# Patient Record
Sex: Male | Born: 1945 | Race: Black or African American | Hispanic: No | Marital: Single | Smoking: Current every day smoker
Health system: Southern US, Community
[De-identification: ages and names within clinical notes are randomized; demographics above are authoritative.]

## PROBLEM LIST (undated history)

## (undated) DIAGNOSIS — I4901 Ventricular fibrillation: Secondary | ICD-10-CM

## (undated) DIAGNOSIS — F99 Mental disorder, not otherwise specified: Secondary | ICD-10-CM

## (undated) DIAGNOSIS — I5022 Chronic systolic (congestive) heart failure: Secondary | ICD-10-CM

## (undated) DIAGNOSIS — Z5900 Homelessness unspecified: Secondary | ICD-10-CM

## (undated) DIAGNOSIS — I10 Essential (primary) hypertension: Secondary | ICD-10-CM

## (undated) DIAGNOSIS — E785 Hyperlipidemia, unspecified: Secondary | ICD-10-CM

## (undated) DIAGNOSIS — I4891 Unspecified atrial fibrillation: Secondary | ICD-10-CM

## (undated) DIAGNOSIS — I2510 Atherosclerotic heart disease of native coronary artery without angina pectoris: Secondary | ICD-10-CM

## (undated) DIAGNOSIS — F1910 Other psychoactive substance abuse, uncomplicated: Secondary | ICD-10-CM

## (undated) DIAGNOSIS — I255 Ischemic cardiomyopathy: Secondary | ICD-10-CM

## (undated) HISTORY — DX: Hyperlipidemia, unspecified: E78.5

## (undated) HISTORY — DX: Atherosclerotic heart disease of native coronary artery without angina pectoris: I25.10

## (undated) HISTORY — DX: Homelessness unspecified: Z59.00

## (undated) HISTORY — DX: Chronic systolic (congestive) heart failure: I50.22

## (undated) HISTORY — DX: Essential (primary) hypertension: I10

## (undated) HISTORY — DX: Ventricular fibrillation: I49.01

## (undated) HISTORY — DX: Unspecified atrial fibrillation: I48.91

## (undated) HISTORY — DX: Mental disorder, not otherwise specified: F99

## (undated) HISTORY — DX: Homelessness: Z59.0

---

## 1999-12-20 ENCOUNTER — Emergency Department (HOSPITAL_COMMUNITY): Admission: EM | Admit: 1999-12-20 | Discharge: 1999-12-20 | Payer: Self-pay | Admitting: Emergency Medicine

## 2005-07-30 ENCOUNTER — Emergency Department (HOSPITAL_COMMUNITY): Admission: EM | Admit: 2005-07-30 | Discharge: 2005-07-30 | Payer: Self-pay | Admitting: Emergency Medicine

## 2005-09-07 ENCOUNTER — Emergency Department (HOSPITAL_COMMUNITY): Admission: EM | Admit: 2005-09-07 | Discharge: 2005-09-07 | Payer: Self-pay | Admitting: Emergency Medicine

## 2005-09-16 ENCOUNTER — Emergency Department (HOSPITAL_COMMUNITY): Admission: EM | Admit: 2005-09-16 | Discharge: 2005-09-16 | Payer: Self-pay | Admitting: Emergency Medicine

## 2005-10-22 ENCOUNTER — Encounter: Admission: RE | Admit: 2005-10-22 | Discharge: 2006-01-20 | Payer: Self-pay | Admitting: Family Medicine

## 2007-06-08 ENCOUNTER — Emergency Department (HOSPITAL_COMMUNITY): Admission: EM | Admit: 2007-06-08 | Discharge: 2007-06-08 | Payer: Self-pay | Admitting: Emergency Medicine

## 2009-07-20 ENCOUNTER — Emergency Department (HOSPITAL_COMMUNITY): Admission: EM | Admit: 2009-07-20 | Discharge: 2009-07-20 | Payer: Self-pay | Admitting: Emergency Medicine

## 2011-10-09 LAB — URINALYSIS, ROUTINE W REFLEX MICROSCOPIC
Bilirubin Urine: NEGATIVE
Glucose, UA: NEGATIVE
Hgb urine dipstick: NEGATIVE
Ketones, ur: NEGATIVE
Nitrite: NEGATIVE
Protein, ur: NEGATIVE
Specific Gravity, Urine: 1.008
pH: 6

## 2013-03-23 ENCOUNTER — Emergency Department (HOSPITAL_COMMUNITY)
Admission: EM | Admit: 2013-03-23 | Discharge: 2013-03-23 | Discharge status: Against Medical Advice | Payer: Medicare Other | Attending: Emergency Medicine | Admitting: Emergency Medicine

## 2013-03-23 ENCOUNTER — Encounter (HOSPITAL_COMMUNITY): Payer: Self-pay | Admitting: Emergency Medicine

## 2013-03-23 DIAGNOSIS — M25579 Pain in unspecified ankle and joints of unspecified foot: Secondary | ICD-10-CM | POA: Insufficient documentation

## 2013-03-23 DIAGNOSIS — L97509 Non-pressure chronic ulcer of other part of unspecified foot with unspecified severity: Secondary | ICD-10-CM | POA: Insufficient documentation

## 2013-03-23 DIAGNOSIS — IMO0002 Reserved for concepts with insufficient information to code with codable children: Secondary | ICD-10-CM | POA: Insufficient documentation

## 2013-03-23 NOTE — ED Notes (Signed)
Pt left without being seen.

## 2013-03-23 NOTE — ED Notes (Signed)
Attempted to get vitals and pt stated "Ill leave man Ill leave."

## 2013-03-23 NOTE — ED Notes (Signed)
Pt refuses to give history.  Pt is restless, agitated. C/o pain in bilateral lower extremities. Ulcer noted on right great toe.

## 2013-03-23 NOTE — ED Notes (Signed)
Pt was picked up by EMS on side of road. C/o foot pain.

## 2014-04-11 ENCOUNTER — Ambulatory Visit: Payer: Self-pay | Admitting: Podiatry

## 2014-10-27 ENCOUNTER — Other Ambulatory Visit: Payer: Self-pay

## 2014-10-27 ENCOUNTER — Encounter (HOSPITAL_COMMUNITY): Payer: Self-pay

## 2014-10-27 ENCOUNTER — Emergency Department (HOSPITAL_COMMUNITY): Payer: Medicare Other

## 2014-10-27 ENCOUNTER — Inpatient Hospital Stay (HOSPITAL_COMMUNITY): Payer: Medicare Other

## 2014-10-27 ENCOUNTER — Inpatient Hospital Stay (HOSPITAL_COMMUNITY)
Admission: EM | Admit: 2014-10-27 | Discharge: 2014-11-05 | DRG: 250 | Disposition: A | Discharge status: Skilled Nursing Facility | Payer: Medicare Other | Attending: Internal Medicine | Admitting: Internal Medicine

## 2014-10-27 ENCOUNTER — Other Ambulatory Visit (HOSPITAL_COMMUNITY): Payer: Self-pay

## 2014-10-27 ENCOUNTER — Encounter (HOSPITAL_COMMUNITY)
Admission: EM | Disposition: A | Discharge status: Skilled Nursing Facility | Payer: Medicare Other | Source: Home / Self Care | Attending: Cardiology

## 2014-10-27 DIAGNOSIS — Z8674 Personal history of sudden cardiac arrest: Secondary | ICD-10-CM

## 2014-10-27 DIAGNOSIS — R7303 Prediabetes: Secondary | ICD-10-CM

## 2014-10-27 DIAGNOSIS — I255 Ischemic cardiomyopathy: Secondary | ICD-10-CM | POA: Diagnosis present

## 2014-10-27 DIAGNOSIS — W1839XA Other fall on same level, initial encounter: Secondary | ICD-10-CM | POA: Diagnosis present

## 2014-10-27 DIAGNOSIS — E876 Hypokalemia: Secondary | ICD-10-CM

## 2014-10-27 DIAGNOSIS — R740 Nonspecific elevation of levels of transaminase and lactic acid dehydrogenase [LDH]: Secondary | ICD-10-CM

## 2014-10-27 DIAGNOSIS — F1410 Cocaine abuse, uncomplicated: Secondary | ICD-10-CM | POA: Diagnosis not present

## 2014-10-27 DIAGNOSIS — E870 Hyperosmolality and hypernatremia: Secondary | ICD-10-CM | POA: Diagnosis present

## 2014-10-27 DIAGNOSIS — I2582 Chronic total occlusion of coronary artery: Secondary | ICD-10-CM | POA: Diagnosis not present

## 2014-10-27 DIAGNOSIS — I2111 ST elevation (STEMI) myocardial infarction involving right coronary artery: Secondary | ICD-10-CM

## 2014-10-27 DIAGNOSIS — D62 Acute posthemorrhagic anemia: Secondary | ICD-10-CM | POA: Diagnosis not present

## 2014-10-27 DIAGNOSIS — Y92230 Patient room in hospital as the place of occurrence of the external cause: Secondary | ICD-10-CM

## 2014-10-27 DIAGNOSIS — E785 Hyperlipidemia, unspecified: Secondary | ICD-10-CM | POA: Diagnosis present

## 2014-10-27 DIAGNOSIS — R451 Restlessness and agitation: Secondary | ICD-10-CM | POA: Diagnosis present

## 2014-10-27 DIAGNOSIS — I10 Essential (primary) hypertension: Secondary | ICD-10-CM | POA: Insufficient documentation

## 2014-10-27 DIAGNOSIS — J9690 Respiratory failure, unspecified, unspecified whether with hypoxia or hypercapnia: Secondary | ICD-10-CM

## 2014-10-27 DIAGNOSIS — R410 Disorientation, unspecified: Secondary | ICD-10-CM | POA: Diagnosis not present

## 2014-10-27 DIAGNOSIS — K7200 Acute and subacute hepatic failure without coma: Secondary | ICD-10-CM | POA: Diagnosis not present

## 2014-10-27 DIAGNOSIS — Z9119 Patient's noncompliance with other medical treatment and regimen: Secondary | ICD-10-CM | POA: Diagnosis not present

## 2014-10-27 DIAGNOSIS — K567 Ileus, unspecified: Secondary | ICD-10-CM | POA: Diagnosis not present

## 2014-10-27 DIAGNOSIS — J9601 Acute respiratory failure with hypoxia: Secondary | ICD-10-CM | POA: Diagnosis present

## 2014-10-27 DIAGNOSIS — I213 ST elevation (STEMI) myocardial infarction of unspecified site: Secondary | ICD-10-CM

## 2014-10-27 DIAGNOSIS — E873 Alkalosis: Secondary | ICD-10-CM | POA: Diagnosis not present

## 2014-10-27 DIAGNOSIS — I4901 Ventricular fibrillation: Secondary | ICD-10-CM | POA: Diagnosis present

## 2014-10-27 DIAGNOSIS — J690 Pneumonitis due to inhalation of food and vomit: Secondary | ICD-10-CM | POA: Diagnosis not present

## 2014-10-27 DIAGNOSIS — I5043 Acute on chronic combined systolic (congestive) and diastolic (congestive) heart failure: Secondary | ICD-10-CM | POA: Diagnosis not present

## 2014-10-27 DIAGNOSIS — Z6820 Body mass index (BMI) 20.0-20.9, adult: Secondary | ICD-10-CM | POA: Diagnosis not present

## 2014-10-27 DIAGNOSIS — R509 Fever, unspecified: Secondary | ICD-10-CM

## 2014-10-27 DIAGNOSIS — R739 Hyperglycemia, unspecified: Secondary | ICD-10-CM | POA: Diagnosis present

## 2014-10-27 DIAGNOSIS — I480 Paroxysmal atrial fibrillation: Secondary | ICD-10-CM | POA: Insufficient documentation

## 2014-10-27 DIAGNOSIS — J811 Chronic pulmonary edema: Secondary | ICD-10-CM

## 2014-10-27 DIAGNOSIS — I469 Cardiac arrest, cause unspecified: Secondary | ICD-10-CM | POA: Insufficient documentation

## 2014-10-27 DIAGNOSIS — Z87891 Personal history of nicotine dependence: Secondary | ICD-10-CM | POA: Diagnosis not present

## 2014-10-27 DIAGNOSIS — R64 Cachexia: Secondary | ICD-10-CM | POA: Diagnosis not present

## 2014-10-27 DIAGNOSIS — G931 Anoxic brain damage, not elsewhere classified: Secondary | ICD-10-CM | POA: Diagnosis not present

## 2014-10-27 DIAGNOSIS — R7401 Elevation of levels of liver transaminase levels: Secondary | ICD-10-CM

## 2014-10-27 DIAGNOSIS — E43 Unspecified severe protein-calorie malnutrition: Secondary | ICD-10-CM | POA: Insufficient documentation

## 2014-10-27 DIAGNOSIS — R109 Unspecified abdominal pain: Secondary | ICD-10-CM | POA: Insufficient documentation

## 2014-10-27 DIAGNOSIS — I4891 Unspecified atrial fibrillation: Secondary | ICD-10-CM

## 2014-10-27 DIAGNOSIS — Z9861 Coronary angioplasty status: Secondary | ICD-10-CM

## 2014-10-27 DIAGNOSIS — G9349 Other encephalopathy: Secondary | ICD-10-CM | POA: Diagnosis not present

## 2014-10-27 DIAGNOSIS — I2119 ST elevation (STEMI) myocardial infarction involving other coronary artery of inferior wall: Secondary | ICD-10-CM | POA: Diagnosis present

## 2014-10-27 DIAGNOSIS — I2510 Atherosclerotic heart disease of native coronary artery without angina pectoris: Secondary | ICD-10-CM | POA: Diagnosis not present

## 2014-10-27 DIAGNOSIS — Z4659 Encounter for fitting and adjustment of other gastrointestinal appliance and device: Secondary | ICD-10-CM

## 2014-10-27 DIAGNOSIS — G9340 Encephalopathy, unspecified: Secondary | ICD-10-CM

## 2014-10-27 DIAGNOSIS — I5042 Chronic combined systolic (congestive) and diastolic (congestive) heart failure: Secondary | ICD-10-CM

## 2014-10-27 DIAGNOSIS — J189 Pneumonia, unspecified organism: Secondary | ICD-10-CM | POA: Insufficient documentation

## 2014-10-27 HISTORY — PX: LEFT HEART CATHETERIZATION WITH CORONARY ANGIOGRAM: SHX5451

## 2014-10-27 LAB — RAPID URINE DRUG SCREEN, HOSP PERFORMED
Amphetamines: NOT DETECTED
BARBITURATES: NOT DETECTED
BENZODIAZEPINES: POSITIVE — AB
COCAINE: POSITIVE — AB
OPIATES: NOT DETECTED
Tetrahydrocannabinol: NOT DETECTED

## 2014-10-27 LAB — POCT I-STAT 3, ART BLOOD GAS (G3+)
Acid-Base Excess: 3 mmol/L — ABNORMAL HIGH (ref 0.0–2.0)
Bicarbonate: 29.4 mEq/L — ABNORMAL HIGH (ref 20.0–24.0)
O2 SAT: 100 %
PH ART: 7.343 — AB (ref 7.350–7.450)
TCO2: 31 mmol/L (ref 0–100)
pCO2 arterial: 54.1 mmHg — ABNORMAL HIGH (ref 35.0–45.0)
pO2, Arterial: 532 mmHg — ABNORMAL HIGH (ref 80.0–100.0)

## 2014-10-27 LAB — BASIC METABOLIC PANEL
ANION GAP: 13 (ref 5–15)
ANION GAP: 12 (ref 5–15)
Anion gap: 13 (ref 5–15)
Anion gap: 19 — ABNORMAL HIGH (ref 5–15)
BUN: 11 mg/dL (ref 6–23)
BUN: 11 mg/dL (ref 6–23)
BUN: 10 mg/dL (ref 6–23)
BUN: 10 mg/dL (ref 6–23)
CALCIUM: 9.1 mg/dL (ref 8.4–10.5)
CALCIUM: 9 mg/dL (ref 8.4–10.5)
CALCIUM: 8.6 mg/dL (ref 8.4–10.5)
CHLORIDE: 102 meq/L (ref 96–112)
CO2: 24 mEq/L (ref 19–32)
CO2: 22 mEq/L (ref 19–32)
CO2: 25 meq/L (ref 19–32)
CO2: 24 meq/L (ref 19–32)
CREATININE: 0.79 mg/dL (ref 0.50–1.35)
CREATININE: 0.81 mg/dL (ref 0.50–1.35)
CREATININE: 0.68 mg/dL (ref 0.50–1.35)
Calcium: 8.2 mg/dL — ABNORMAL LOW (ref 8.4–10.5)
Chloride: 105 mEq/L (ref 96–112)
Chloride: 100 mEq/L (ref 96–112)
Chloride: 102 mEq/L (ref 96–112)
Creatinine, Ser: 0.74 mg/dL (ref 0.50–1.35)
GFR calc Af Amer: >90 mL/min (ref >90)
GFR calc non Af Amer: >90 mL/min (ref >90)
Glucose, Bld: 159 mg/dL — ABNORMAL HIGH (ref 70–99)
Glucose, Bld: 164 mg/dL — ABNORMAL HIGH (ref 70–99)
Glucose, Bld: 161 mg/dL — ABNORMAL HIGH (ref 70–99)
Glucose, Bld: 138 mg/dL — ABNORMAL HIGH (ref 70–99)
Potassium: 3.4 mEq/L — ABNORMAL LOW (ref 3.7–5.3)
Potassium: 3.9 mEq/L (ref 3.7–5.3)
Potassium: 3.8 mEq/L (ref 3.7–5.3)
Potassium: 3.8 mEq/L (ref 3.7–5.3)
Sodium: 142 mEq/L (ref 137–147)
Sodium: 141 mEq/L (ref 137–147)
Sodium: 140 mEq/L (ref 137–147)
Sodium: 138 mEq/L (ref 137–147)

## 2014-10-27 LAB — PROTIME-INR
INR: 1.12 (ref 0.00–1.49)
INR: 1.49 (ref 0.00–1.49)
INR: 1.11 (ref 0.00–1.49)
PROTHROMBIN TIME: 14.4 s (ref 11.6–15.2)
Prothrombin Time: 14.5 seconds (ref 11.6–15.2)
Prothrombin Time: 18.1 seconds — ABNORMAL HIGH (ref 11.6–15.2)

## 2014-10-27 LAB — COMPREHENSIVE METABOLIC PANEL
ALBUMIN: 3.7 g/dL (ref 3.5–5.2)
ALK PHOS: 103 U/L (ref 39–117)
ALT: 108 U/L — AB (ref 0–53)
ANION GAP: 22 — AB (ref 5–15)
AST: 147 U/L — ABNORMAL HIGH (ref 0–37)
BILIRUBIN TOTAL: 1.1 mg/dL (ref 0.3–1.2)
BUN: 12 mg/dL (ref 6–23)
CHLORIDE: 103 meq/L (ref 96–112)
CO2: 18 mEq/L — ABNORMAL LOW (ref 19–32)
Calcium: 9.2 mg/dL (ref 8.4–10.5)
Creatinine, Ser: 0.96 mg/dL (ref 0.50–1.35)
Glucose, Bld: 130 mg/dL — ABNORMAL HIGH (ref 70–99)
POTASSIUM: 3.4 meq/L — AB (ref 3.7–5.3)
SODIUM: 143 meq/L (ref 137–147)
Total Protein: 7.1 g/dL (ref 6.0–8.3)

## 2014-10-27 LAB — POCT I-STAT, CHEM 8
BUN: 11 mg/dL (ref 6–23)
BUN: 8 mg/dL (ref 6–23)
BUN: 7 mg/dL (ref 6–23)
CHLORIDE: 111 meq/L (ref 96–112)
CREATININE: 0.8 mg/dL (ref 0.50–1.35)
CREATININE: 0.6 mg/dL (ref 0.50–1.35)
Calcium, Ion: 1.23 mmol/L (ref 1.13–1.30)
Calcium, Ion: 0.93 mmol/L — ABNORMAL LOW (ref 1.13–1.30)
Calcium, Ion: 0.94 mmol/L — ABNORMAL LOW (ref 1.13–1.30)
Chloride: 103 mEq/L (ref 96–112)
Chloride: 109 mEq/L (ref 96–112)
Creatinine, Ser: 0.6 mg/dL (ref 0.50–1.35)
GLUCOSE: 140 mg/dL — AB (ref 70–99)
Glucose, Bld: 150 mg/dL — ABNORMAL HIGH (ref 70–99)
Glucose, Bld: 133 mg/dL — ABNORMAL HIGH (ref 70–99)
HCT: 40 % (ref 39.0–52.0)
HEMATOCRIT: 40 % (ref 39.0–52.0)
HEMATOCRIT: 31 % — AB (ref 39.0–52.0)
HEMOGLOBIN: 13.6 g/dL (ref 13.0–17.0)
Hemoglobin: 10.5 g/dL — ABNORMAL LOW (ref 13.0–17.0)
Hemoglobin: 13.6 g/dL (ref 13.0–17.0)
POTASSIUM: 2.6 meq/L — AB (ref 3.7–5.3)
Potassium: 3.5 mEq/L — ABNORMAL LOW (ref 3.7–5.3)
Potassium: 2.5 mEq/L — CL (ref 3.7–5.3)
SODIUM: 142 meq/L (ref 137–147)
SODIUM: 148 meq/L — AB (ref 137–147)
Sodium: 145 mEq/L (ref 137–147)
TCO2: 26 mmol/L (ref 0–100)
TCO2: 20 mmol/L (ref 0–100)
TCO2: 18 mmol/L (ref 0–100)

## 2014-10-27 LAB — I-STAT TROPONIN, ED: Troponin i, poc: 0.05 ng/mL (ref 0.00–0.08)

## 2014-10-27 LAB — URINALYSIS, ROUTINE W REFLEX MICROSCOPIC
Bilirubin Urine: NEGATIVE
Glucose, UA: NEGATIVE mg/dL
KETONES UR: NEGATIVE mg/dL
Leukocytes, UA: NEGATIVE
Nitrite: NEGATIVE
PH: 6 (ref 5.0–8.0)
PROTEIN: 100 mg/dL — AB
Specific Gravity, Urine: 1.027 (ref 1.005–1.030)
Urobilinogen, UA: 1 mg/dL (ref 0.0–1.0)

## 2014-10-27 LAB — CBC
HEMATOCRIT: 40.4 % (ref 39.0–52.0)
HEMOGLOBIN: 13.4 g/dL (ref 13.0–17.0)
MCH: 28.8 pg (ref 26.0–34.0)
MCHC: 33.2 g/dL (ref 30.0–36.0)
MCV: 86.9 fL (ref 78.0–100.0)
Platelets: 197 10*3/uL (ref 150–400)
RBC: 4.65 MIL/uL (ref 4.22–5.81)
RDW: 14.1 % (ref 11.5–15.5)
WBC: 4.8 10*3/uL (ref 4.0–10.5)

## 2014-10-27 LAB — POCT I-STAT TROPONIN I: TROPONIN I, POC: 0.05 ng/mL (ref 0.00–0.08)

## 2014-10-27 LAB — URINE MICROSCOPIC-ADD ON

## 2014-10-27 LAB — TROPONIN I
TROPONIN I: 2.15 ng/mL — AB (ref <0.30)
Troponin I: 2.3 ng/mL (ref <0.30)

## 2014-10-27 LAB — APTT
APTT: 33 s (ref 24–37)
APTT: 84 s — AB (ref 24–37)
APTT: 40 s — AB (ref 24–37)

## 2014-10-27 LAB — PLATELET COUNT: Platelets: 219 10*3/uL (ref 150–400)

## 2014-10-27 LAB — LACTIC ACID, PLASMA: Lactic Acid, Venous: 1.9 mmol/L (ref 0.5–2.2)

## 2014-10-27 LAB — MRSA PCR SCREENING: MRSA by PCR: NEGATIVE

## 2014-10-27 LAB — POCT ACTIVATED CLOTTING TIME: Activated Clotting Time: 597 seconds

## 2014-10-27 SURGERY — LEFT HEART CATHETERIZATION WITH CORONARY ANGIOGRAM
Anesthesia: LOCAL

## 2014-10-27 MED ORDER — METOPROLOL TARTRATE 1 MG/ML IV SOLN
INTRAVENOUS | Status: AC
  Filled 2014-10-27: qty 5

## 2014-10-27 MED ORDER — CHLORHEXIDINE GLUCONATE 0.12 % MT SOLN
OROMUCOSAL | Status: AC
  Administered 2014-10-27: 15 mL
  Filled 2014-10-27: qty 15

## 2014-10-27 MED ORDER — CHLORHEXIDINE GLUCONATE 0.12 % MT SOLN
15.0000 mL | Freq: Two times a day (BID) | OROMUCOSAL | Status: DC
  Administered 2014-10-27 – 2014-10-30 (×7): 15 mL via OROMUCOSAL
  Filled 2014-10-27 (×7): qty 15

## 2014-10-27 MED ORDER — SODIUM CHLORIDE 0.9 % IV SOLN
25.0000 ug/h | INTRAVENOUS | Status: DC
  Administered 2014-10-27: 200 ug/h via INTRAVENOUS
  Administered 2014-10-28: 250 ug/h via INTRAVENOUS
  Administered 2014-10-29: 50 ug/h via INTRAVENOUS
  Filled 2014-10-27 (×3): qty 50

## 2014-10-27 MED ORDER — POTASSIUM CHLORIDE 2 MEQ/ML IV SOLN
INTRAVENOUS | Status: DC
  Administered 2014-10-27: 18:00:00 via INTRAVENOUS
  Filled 2014-10-27 (×4): qty 1000

## 2014-10-27 MED ORDER — NOREPINEPHRINE BITARTRATE 1 MG/ML IV SOLN
0.0000 ug/min | INTRAVENOUS | Status: DC
  Filled 2014-10-27: qty 16

## 2014-10-27 MED ORDER — FENTANYL BOLUS VIA INFUSION
50.0000 ug | INTRAVENOUS | Status: DC | PRN
  Filled 2014-10-27: qty 50

## 2014-10-27 MED ORDER — SODIUM CHLORIDE 0.9 % IV SOLN
2000.0000 mL | Freq: Once | INTRAVENOUS | Status: AC
  Administered 2014-10-27: 1000 mL via INTRAVENOUS

## 2014-10-27 MED ORDER — FENTANYL CITRATE 0.05 MG/ML IJ SOLN: 100.0000 ug | Freq: Once | INTRAMUSCULAR | Status: DC

## 2014-10-27 MED ORDER — HEPARIN (PORCINE) IN NACL 2-0.9 UNIT/ML-% IJ SOLN
INTRAMUSCULAR | Status: AC
  Filled 2014-10-27: qty 1500

## 2014-10-27 MED ORDER — SODIUM CHLORIDE 0.9 % IV SOLN
INTRAVENOUS | Status: DC
  Administered 2014-10-27: 14:00:00 via INTRAVENOUS

## 2014-10-27 MED ORDER — NITROGLYCERIN 1 MG/10 ML FOR IR/CATH LAB
INTRA_ARTERIAL | Status: AC
  Filled 2014-10-27: qty 10

## 2014-10-27 MED ORDER — ASPIRIN 300 MG RE SUPP
300.0000 mg | RECTAL | Status: AC
  Administered 2014-10-27: 300 mg via RECTAL
  Filled 2014-10-27: qty 1

## 2014-10-27 MED ORDER — ASPIRIN 81 MG PO CHEW: 81.0000 mg | CHEWABLE_TABLET | Freq: Every day | ORAL | Status: DC

## 2014-10-27 MED ORDER — HYDRALAZINE HCL 20 MG/ML IJ SOLN
10.0000 mg | INTRAMUSCULAR | Status: DC | PRN
  Administered 2014-10-27 – 2014-10-30 (×4): 10 mg via INTRAVENOUS
  Filled 2014-10-27 (×4): qty 1

## 2014-10-27 MED ORDER — TIROFIBAN HCL IV 12.5 MG/250 ML
INTRAVENOUS | Status: AC
  Administered 2014-10-28: 0.15 ug/kg/min via INTRAVENOUS
  Filled 2014-10-27: qty 250

## 2014-10-27 MED ORDER — MIDAZOLAM HCL 2 MG/2ML IJ SOLN
INTRAMUSCULAR | Status: AC
  Filled 2014-10-27: qty 2

## 2014-10-27 MED ORDER — ASPIRIN 300 MG RE SUPP
300.0000 mg | Freq: Once | RECTAL | Status: AC
  Administered 2014-10-27: 300 mg via RECTAL
  Filled 2014-10-27: qty 1

## 2014-10-27 MED ORDER — HYDRALAZINE HCL 20 MG/ML IJ SOLN
INTRAMUSCULAR | Status: AC
Start: 2014-10-27 — End: 2014-10-27
  Administered 2014-10-29: 10 mg via INTRAVENOUS
  Filled 2014-10-27: qty 1

## 2014-10-27 MED ORDER — BIVALIRUDIN 250 MG IV SOLR
INTRAVENOUS | Status: AC
  Filled 2014-10-27: qty 250

## 2014-10-27 MED ORDER — POTASSIUM CHLORIDE 10 MEQ/100ML IV SOLN
10.0000 meq | INTRAVENOUS | Status: AC
  Administered 2014-10-27 (×4): 10 meq via INTRAVENOUS
  Filled 2014-10-27 (×4): qty 100

## 2014-10-27 MED ORDER — NOREPINEPHRINE BITARTRATE 1 MG/ML IV SOLN
0.5000 ug/min | INTRAVENOUS | Status: DC
  Filled 2014-10-27: qty 4

## 2014-10-27 MED ORDER — CISATRACURIUM BESYLATE 10 MG/ML IV SOLN
1.0000 ug/kg/min | INTRAVENOUS | Status: DC
  Administered 2014-10-27 – 2014-10-28 (×2): 1.5 ug/kg/min via INTRAVENOUS
  Filled 2014-10-27 (×4): qty 20

## 2014-10-27 MED ORDER — FENTANYL CITRATE 0.05 MG/ML IJ SOLN
25.0000 ug/h | INTRAMUSCULAR | Status: DC
  Filled 2014-10-27: qty 50

## 2014-10-27 MED ORDER — ATORVASTATIN CALCIUM 80 MG PO TABS
80.0000 mg | ORAL_TABLET | Freq: Every day | ORAL | Status: DC
  Administered 2014-10-27: 80 mg via NASOGASTRIC
  Filled 2014-10-27: qty 1

## 2014-10-27 MED ORDER — MIDAZOLAM HCL 2 MG/2ML IJ SOLN
2.0000 mg | Freq: Once | INTRAMUSCULAR | Status: AC
  Administered 2014-10-27: 2 mg via INTRAVENOUS

## 2014-10-27 MED ORDER — SODIUM CHLORIDE 0.9 % IV SOLN: 2000.0000 mL | Freq: Once | INTRAVENOUS | Status: DC

## 2014-10-27 MED ORDER — CETYLPYRIDINIUM CHLORIDE 0.05 % MT LIQD
7.0000 mL | Freq: Four times a day (QID) | OROMUCOSAL | Status: DC
  Administered 2014-10-27 – 2014-10-30 (×12): 7 mL via OROMUCOSAL

## 2014-10-27 MED ORDER — FENTANYL CITRATE 0.05 MG/ML IJ SOLN: 100.0000 ug | INTRAMUSCULAR | Status: DC | PRN

## 2014-10-27 MED ORDER — TICAGRELOR 90 MG PO TABS: 90.0000 mg | ORAL_TABLET | Freq: Two times a day (BID) | ORAL | Status: DC

## 2014-10-27 MED ORDER — MIDAZOLAM HCL 5 MG/ML IJ SOLN
1.0000 mg/h | INTRAMUSCULAR | Status: DC
  Filled 2014-10-27: qty 10

## 2014-10-27 MED ORDER — PANTOPRAZOLE SODIUM 40 MG IV SOLR
40.0000 mg | Freq: Every day | INTRAVENOUS | Status: DC
  Administered 2014-10-27 – 2014-10-31 (×5): 40 mg via INTRAVENOUS
  Filled 2014-10-27 (×7): qty 40

## 2014-10-27 MED ORDER — TIROFIBAN HCL IV 5 MG/100ML
0.1500 ug/kg/min | INTRAVENOUS | Status: DC
  Administered 2014-10-27 – 2014-10-28 (×2): 0.15 ug/kg/min via INTRAVENOUS
  Filled 2014-10-27 (×4): qty 100

## 2014-10-27 MED ORDER — METOPROLOL TARTRATE 1 MG/ML IV SOLN
5.0000 mg | Freq: Three times a day (TID) | INTRAVENOUS | Status: DC
  Administered 2014-10-27 – 2014-10-28 (×3): 5 mg via INTRAVENOUS
  Filled 2014-10-27 (×3): qty 5

## 2014-10-27 MED ORDER — POTASSIUM CHLORIDE 10 MEQ/50ML IV SOLN: 10.0000 meq | INTRAVENOUS | Status: DC

## 2014-10-27 MED ORDER — MIDAZOLAM HCL 2 MG/2ML IJ SOLN: 2.0000 mg | Freq: Once | INTRAMUSCULAR | Status: DC

## 2014-10-27 MED ORDER — LIDOCAINE HCL (PF) 1 % IJ SOLN
INTRAMUSCULAR | Status: AC
  Filled 2014-10-27: qty 30

## 2014-10-27 MED ORDER — SODIUM CHLORIDE 0.9 % IV SOLN
1.0000 mg/h | INTRAVENOUS | Status: DC
  Administered 2014-10-27: 6 mg/h via INTRAVENOUS
  Administered 2014-10-27: 8 mg/h via INTRAVENOUS
  Administered 2014-10-28: 6 mg/h via INTRAVENOUS
  Filled 2014-10-27 (×4): qty 10

## 2014-10-27 MED ORDER — MIDAZOLAM BOLUS VIA INFUSION
2.0000 mg | INTRAVENOUS | Status: DC | PRN
  Filled 2014-10-27: qty 2

## 2014-10-27 MED ORDER — ARTIFICIAL TEARS OP OINT
1.0000 "application " | TOPICAL_OINTMENT | Freq: Three times a day (TID) | OPHTHALMIC | Status: DC
  Administered 2014-10-27 – 2014-10-28 (×4): 1 via OPHTHALMIC
  Filled 2014-10-27: qty 3.5

## 2014-10-27 MED ORDER — TICAGRELOR 90 MG PO TABS
180.0000 mg | ORAL_TABLET | Freq: Once | ORAL | Status: AC
  Administered 2014-10-27: 180 mg via NASOGASTRIC
  Filled 2014-10-27: qty 2

## 2014-10-27 MED ORDER — HEPARIN SODIUM (PORCINE) 5000 UNIT/ML IJ SOLN
4000.0000 [IU] | Freq: Once | INTRAMUSCULAR | Status: AC
  Administered 2014-10-27: 4000 [IU] via INTRAVENOUS
  Filled 2014-10-27: qty 1

## 2014-10-27 NOTE — ED Provider Notes (Signed)
Level V caveat history is obtained from EMS. Patient collapsed at home a shelter. Was without CPR for approximately 15 minutes. First responders perform CPR. AED was placed on patient which shocked him twice. Paramedics arrived to find patient in ventricular fibrillation. EMS intubated patient with 7.0 ET tube and deep fibrillated patient twice. He was placed on the [place] device, [place] device was not needed. He was also administered Versed 2 mg IV post intubation. On exam patient unresponsive Glasgow Coma Score 3 HEENT exam normocephalic atraumatic area eyes pupils pinpoint bilaterally neck supple positive JVD lungs clear auscultation heart regular rate and rhythm abdomen nondistended nontender all 4 extremities without tenderness, . Peripheral pulses intact. Skin warm dry. Neurologic Glasgow Coma Score 3. Unresponsive. Prehospital 12-lead EKG:  Date: 10/27/2014  Rate: 195  Rhythm: normal sinus rhythm  QRS Axis: normal  Intervals: normal  ST/T Wave abnormalities: ST elevations inferiorly  Conduction Disutrbances:none  Narrative Interpretation:   Old EKG Reviewed: none available  Code STEMI called. Aspirin rectally ordered 300 mg. IV bolus of heparin 4000 units ordered. Code hypothermia called as patient unresponsive Patient taken to the cardiac catheterization laboratory Diagnosis #1 cardiopulmonary arrest #2 acute inferior wall myocardial infarction(STEMI). CRITICAL CARE Performed by: Doug SouJACUBOWITZ,Ranjit Ashurst Total critical care time: 30 minute Critical care time was exclusive of separately billable procedures and treating other patients. Critical care was necessary to treat or prevent imminent or life-threatening deterioration. Critical care was time spent personally by me on the following activities: development of treatment plan with patient and/or surrogate as well as nursing, discussions with consultants, evaluation of patient's response to treatment, examination of patient, obtaining history from  patient or surrogate, ordering and performing treatments and interventions, ordering and review of laboratory studies, ordering and review of radiographic studies, pulse oximetry and re-evaluation of patient's condition.  Note:EDEKG:1324 pm  Date: 10/27/2014  Rate: 75  Rhythm: normal sinus rhythm  QRS Axis: normal  Intervals: normal  ST/T Wave abnormalities: nonspecific T wave changes  Conduction Disutrbances:none  Narrative Interpretation:   Old EKG Reviewed: ST segment elevation in prehospital 12-lead has resolved   Doug SouSam Ky Rumple, MD 10/27/14 1329

## 2014-10-27 NOTE — CV Procedure (Signed)
Cardiac Catheterization Operative Report  Jodc Doe [id number] 11/5/20153:18 PM No primary care provider on file.  Procedure Performed:  1. Left Heart Catheterization 2. Selective Coronary Angiography 3. Left ventricular angiogram 4. PTCA with balloon angioplasty only of the mid and distal RCA 5. Angioseal Right femoral artery  Operator: Verne Carrowhristopher Kaelee Pfeffer, MD  Indication:  ? Yo male, homeless with no known pmh or family contacts Jonny Ruiz(John Doe) with ventricular fibrillation arrest at the homeless shelter today. EMS arrived and performed one shock with return to sinus. ST elevation on EKG in ED. Emergent cath.                                 Procedure Details: Emergency consent obtained. The patient was further sedated with Versed and Fentanyl. Paralytics given. Pt intubated in field. The right groin was prepped and draped in the usual manner. Using the modified Seldinger access technique, a 6 French sheath was placed in the right femoral artery. Standard diagnostic catheters were used to perform selective coronary angiography. RCA with sub-total distal occlusion felt to be culprit lesion. I elected to proceed to PCI of the RCA.   PCI Note: Angiomax weight based bolus given and drip started. Aggrastat bolus given and drip started. JR4 guide to engage RCA. Cougar IC wire down RCA but could not cross the distal lesion. Whisper wire down the RCA and into the PDA. 2.0 x 15 mm balloon into the mid vessel and inflated x 2 but balloon would not expand due to calcification. I could not pass the balloon into the distal vessel. I then passed a 1.5 x 12 mm balloon into the distal vessel and inflated x 3. I could not pass a 2.0 balloon into the distal vessel. I then placed a [place] guide extension device into the proximal RCA and used the 2.0 balloon to try and pull the Guidezilla down into the proximal vessel. I was unable to deliver the balloon even with the support of the [place]. I had flow  into the distal vessel and stopped the case. I was not able to deliver any stents due to the severe tortuosity and calcification of the vessel.    A pigtail catheter was used to perform a left ventricular angiogram. There were no immediate complications. The patient was taken to the CCU in stable condition.   Hemodynamic Findings: Central aortic pressure: 180/108 Left ventricular pressure: 177/3/10  Angiographic Findings:  Left main: No obstructive disease.   Left Anterior Descending Artery: Large caliber vessel that courses to the apex. Several diagonal branches. Mild diffuse disease. No focally obstructive disease.   Circumflex Artery: Large vessel with large OM branch. Mild diffuse disease.   Right Coronary Artery: Large dominant vessel with diffuse heavy calcification in the proximal, mid and distal vessel. The proximal vessel has serial 50% stenoses. The mid vessel has diffuse 95% stenosis. The distal vessel has a 99% sub-total occlusion. The PDA fills faintly from antegrade flow and is also seen to fill from left to right collaterals.   Left Ventricular Angiogram: LVEF=25-30% with inferior hypokinesis.   Impression: 1. Inferior STEMI secondary to occluded distal RCA 2. Out of hospital cardiac arrest (venticular fibrillation) 3. Diffuse heavy calcification in the entire RCA with multiple segments of severe stenosis.  4. Severe LV systolic dysfunction  Recommendations: He will be managed in the CCU. To head CT now per PCCM. If no IC bleeding, plans for cooling  protocol. In regards to his cardiac disease, will run Aggrastat drip for 18 hours. Will load Brilinta 180 mg x 1 per NG tube. Will start Brilinta 90 mg po BID after that with first dose tonight. Will start statin, beta blocker (tachycardia and HTN). Continue ASA. Echo tomorrow. Ventilator management per PCCM. Long term plans for management of the RCA are complex. I was able to restore antegrade flow down the vessel with balloon  inflations but could not fully inflate balloons in the mid segment due to heavy calcification. If he has full neurological recovery, can consider PCI of the RCA with rotablator atherectomy, however, the downside to this would be the need for a very long segment of overlapping stents in a patient who at this time may not have the ability to remain compliant with medical therapy. This would be high risk for stent thrombosis.        Complications:  None. The patient tolerated the procedure well.

## 2014-10-27 NOTE — ED Notes (Signed)
Called [facility] to activate code stemi  

## 2014-10-27 NOTE — Progress Notes (Signed)
ETT advance 1 cm to 27 per MD order

## 2014-10-27 NOTE — ED Notes (Signed)
Per [facility], pt was witnessed fall at homeless shelter and went unresponsive. Fire arrived and started CPR. Pt shocked 2x with AED and twice with EMS. Pt was in vfib on arrival by EMS. Has been in SR since being shocked. Was given 2.5 mg versed. IO to left tibia. 140/90 and 35 capnography.

## 2014-10-27 NOTE — Progress Notes (Signed)
eLink Physician-Brief Progress Note Patient Name: [name] DOB: 12/23/1873 MRN: [id number]   Date of Service  10/27/2014  HPI/Events of Note   BP 217/124.   eICU Interventions  Will order prn hydralazine IV.     Intervention Category Major Interventions: Other:  Abrar Bilton 10/27/2014, 4:42 PM

## 2014-10-27 NOTE — ED Provider Notes (Signed)
CSN: [id number]     Arrival date & time 10/27/14  1310 History   First MD Initiated Contact with Patient 10/27/14 1319     Chief Complaint  Patient presents with  . Code STEMI    Patient is a [age] y.o. unknown presenting with altered mental status. The history is provided by the EMS personnel.  Altered Mental Status Presenting symptoms: unresponsiveness   Severity:  Severe Most recent episode:  Today Episode history:  Single Timing:  Constant Progression:  Unchanged Chronicity:  New Context comment:  Unknown Associated symptoms comment:  Syncope, V-fib arrest   Unknown past medical history  Unknown past surgical history  Unknown family history  History  Substance Use Topics  . Smoking status: Unknown If Ever Smoked  . Smokeless tobacco: Not on file  . Alcohol Use: Not on file   OB History    No data available     Review of Systems  Unable to perform ROS: Patient unresponsive    Allergies  Review of patient's allergies indicates not on file.  Home Medications   Prior to Admission medications   Not on File   BP 120/86 mmHg  Resp 16  SpO2 100%   Physical Exam  Constitutional: He appears well-developed and well-nourished. He is intubated. Backboard in place.  HENT:  Head: Normocephalic and atraumatic.  Right Ear: External ear normal.  Left Ear: External ear normal.  Nose: Nose normal.  Cardiovascular: Normal rate and regular rhythm.   Pulmonary/Chest: Effort normal and breath sounds normal. He is intubated. No respiratory distress. He has no wheezes.  Mechanically ventilated  Abdominal: Soft. He exhibits no distension.  Neurological: He is unresponsive.  Moving mouth on ETT; moving RUE spontaneously; coughing with deep suction  Skin: Skin is warm and dry. No rash noted.    ED Course  Procedures  Labs Review Labs Reviewed  CULTURE, BLOOD (ROUTINE X 2)  CULTURE, BLOOD (ROUTINE X 2)  URINE CULTURE  CULTURE, RESPIRATORY (NON-EXPECTORATED)  APTT  CBC   COMPREHENSIVE METABOLIC PANEL  PROTIME-INR  BLOOD GAS, ARTERIAL  BASIC METABOLIC PANEL  BASIC METABOLIC PANEL  BASIC METABOLIC PANEL  BASIC METABOLIC PANEL  BASIC METABOLIC PANEL  BASIC METABOLIC PANEL  BASIC METABOLIC PANEL  PROTIME-INR  PROTIME-INR  APTT  APTT  CORTISOL  URINALYSIS, ROUTINE W REFLEX MICROSCOPIC  STREP PNEUMONIAE URINARY ANTIGEN  LEGIONELLA ANTIGEN, URINE  PROCALCITONIN  LACTIC ACID, PLASMA  I-STAT TROPOININ, ED  POCT I-STAT TROPONIN I  I-STAT ARTERIAL BLOOD GAS, ED    Imaging Review Dg Chest Portable 1 View  10/27/2014   CLINICAL DATA:  Weakness cardiac arrest.  Unresponsive.  EXAM: PORTABLE CHEST - 1 VIEW  COMPARISON:  None.  FINDINGS: Endotracheal tube with tip near the clavicular heads. An orogastric tube reaches the stomach.  There is no edema, consolidation, effusion, or pneumothorax. Thin line left lateral to the descending thoracic aorta favors minimal atelectasis. No convincing pneumomediastinum.  No visible rib fracture.  IMPRESSION: 1. Endotracheal and orogastric tubes are in good position. 2. No cardiomegaly or pulmonary edema.   Electronically Signed   By: Tiburcio PeaJonathan  Watts M.D.   On: 10/27/2014 13:48    Pre-Hospital EKG showed inferior STEMI in II, III, aVF with reciprocal changes   MDM   Final diagnoses:  STEMI (ST elevation myocardial infarction)    Unknown age male, appears elderly, presents to the ED post V-fib arrest and cardiopulmonary resuscitation. Received a total of 4 cardioversions. Pre-hospital EKG showed inferior STEMI. Intubated in the  field. Code STEMI called. Rectal asa and heparin ordered. He was taken emergently to the cath lab. His hemodynamics remained acceptable in the ED. He was given a dose of Versed for sedation.   This case managed in conjunction with my attending, Dr. Ethelda ChickJacubowitz.     Maxine GlennAnn Yailene Badia, MD 10/27/14 1429  Doug SouSam Jacubowitz, MD 10/27/14 1645  Doug SouSam Jacubowitz, MD 10/27/14 [id number]

## 2014-10-27 NOTE — Consult Note (Signed)
      Jodc Doe is an [age] y.o. unknown.   Chief Complaint: VF arrest HPI:   The patient is a thin AA male with unknown medical history who had a witnessed arrest in a homeless shelter.  He was down for 5-10 minutes prior to CPR and defibrillation.  He was shocked a total of four times.  Intubated and on a vent currently.  EKG shows inferior ST elevation with reciprocal changes in V3-5     PMH:  Not able to obtain due to medical condition   Family history:  Not able to obtain due to medical condition  Social History:  Not able to obtain due to medical condition  Allergies: Not on File   (Not in a hospital admission)  No results found for this or any previous visit (from the past 48 hour(s)). No results found.  Review of Systems  Unable to perform ROS: medical condition    Blood pressure 110/76, pulse 83, temperature 96.9 F (36.1 C), temperature source Temporal, resp. rate 16, SpO2 100 %. Physical Exam  Constitutional:  Thin appearing AA male  Cardiovascular: Normal rate, regular rhythm, S1 normal and S2 normal.   No murmur heard. Pulses:      Radial pulses are 2+ on the right side, and 2+ on the left side.       Dorsalis pedis pulses are 2+ on the right side, and 2+ on the left side.  Respiratory: Effort normal and breath sounds normal. He has no rales.  GI: Soft. Bowel sounds are normal.  Musculoskeletal: He exhibits no edema.  Neurological:  Intubated and sedated  Skin: Skin is warm and dry.     Assessment/Plan VF Cardiac arrest EKG with ST changes consistent with myocardial injury.  The patient was taken emergently to the cath lab.   STEMI   HAGER, BRYAN, PAC 10/27/2014, 1:36 PM  Personally seen and examined. Agree with above.  Male unknown age, likely in his 2360s/70s who had witnessed cardiac arrest at homeless shelter. EMS was called immediately and responded within 5-10 minutes and AED demonstrated ventricular fibrillation and he was defibrillated twice.  He was brought to the emergency department for further evaluation where EKG post arrest demonstrated ST segment elevation in the inferior leads with deep T-wave inversion in V4 through V6. Because of this, he was brought to the catheterization lab for further evaluation. In the emergency room, blood pressure in the 120s, he was spontaneously moving his arms slightly. Biting down on ET tube. Unfortunately, he does not have any information identifying himself.  Critical care team has been consult as well. We will place on cooling protocol.  After catheterization, we'll place on secondary prevention medication, beta blocker, statin, aspirin, Plavix as indicated.  Echocardiogram to check ejection fraction. Brief bedside echocardiogram performed in the emergency department appear to demonstrate normal ejection fraction.  Critical care time 40 minutes spent with communication between emergency room physician, EMS, interventional cardiology with ongoing stabilization of patient prior to cardiac catheterization.  Donato SchultzSKAINS, Dj Senteno, MD

## 2014-10-27 NOTE — Progress Notes (Signed)
Manual BP results of 180/100 reported to Dr Craige CottaSood.  PRN hydralazine given per MAR.  Dr. Craige CottaSood also notified of unsuccessful attempts of radial A-line placement by RT.

## 2014-10-27 NOTE — Progress Notes (Signed)
Rt tried with mulitple attempts to obtain arterial line with no success. RN notifed MD

## 2014-10-27 NOTE — Progress Notes (Signed)
Rec'd pt in cath lab on vent from ED RT.  Pt taken to CT scan via vent, no apparent complications noted.  Unit RT aware.

## 2014-10-27 NOTE — ED Notes (Signed)
Cardiology at the bedside.

## 2014-10-27 NOTE — Progress Notes (Signed)
E-link notified about large amount of urine output of since 1630.

## 2014-10-27 NOTE — Progress Notes (Signed)
Dr. Craige CottaSood notified of hypertension and the need for placement of arterial line.  Order received to take manual BP and report results.

## 2014-10-27 NOTE — Consult Note (Signed)
PULMONARY / CRITICAL CARE MEDICINE   Name: [name] MRN: [id number] DOB: 12/23/1873    ADMISSION DATE:  10/27/2014   REFERRING MD :  EDP  INITIAL PRESENTATION:  Admitted as [name] after V fib arrest due to STEMI, intubated, post ACLS inc DCCV, emergent cath    SIGNIFICANT EVENTS/STUDIES: 11/05 Vfib arrest with 5 minutes down  11/05 LHC: Inferior STEMI secondary to occluded distal RCA. Diffuse heavy calcification in the entire RCA with multiple segments of severe stenosis. Severe LV systolic dysfunction 11/05 CT head: NAD  INDWELLING DEVICES:: ETT 11/05 >>   MICRO DATA: Urine 11/05 >>  Resp 11/05  >>  Blood 11/05 >>    ANTIMICROBIALS:     HISTORY OF PRESENT ILLNESS:  [name] who walked into homeless shelter 11/5 and had witnessed arrest. Fire reported on scene in 5 minutes with CPR and AED shock x 4 for Vfib with ROSC. [facility] and rushed to Cath lab for urgent LHC for inf/lateral STEMI. PCCM asked to admit.  PAST MEDICAL HISTORY :  N/A  Not on FileNA  FAMILY HISTORY:  NA  SOCIAL HISTORY:  NA  REVIEW OF SYSTEMS:  NA  SUBJECTIVE:   VITAL SIGNS: Temp:  [96.9 F (36.1 C)] 96.9 F (36.1 C) (11/05 1319) Pulse Rate:  [83] 83 (11/05 1330) Resp:  [16] 16 (11/05 1330) BP: (110-120)/(76-86) 110/76 mmHg (11/05 1330) SpO2:  [100 %] 100 % (11/05 1330) FiO2 (%):  [100 %] 100 % (11/05 1319) Weight:  [154 lb 5.2 oz (70 kg)] 154 lb 5.2 oz (70 kg) (11/05 1330) HEMODYNAMICS:   VENTILATOR SETTINGS: Vent Mode:  [-] PRVC FiO2 (%):  [100 %] 100 % Set Rate:  [16 bmp] 16 bmp Vt Set:  [782[620 mL] 620 mL PEEP:  [5 cmH20] 5 cmH20 Plateau Pressure:  [14 cmH20] 14 cmH20 INTAKE / OUTPUT: No intake or output data in the 24 hours ending 10/27/14 1352  PHYSICAL EXAMINATION: General:  Thin, intubated, sedated, paralyzed Neuro: Cannot perform due to NMB HEENT:  NCAT, PERRL Cardiovascular: REG, No M Lungs: clear Abdomen Soft, +BS Ext: warm, no  edema  LABS: I have reviewed all of today's lab results. Relevant abnormalities are discussed in the A/P section  CXR:  NACPD  ASSESSMENT / PLAN:  PULMONARY  A: VDRF post V fi arrest P:   Cont full vent support - settings reviewed and/or adjusted Cont vent bundle Daily SBT if/when meets criteria  CARDIOVASCULAR A:  VF arrest Inf STEMI RCA stenosis, could not be stented Hypertension, controlled P:  Cards managing MAP goal 85 mmHg per hypothermia protocol  RENAL A:   Hypernatremia hypokalemia P:   Monitor BMET intermittently Monitor I/Os Correct electrolytes as indicated D5W + KCl  GASTROINTESTINAL A:   No issues P:   SUP: IV PPI No nutrition until rewarmed  HEMATOLOGIC A:   Mild anemia without overt blood loss P:  DVT px: fully anticoagulated Monitor CBC intermittently Transfuse per usual ICU guidelines   INFECTIOUS A:   No overt infection P:   Micro and abx as above  ENDOCRINE A:  Mild hyperglycemia P:   CBGs per hyperglycemia protocol  NEUROLOGIC A:   Post anoxic acute encephalopathy ICU/vent associated discomfort P:   RASS goal: N/A while paralyzed   FAMILY  - Updates:   - Inter-disciplinary family meet or Palliative Care meeting due by:      TODAY'S SUMMARY:  40 mins CCM time  Scott Fischeravid Calene Paradiso, MD ; PCCM service  Mobile [phone].  After 5:30 PM or weekends, call [phone]  10/27/2014, 1:54 PM

## 2014-10-27 NOTE — Progress Notes (Signed)
Attempted to get Patient for EEG- nurse advised he will be getting [name] now; will follow up in the morning.

## 2014-10-28 ENCOUNTER — Encounter (HOSPITAL_COMMUNITY): Payer: Self-pay | Admitting: *Deleted

## 2014-10-28 ENCOUNTER — Other Ambulatory Visit (HOSPITAL_COMMUNITY): Payer: Self-pay

## 2014-10-28 DIAGNOSIS — I213 ST elevation (STEMI) myocardial infarction of unspecified site: Secondary | ICD-10-CM

## 2014-10-28 DIAGNOSIS — I059 Rheumatic mitral valve disease, unspecified: Secondary | ICD-10-CM

## 2014-10-28 DIAGNOSIS — I2111 ST elevation (STEMI) myocardial infarction involving right coronary artery: Secondary | ICD-10-CM

## 2014-10-28 DIAGNOSIS — J9690 Respiratory failure, unspecified, unspecified whether with hypoxia or hypercapnia: Secondary | ICD-10-CM | POA: Insufficient documentation

## 2014-10-28 DIAGNOSIS — J9600 Acute respiratory failure, unspecified whether with hypoxia or hypercapnia: Secondary | ICD-10-CM

## 2014-10-28 DIAGNOSIS — I2119 ST elevation (STEMI) myocardial infarction involving other coronary artery of inferior wall: Principal | ICD-10-CM

## 2014-10-28 DIAGNOSIS — E43 Unspecified severe protein-calorie malnutrition: Secondary | ICD-10-CM

## 2014-10-28 LAB — BASIC METABOLIC PANEL
ANION GAP: 13 (ref 5–15)
Anion gap: 10 (ref 5–15)
Anion gap: 12 (ref 5–15)
Anion gap: 12 (ref 5–15)
Anion gap: 10 (ref 5–15)
BUN: 9 mg/dL (ref 6–23)
BUN: 11 mg/dL (ref 6–23)
BUN: 11 mg/dL (ref 6–23)
BUN: 11 mg/dL (ref 6–23)
BUN: 11 mg/dL (ref 6–23)
CALCIUM: 7.7 mg/dL — AB (ref 8.4–10.5)
CALCIUM: 9 mg/dL (ref 8.4–10.5)
CALCIUM: 9 mg/dL (ref 8.4–10.5)
CHLORIDE: 101 meq/L (ref 96–112)
CHLORIDE: 101 meq/L (ref 96–112)
CO2: 23 mEq/L (ref 19–32)
CO2: 24 meq/L (ref 19–32)
CO2: 25 meq/L (ref 19–32)
CO2: 24 meq/L (ref 19–32)
CO2: 26 meq/L (ref 19–32)
CREATININE: 0.6 mg/dL (ref 0.50–1.35)
CREATININE: 0.53 mg/dL (ref 0.50–1.35)
CREATININE: 0.53 mg/dL (ref 0.50–1.35)
CREATININE: 0.52 mg/dL (ref 0.50–1.35)
Calcium: 9.1 mg/dL (ref 8.4–10.5)
Calcium: 8.9 mg/dL (ref 8.4–10.5)
Chloride: 108 mEq/L (ref 96–112)
Chloride: 102 mEq/L (ref 96–112)
Chloride: 102 mEq/L (ref 96–112)
Creatinine, Ser: 0.54 mg/dL (ref 0.50–1.35)
GFR calc Af Amer: >90 mL/min (ref >90)
GFR calc Af Amer: >90 mL/min (ref >90)
GFR calc Af Amer: >90 mL/min (ref >90)
GFR calc Af Amer: >90 mL/min (ref >90)
GFR calc Af Amer: >90 mL/min (ref >90)
GFR calc non Af Amer: >90 mL/min (ref >90)
GFR calc non Af Amer: >90 mL/min (ref >90)
GFR calc non Af Amer: >90 mL/min (ref >90)
GFR calc non Af Amer: >90 mL/min (ref >90)
GFR calc non Af Amer: >90 mL/min (ref >90)
GLUCOSE: 127 mg/dL — AB (ref 70–99)
GLUCOSE: 116 mg/dL — AB (ref 70–99)
GLUCOSE: 114 mg/dL — AB (ref 70–99)
GLUCOSE: 114 mg/dL — AB (ref 70–99)
Glucose, Bld: 125 mg/dL — ABNORMAL HIGH (ref 70–99)
Potassium: 4.6 mEq/L (ref 3.7–5.3)
Potassium: 3.8 mEq/L (ref 3.7–5.3)
Potassium: 3.7 mEq/L (ref 3.7–5.3)
Potassium: 3.8 mEq/L (ref 3.7–5.3)
Potassium: 3.9 mEq/L (ref 3.7–5.3)
Sodium: 141 mEq/L (ref 137–147)
Sodium: 139 mEq/L (ref 137–147)
Sodium: 139 mEq/L (ref 137–147)
Sodium: 137 mEq/L (ref 137–147)
Sodium: 137 mEq/L (ref 137–147)

## 2014-10-28 LAB — CBC
HEMATOCRIT: 39.9 % (ref 39.0–52.0)
HEMOGLOBIN: 13.8 g/dL (ref 13.0–17.0)
MCH: 29.1 pg (ref 26.0–34.0)
MCHC: 34.6 g/dL (ref 30.0–36.0)
MCV: 84.2 fL (ref 78.0–100.0)
Platelets: 194 10*3/uL (ref 150–400)
RBC: 4.74 MIL/uL (ref 4.22–5.81)
RDW: 13.8 % (ref 11.5–15.5)
WBC: 6.3 10*3/uL (ref 4.0–10.5)

## 2014-10-28 LAB — GLUCOSE, CAPILLARY
GLUCOSE-CAPILLARY: 108 mg/dL — AB (ref 70–99)
GLUCOSE-CAPILLARY: 121 mg/dL — AB (ref 70–99)
GLUCOSE-CAPILLARY: 130 mg/dL — AB (ref 70–99)
GLUCOSE-CAPILLARY: 146 mg/dL — AB (ref 70–99)
GLUCOSE-CAPILLARY: 151 mg/dL — AB (ref 70–99)
GLUCOSE-CAPILLARY: 306 mg/dL — AB (ref 70–99)
Glucose-Capillary: 112 mg/dL — ABNORMAL HIGH (ref 70–99)
Glucose-Capillary: 127 mg/dL — ABNORMAL HIGH (ref 70–99)
Glucose-Capillary: 145 mg/dL — ABNORMAL HIGH (ref 70–99)
Glucose-Capillary: 149 mg/dL — ABNORMAL HIGH (ref 70–99)
Glucose-Capillary: 169 mg/dL — ABNORMAL HIGH (ref 70–99)
Glucose-Capillary: 128 mg/dL — ABNORMAL HIGH (ref 70–99)
Glucose-Capillary: 115 mg/dL — ABNORMAL HIGH (ref 70–99)
Glucose-Capillary: 116 mg/dL — ABNORMAL HIGH (ref 70–99)
Glucose-Capillary: 135 mg/dL — ABNORMAL HIGH (ref 70–99)
Glucose-Capillary: 111 mg/dL — ABNORMAL HIGH (ref 70–99)
Glucose-Capillary: 123 mg/dL — ABNORMAL HIGH (ref 70–99)

## 2014-10-28 LAB — BLOOD GAS, ARTERIAL
ACID-BASE EXCESS: 0.3 mmol/L (ref 0.0–2.0)
BICARBONATE: 24.2 meq/L — AB (ref 20.0–24.0)
Drawn by: 39898
FIO2: 100 %
MECHVT: 630 mL
O2 SAT: 99.4 %
PEEP: 5 cmH2O
PO2 ART: 351 mmHg — AB (ref 80.0–100.0)
Patient temperature: 91.4
RATE: 14 resp/min
TCO2: 25.3 mmol/L (ref 0–100)
pCO2 arterial: 30.6 mmHg — ABNORMAL LOW (ref 35.0–45.0)
pH, Arterial: 7.488 — ABNORMAL HIGH (ref 7.350–7.450)

## 2014-10-28 LAB — BASIC METABOLIC PANEL WITH GFR
Anion gap: 13 (ref 5–15)
BUN: 10 mg/dL (ref 6–23)
CO2: 24 meq/L (ref 19–32)
Calcium: 9.2 mg/dL (ref 8.4–10.5)
Chloride: 104 meq/L (ref 96–112)
Creatinine, Ser: 0.54 mg/dL (ref 0.50–1.35)
GFR calc Af Amer: >90 mL/min
GFR calc non Af Amer: >90 mL/min
Glucose, Bld: 139 mg/dL — ABNORMAL HIGH (ref 70–99)
Potassium: 4.3 meq/L (ref 3.7–5.3)
Sodium: 141 meq/L (ref 137–147)

## 2014-10-28 LAB — URINE CULTURE
COLONY COUNT: NO GROWTH
CULTURE: NO GROWTH
Special Requests: NORMAL

## 2014-10-28 LAB — TROPONIN I: TROPONIN I: 1.53 ng/mL — AB (ref <0.30)

## 2014-10-28 LAB — CORTISOL: Cortisol, Plasma: 43.3 ug/dL

## 2014-10-28 MED ORDER — WHITE PETROLATUM GEL
Status: AC
  Administered 2014-10-28: 0.2
  Filled 2014-10-28: qty 5

## 2014-10-28 MED ORDER — METOPROLOL TARTRATE 1 MG/ML IV SOLN
5.0000 mg | Freq: Three times a day (TID) | INTRAVENOUS | Status: DC
  Administered 2014-10-29: 5 mg via INTRAVENOUS
  Filled 2014-10-28 (×4): qty 5

## 2014-10-28 MED ORDER — TICAGRELOR 90 MG PO TABS
90.0000 mg | ORAL_TABLET | Freq: Two times a day (BID) | ORAL | Status: DC
  Filled 2014-10-28 (×2): qty 1

## 2014-10-28 MED ORDER — TICAGRELOR 90 MG PO TABS
90.0000 mg | ORAL_TABLET | Freq: Two times a day (BID) | ORAL | Status: DC
  Administered 2014-10-28 – 2014-10-30 (×6): 90 mg
  Filled 2014-10-28 (×8): qty 1

## 2014-10-28 MED ORDER — DEXTROSE-NACL 5-0.45 % IV SOLN
INTRAVENOUS | Status: DC
  Administered 2014-10-28: 19:00:00 via INTRAVENOUS
  Administered 2014-10-30: 75 mL/h via INTRAVENOUS
  Administered 2014-10-31 (×2): via INTRAVENOUS

## 2014-10-28 MED ORDER — ATORVASTATIN CALCIUM 80 MG PO TABS
80.0000 mg | ORAL_TABLET | Freq: Every day | ORAL | Status: DC
  Administered 2014-10-28: 80 mg via NASOGASTRIC
  Filled 2014-10-28 (×2): qty 1

## 2014-10-28 MED ORDER — SODIUM CHLORIDE 0.9 % IV SOLN
INTRAVENOUS | Status: DC | PRN
Start: 2014-10-28 — End: 2014-10-30

## 2014-10-28 MED ORDER — ASPIRIN 81 MG PO CHEW
81.0000 mg | CHEWABLE_TABLET | Freq: Every day | ORAL | Status: DC
  Administered 2014-10-28 – 2014-10-29 (×2): 81 mg via NASOGASTRIC
  Filled 2014-10-28 (×2): qty 1

## 2014-10-28 MED FILL — Sodium Chloride IV Soln 0.9%: INTRAVENOUS | Qty: 50 | Status: AC

## 2014-10-28 NOTE — Plan of Care (Signed)
Problem: Phase I Progression Outcomes Goal: Cardiology consult Outcome: Completed/Met Date Met:  10/28/14 Goal: Meds per hypothermia orders Outcome: Completed/Met Date Met:  10/28/14 Goal: Cool target temp reached 2 hrs from start Outcome: Completed/Met Date Met:  10/28/14 Goal: No shivering or vent asynchrony Outcome: Completed/Met Date Met:  10/28/14 Goal: Maintain MAP > 85 mmHg Outcome: Completed/Met Date Met:  10/28/14 Goal: Aspirin unless contraindicated Outcome: Completed/Met Date Met:  10/28/14 Goal: If STEMI, emergency Cardiac Cath Outcome: Completed/Met Date Met:  10/28/14 Goal: Electrolytes normal or corrected Outcome: Completed/Met Date Met:  10/28/14 Goal: Other Phase I Outcomes/Goals Outcome: Completed/Met Date Met:  10/28/14  Problem: Phase II Progression Outcomes Goal: Active warming per orders Outcome: Progressing

## 2014-10-28 NOTE — Progress Notes (Addendum)
SUBJECTIVE:  Pt seen and examined in AM. No acute events on telemetry overnight. Pt remains intubated and sedated on hypothermic protocol with stable vital signs. He is status post emergent cardiac catherization one day ago revealing occluded distal RCA,  diffuse heavy calcification in the entire RCA with multiple segments of severe stenosis, and severe left ventricular systolic dysfunction.   OBJECTIVE:   Vitals:   Filed Vitals:   10/28/14 0900 10/28/14 1000 10/28/14 1100 10/28/14 1124  BP: 98/66  119/78 110/73  Pulse: 72 71 72 73  Temp: 91.4 F (33 C) 91.4 F (33 C) 91.4 F (33 C)   TempSrc: Core (Comment) Core (Comment) Core (Comment) Core (Comment)  Resp: 10 10 10 10   Height:      Weight:      SpO2: 98% 99% 100% 99%   I&O's:   Intake/Output Summary (Last 24 hours) at 10/28/14 1142 Last data filed at 10/28/14 1100  Gross per 24 hour  Intake 3257.1 ml  Output   7375 ml  Net -4117.9 ml   TELEMETRY: Reviewed telemetry pt in NSR and at times sinus bradycardia:     PHYSICAL EXAM General: Intubated and under sedation Head:   Normal cephalic and atramatic  Lungs:  Ronchi in anterior fields. Heart:  HRRR S1 S2  No JVD.   Abdomen: Abdomen soft and non-tender Msk:  Back normal,  Normal strength and tone for age. Extremities:  Trace b/l LE edema.  Right groin with CDI dressing. Neuro:  Sedated Skin: No rash   LABS: Basic Metabolic Panel:  Recent Labs  16/09/9610/06/15 0600 10/28/14 0954  NA 141 139  K 4.3 3.8  CL 104 102  CO2 24 24  GLUCOSE 139* 127*  BUN 10 11  CREATININE 0.54 0.53  CALCIUM 9.2 9.1   Liver Function Tests:  Recent Labs  10/27/14 1320  AST 147*  ALT 108*  ALKPHOS 103  BILITOT 1.1  PROT 7.1  ALBUMIN 3.7   No results for input(s): LIPASE, AMYLASE in the last 72 hours. CBC:  Recent Labs  10/27/14 1320  10/27/14 1809 10/27/14 2200 10/28/14 0600  WBC 4.8  --   --   --  6.3  HGB 13.4  < > 13.6  --  13.8  HCT 40.4  < > 40.0  --  39.9    MCV 86.9  --   --   --  84.2  PLT 197  --   --  219 194  < > = values in this interval not displayed. Cardiac Enzymes:  Recent Labs  10/27/14 1611 10/27/14 2145 10/28/14 0406  TROPONINI 2.30* 2.15* 1.53*   BNP: Invalid input(s): POCBNP D-Dimer: No results for input(s): DDIMER in the last 72 hours. Hemoglobin A1C: No results for input(s): HGBA1C in the last 72 hours. Fasting Lipid Panel: No results for input(s): CHOL, HDL, LDLCALC, TRIG, CHOLHDL, LDLDIRECT in the last 72 hours. Thyroid Function Tests: No results for input(s): TSH, T4TOTAL, T3FREE, THYROIDAB in the last 72 hours.  Invalid input(s): FREET3 Anemia Panel: No results for input(s): VITAMINB12, FOLATE, FERRITIN, TIBC, IRON, RETICCTPCT in the last 72 hours. Coag Panel:   Lab Results  Component Value Date   INR 1.11 10/27/2014   INR 1.49 10/27/2014   INR 1.12 10/27/2014    RADIOLOGY: Ct Head Wo Contrast  10/27/2014   CLINICAL DATA:  Cardiac arrest.  EXAM: CT HEAD WITHOUT CONTRAST  TECHNIQUE: Contiguous axial images were obtained from the base of the skull through the  vertex without intravenous contrast.  COMPARISON:  None.  FINDINGS: Bony calvarium appears intact. No mass effect or midline shift is noted. Ventricular size is within normal limits. There is no evidence of mass lesion, hemorrhage or acute infarction.  IMPRESSION: Normal head CT.   Electronically Signed   By: Roque LiasJames  Green M.D.   On: 10/27/2014 16:04   Dg Chest Portable 1 View  10/27/2014   CLINICAL DATA:  Weakness cardiac arrest.  Unresponsive.  EXAM: PORTABLE CHEST - 1 VIEW  COMPARISON:  None.  FINDINGS: Endotracheal tube with tip near the clavicular heads. An orogastric tube reaches the stomach.  There is no edema, consolidation, effusion, or pneumothorax. Thin line left lateral to the descending thoracic aorta favors minimal atelectasis. No convincing pneumomediastinum.  No visible rib fracture.  IMPRESSION: 1. Endotracheal and orogastric tubes are in  good position. 2. No cardiomegaly or pulmonary edema.   Electronically Signed   By: Tiburcio PeaJonathan  Watts M.D.   On: 10/27/2014 13:48      ASSESSMENT: 68-yo homeless man found down for 5-10 minutes found to have vfib arrest in setting of inferior STEMI that was resuscitated and underwent emergent cardiac catherization found to have and severe LV systolic dysfunction 25-30%.  PLAN:    Acute Inferior STEMI with Vfib Arrest in setting of occluded distal RCA and cocaine use - Currently intubated and sedated under hypothermic protocol. Pt is s/p PTCA with balloon angioplasty of the mid and distal RCA. Currently off aggrastat infusion and on brillinta 90 mg BID, aspirin 81 mg daily, lopressor 5 mg Q 8hr, lipitor 80 mg daily. Once able to assess neurological status and if full recovery, may consider PCI of RCA due to diffuse heavy calcification in the entire RCA with multiple segments of severe stenosis, however risk of stent thrombosis in setting of probable noncompliant patient is high. Obtain lipid panel and A1c. Ventilator Dependent Respiratory Failure - Currently intubated. PCCM managing. Severe Left Ventricular Systolic Dysfunction - EF 25-30% on catherization. 2D-echo pending. Currently on IV lopressor 5 mg Q 8hr, lipitor 80 mg daily. Transaminititis - AST/ALT elevated most likely in setting of ischemia. Obtain acute hepatitis panel and HIV Ab. Continue to monitor. Hypokalemia - Resolved s/p IV potassium chloride. Continue to monitor. Substance Abuse - UDS positive for cocaine and benzodiazepine.   Otis BraceABBANI, MARJAN, MD  10/28/2014  11:42 AM   I have examined the patient and reviewed assessment and plan and discussed with patient.  Agree with above as stated.  Supportive care.  Discussed findings with the patient's brother.  Await to see about neurologic recovery.  S/p POBA of distal RCA.  Rhythm has been stable.  EF decreased.  WIll need medical therapy for this once he wakes up.   Complex patient with  guarded prognosis.  Favorable for him is that down time apparently was short.  Will know more after rewarming.  Reine Bristow S.

## 2014-10-28 NOTE — Progress Notes (Signed)
Moderate amount milky white discharge noted from urethra. Peri/foley care provided multiple times throughout shift with immediate return of discharge. Will continue to monitor.

## 2014-10-28 NOTE — Progress Notes (Signed)
UR Completed.  Cynethia Schindler Jane [phone] 10/28/2014  

## 2014-10-28 NOTE — Progress Notes (Signed)
INITIAL NUTRITION ASSESSMENT  Pt meets criteria for SEVERE MALNUTRITION in the context of chronic as evidenced by severe fat and muscle depletion.  DOCUMENTATION CODES Per approved criteria  -Severe malnutrition in the context of chronic illness   INTERVENTION: If pt remains intubated and off hypothermia protocol recommend:  Initiate Vital AF 1.2 @ 20 ml/hr via OG tube and increase by 10 ml every 12 hours to goal rate of 60 ml/hr.   Monitor magnesium, potassium, and phosphorus daily for at least 3 days, MD to replete as needed, as pt is at risk for refeeding syndrome given severe malnutrition.  MVI daily  Tube feeding regimen provides 1728 kcal (103% of needs), 108 grams of protein, and 1167 ml of H2O.   NUTRITION DIAGNOSIS: Inadequate oral intake related to inability to eat as evidenced by NPO status  Goal: Pt to meet >/= 90% of their estimated nutrition needs   Monitor:  Respiratory status, TF initiation and tolerance, weight trends,   Reason for Assessment: Ventilator   68 y.o. male  Admitting Dx: Vfib arrest   ASSESSMENT: Pt had witnessed v fib arrest after entering homeless shelter. [facility]. Pt positive for cocaine and benzos on admission.   Patient is currently intubated on ventilator support MV: 7 L/min Temp (24hrs), Avg:91.6 F (33.1 C), Min:90.5 F (32.5 C), Max:96.9 F (36.1 C)  Propofol: none  Nutrition Focused Physical Exam:  Subcutaneous Fat:  Orbital Region: WDL Upper Arm Region: severe depletion  Thoracic and Lumbar Region: NA  Muscle:  Temple Region: severe depletion  Clavicle Bone Region: severe depletion  Clavicle and Acromion Bone Region: severe depletion  Scapular Bone Region: severe depletion Dorsal Hand: severe depletion  Patellar Region: NA Anterior Thigh Region: NA Posterior Calf Region: moderate depletion   Edema: not present   Height: Ht Readings from Last 1 Encounters:  10/27/14 6\' 1"  (1.854  m)    Weight: Wt Readings from Last 1 Encounters:  10/28/14 158 lb 1.1 oz (71.7 kg)    Ideal Body Weight: 83.6 kg   % Ideal Body Weight: 86%  Wt Readings from Last 10 Encounters:  10/28/14 158 lb 1.1 oz (71.7 kg)    Usual Body Weight: unknown  % Usual Body Weight: -  BMI:  Body mass index is 20.86 kg/(m^2).  Estimated Nutritional Needs: Kcal: 1677 Protein: 95-110 grams Fluid: > 1.6 l/day  Skin: diabetic toe ulcer  Diet Order: Diet NPO time specified  EDUCATION NEEDS: -No education needs identified at this time   Intake/Output Summary (Last 24 hours) at 10/28/14 1248 Last data filed at 10/28/14 1100  Gross per 24 hour  Intake 3257.1 ml  Output   7375 ml  Net -4117.9 ml    Last BM: PTA   Labs:   Recent Labs Lab 10/28/14 0211 10/28/14 0600 10/28/14 0954  NA 141 141 139  K 4.6 4.3 3.8  CL 108 104 102  CO2 23 24 24   BUN 9 10 11   CREATININE 0.60 0.54 0.53  CALCIUM 7.7* 9.2 9.1  GLUCOSE 125* 139* 127*    CBG (last 3)   Recent Labs  10/28/14 0556 10/28/14 0708 10/28/14 0821  GLUCAP 146* 115* 116*    Scheduled Meds: . antiseptic oral rinse  7 mL Mouth Rinse QID  . artificial tears  1 application Both Eyes 3 times per day  . aspirin  81 mg Per NG tube Daily  . atorvastatin  80 mg Per NG tube q1800  .  chlorhexidine  15 mL Mouth Rinse BID  . fentaNYL  100 mcg Intravenous Once  . metoprolol  5 mg Intravenous 3 times per day  . midazolam  2 mg Intravenous Once  . pantoprazole (PROTONIX) IV  40 mg Intravenous QHS  . ticagrelor  90 mg Per Tube BID  . white petrolatum        Continuous Infusions: . sodium chloride 20 mL/hr at 10/27/14 1900  . cisatracurium (NIMBEX) infusion 1.5 mcg/kg/min (10/28/14 0800)  . dextrose 5 % and 0.45% NaCl 50 mL/hr at 10/28/14 1048  . fentaNYL infusion INTRAVENOUS 200 mcg/hr (10/28/14 0800)  . midazolam (VERSED) infusion 6 mg/hr (10/28/14 0800)  . norepinephrine (LEVOPHED) Adult infusion      History reviewed.  No pertinent past medical history.  History reviewed. No pertinent past surgical history.  Kendell BaneHeather Ricci Dirocco RD, LDN, CNSC [phone] Pager [phone] After Hours Pager

## 2014-10-28 NOTE — Progress Notes (Signed)
eLink Physician-Brief Progress Note Patient Name: [name] DOB: 1946/06/28 MRN: [id number]   Date of Service  10/28/2014  HPI/Events of Note  resp alkalosis  eICU Interventions  Decrease RR 10, FiO2 40%     Intervention Category Major Interventions: Acid-Base disturbance - evaluation and management  MCQUAID, DOUGLAS 10/28/2014, 5:20 AM

## 2014-10-28 NOTE — Progress Notes (Signed)
Echocardiogram 2D Echocardiogram has been performed.  [name] 10/28/2014, 11:22 AM

## 2014-10-28 NOTE — Procedures (Signed)
Arterial Catheter Insertion Procedure Note [name] [id number] Nov 30, 1946  Procedure: Insertion of Arterial Catheter  Indications: Blood pressure monitoring and Frequent blood sampling  Procedure Details Consent: Risks of procedure as well as the alternatives and risks of each were explained to the (patient/caregiver).  Consent for procedure obtained. and Unable to obtain consent because of emergent medical necessity. Time Out: Verified patient identification, verified procedure, site/side was marked, verified correct patient position, special equipment/implants available, medications/allergies/relevent history reviewed, required imaging and test results available.  Performed  Maximum sterile technique was used including antiseptics, gloves, gown, hand hygiene, mask and sheet. Skin prep: Chlorhexidine; local anesthetic administered 20 gauge catheter was inserted into left radial artery using the Seldinger technique.  Evaluation Blood flow poor; BP tracing good. Complications: No apparent complications.   Leafy HalfSnider, [name] 10/28/2014

## 2014-10-28 NOTE — Progress Notes (Addendum)
PULMONARY / CRITICAL CARE MEDICINE   Name: Smith MinceJames Bobby MRN: [id number] DOB: 1946/09/26    ADMISSION DATE:  10/27/2014   REFERRING MD :  EDP  INITIAL PRESENTATION:  Ad[facility] as [name] from homeless shelter after witnessed V fib arrest due to STEMI, intubated, post ACLS inc DCCV, emergent cath   SIGNIFICANT EVENTS/STUDIES: 11/05 Vfib arrest with 5- 15 minutes down time. DCCV X 4 reported 11/05 LHC: Inferior STEMI secondary to occluded distal RCA. Diffuse heavy calcification in the entire RCA with multiple segments of severe stenosis. Severe LV systolic dysfunction 11/05 CT head: NAD 11/06 TTE:  11/06 EEG:   INDWELLING DEVICES:: ETT 11/05 >>  L radial A line 11/05 >> R femoral venous sheath 11/05 >>   MICRO DATA: Urine 11/05 >>  Resp 11/05  >>  Blood 11/05 >>   ANTIMICROBIALS: none   SUBJECTIVE: Intubated/Sedated. Remains on hypothermia protocol.  VITAL SIGNS: Temp:  [90.5 F (32.5 C)-96.9 F (36.1 C)] 91.4 F (33 C) (11/06 1100) Pulse Rate:  [38-84] 73 (11/06 1124) Resp:  [7-16] 10 (11/06 1124) BP: (98-220)/(66-136) 110/73 mmHg (11/06 1124) SpO2:  [98 %-100 %] 99 % (11/06 1124) Arterial Line BP: (101-135)/(68-94) 118/77 mmHg (11/06 1100) FiO2 (%):  [40 %-100 %] 40 % (11/06 1124) Weight:  [154 lb 5.2 oz (70 kg)-158 lb 1.1 oz (71.7 kg)] 158 lb 1.1 oz (71.7 kg) (11/06 0500) HEMODYNAMICS:   VENTILATOR SETTINGS: Vent Mode:  [-] PRVC FiO2 (%):  [40 %-100 %] 40 % Set Rate:  [10 bmp-16 bmp] 14 bmp Vt Set:  [500 mL-630 mL] 500 mL PEEP:  [4 cmH20-5 cmH20] 5 cmH20 Plateau Pressure:  [13 cmH20-21 cmH20] 13 cmH20 INTAKE / OUTPUT:  Intake/Output Summary (Last 24 hours) at 10/28/14 1212 Last data filed at 10/28/14 1100  Gross per 24 hour  Intake 3257.1 ml  Output   7375 ml  Net -4117.9 ml    PHYSICAL EXAMINATION: General:  Thin, intubated, sedated, paralyzed Neuro: Cannot perform due to NMB HEENT:  NCAT, PERRL Cardiovascular: REG, No M Lungs: clear Abdomen  Soft, +BS Ext: warm, no edema  LABS: I have reviewed all of today's lab results. Relevant abnormalities are discussed in the A/P section  CXR: NNF  ASSESSMENT / PLAN:  PULMONARY A: VDRF post VF arrest P:   Cont full vent support - settings reviewed and adjusted Cont vent bundle Daily SBT if/when meets criteria  CARDIOVASCULAR A:  VF arrest Inf wall STEMI RCA stenosis, could not be stented Hypertension, controlled P:  Cards managing MAP goal 85 mmHg per hypothermia protocol Cont Brilinta Cont Lopressor  F/u TTE 11/06  RENAL A:   Hypernatremia, resolved Hypokalemia, resolved P:   Monitor BMET intermittently Monitor I/Os Correct electrolytes as indicated  GASTROINTESTINAL A:   Protein-calorie malnutrition P:   SUP: IV PPI Initiate TFs after rewarming completed (11/07)  HEMATOLOGIC A:   No acute issues P:  DVT px: fully anticoagulated Monitor CBC intermittently Transfuse per usual ICU guidelines  INFECTIOUS A:   No overt infection P:   Micro and abx as above  ENDOCRINE A:  Mild hyperglycemia P:   CBGs per hyperglycemia protocol  NEUROLOGIC A:   Post anoxic acute encephalopathy ICU/vent associated discomfort P:   Complete hypothermia protocol RASS goal: N/A while paralyzed, -1 after rewarming Cont fent and versed gtt per protocol F/u EEG 11/06   FAMILY  - Updates: Son and daughter updated in detail  - Inter-disciplinary family meet or Palliative Care meeting due by:  11/03/2014  TODAY'S SUMMARY:   35 mins CCM time   Billy Fischeravid Simonds, MD ; [facility] Mobile [phone].  After 5:30 PM or weekends, call [phone]

## 2014-10-28 NOTE — Progress Notes (Signed)
Patients belongings  Including cell phone and papers given to [name] the son. Daughter Deanna Artis([name]) in room at time.

## 2014-10-29 ENCOUNTER — Encounter (HOSPITAL_COMMUNITY): Payer: Self-pay | Admitting: *Deleted

## 2014-10-29 ENCOUNTER — Inpatient Hospital Stay (HOSPITAL_COMMUNITY): Payer: Medicare Other

## 2014-10-29 LAB — GLUCOSE, CAPILLARY
GLUCOSE-CAPILLARY: 108 mg/dL — AB (ref 70–99)
GLUCOSE-CAPILLARY: 113 mg/dL — AB (ref 70–99)
GLUCOSE-CAPILLARY: 112 mg/dL — AB (ref 70–99)
Glucose-Capillary: 100 mg/dL — ABNORMAL HIGH (ref 70–99)
Glucose-Capillary: 111 mg/dL — ABNORMAL HIGH (ref 70–99)

## 2014-10-29 LAB — LIPID PANEL
Cholesterol: 180 mg/dL (ref 0–200)
HDL: 55 mg/dL (ref >39)
LDL CALC: 116 mg/dL — AB (ref 0–99)
Total CHOL/HDL Ratio: 3.3 RATIO
Triglycerides: 46 mg/dL (ref <150)
VLDL: 9 mg/dL (ref 0–40)

## 2014-10-29 LAB — CBC
HCT: 39 % (ref 39.0–52.0)
Hemoglobin: 13.1 g/dL (ref 13.0–17.0)
MCH: 28.6 pg (ref 26.0–34.0)
MCHC: 33.6 g/dL (ref 30.0–36.0)
MCV: 85.2 fL (ref 78.0–100.0)
Platelets: 207 10*3/uL (ref 150–400)
RBC: 4.58 MIL/uL (ref 4.22–5.81)
RDW: 14.1 % (ref 11.5–15.5)
WBC: 9.1 10*3/uL (ref 4.0–10.5)

## 2014-10-29 LAB — BASIC METABOLIC PANEL
Anion gap: 11 (ref 5–15)
BUN: 10 mg/dL (ref 6–23)
CO2: 26 mEq/L (ref 19–32)
Calcium: 8.9 mg/dL (ref 8.4–10.5)
Chloride: 101 mEq/L (ref 96–112)
Creatinine, Ser: 0.59 mg/dL (ref 0.50–1.35)
GFR calc Af Amer: >90 mL/min (ref >90)
GFR calc non Af Amer: >90 mL/min (ref >90)
Glucose, Bld: 110 mg/dL — ABNORMAL HIGH (ref 70–99)
Potassium: 3.9 mEq/L (ref 3.7–5.3)
Sodium: 138 mEq/L (ref 137–147)

## 2014-10-29 LAB — COMPREHENSIVE METABOLIC PANEL
ALT: 85 U/L — AB (ref 0–53)
AST: 163 U/L — AB (ref 0–37)
Albumin: 3.2 g/dL — ABNORMAL LOW (ref 3.5–5.2)
Alkaline Phosphatase: 92 U/L (ref 39–117)
Anion gap: 13 (ref 5–15)
BUN: 10 mg/dL (ref 6–23)
CHLORIDE: 100 meq/L (ref 96–112)
CO2: 24 meq/L (ref 19–32)
Calcium: 8.9 mg/dL (ref 8.4–10.5)
Creatinine, Ser: 0.79 mg/dL (ref 0.50–1.35)
GFR calc Af Amer: >90 mL/min (ref >90)
Glucose, Bld: 99 mg/dL (ref 70–99)
POTASSIUM: 4.5 meq/L (ref 3.7–5.3)
SODIUM: 137 meq/L (ref 137–147)
Total Bilirubin: 0.5 mg/dL (ref 0.3–1.2)
Total Protein: 6.8 g/dL (ref 6.0–8.3)

## 2014-10-29 LAB — GASTRIC OCCULT BLOOD (1-CARD TO LAB): Occult Blood, Gastric: NEGATIVE

## 2014-10-29 LAB — POCT GASTRIC OCCULT BLOOD (1-CARD TO LAB)

## 2014-10-29 LAB — HIV ANTIBODY (ROUTINE TESTING W REFLEX): HIV 1&2 Ab, 4th Generation: NONREACTIVE

## 2014-10-29 MED ORDER — SODIUM CHLORIDE 0.9 % IV SOLN
0.0000 ug/h | INTRAVENOUS | Status: DC
  Administered 2014-10-29: 300 ug/h via INTRAVENOUS
  Filled 2014-10-29 (×2): qty 50

## 2014-10-29 MED ORDER — PNEUMOCOCCAL VAC POLYVALENT 25 MCG/0.5ML IJ INJ
0.5000 mL | INJECTION | INTRAMUSCULAR | Status: AC
  Administered 2014-10-30: 0.5 mL via INTRAMUSCULAR
  Filled 2014-10-29: qty 0.5

## 2014-10-29 MED ORDER — METOPROLOL TARTRATE 1 MG/ML IV SOLN
5.0000 mg | Freq: Once | INTRAVENOUS | Status: AC
Start: 2014-10-29 — End: 2014-10-29
  Administered 2014-10-29: 5 mg via INTRAVENOUS

## 2014-10-29 MED ORDER — DEXTROSE 5 % IV SOLN
1.0000 g | INTRAVENOUS | Status: DC
  Administered 2014-10-29 – 2014-10-30 (×2): 1 g via INTRAVENOUS
  Filled 2014-10-29 (×2): qty 10

## 2014-10-29 MED ORDER — MIDAZOLAM HCL 2 MG/2ML IJ SOLN: 2.0000 mg | INTRAMUSCULAR | Status: DC | PRN

## 2014-10-29 MED ORDER — LISINOPRIL 5 MG PO TABS
5.0000 mg | ORAL_TABLET | Freq: Every day | ORAL | Status: DC
  Administered 2014-10-29 – 2014-10-30 (×2): 5 mg via ORAL
  Filled 2014-10-29 (×3): qty 1

## 2014-10-29 MED ORDER — FENTANYL BOLUS VIA INFUSION
25.0000 ug | INTRAVENOUS | Status: DC | PRN
  Filled 2014-10-29: qty 50

## 2014-10-29 MED ORDER — ATORVASTATIN CALCIUM 80 MG PO TABS
80.0000 mg | ORAL_TABLET | Freq: Every day | ORAL | Status: DC
  Administered 2014-10-29 – 2014-11-04 (×6): 80 mg via ORAL
  Filled 2014-10-29 (×9): qty 1

## 2014-10-29 MED ORDER — NALOXONE HCL 0.4 MG/ML IJ SOLN
INTRAMUSCULAR | Status: AC
  Administered 2014-10-29: 0.2 mg
  Filled 2014-10-29: qty 1

## 2014-10-29 MED ORDER — ENOXAPARIN SODIUM 40 MG/0.4ML ~~LOC~~ SOLN
40.0000 mg | SUBCUTANEOUS | Status: DC
  Administered 2014-10-29 – 2014-11-04 (×5): 40 mg via SUBCUTANEOUS
  Filled 2014-10-29 (×8): qty 0.4

## 2014-10-29 MED ORDER — FENTANYL CITRATE 0.05 MG/ML IJ SOLN
25.0000 ug | INTRAMUSCULAR | Status: DC | PRN
  Administered 2014-10-29 – 2014-10-30 (×3): 50 ug via INTRAVENOUS
  Administered 2014-10-30 – 2014-10-31 (×10): 100 ug via INTRAVENOUS
  Administered 2014-11-01: 50 ug via INTRAVENOUS
  Administered 2014-11-01 – 2014-11-03 (×4): 100 ug via INTRAVENOUS
  Filled 2014-10-29 (×20): qty 2

## 2014-10-29 MED ORDER — DEXMEDETOMIDINE BOLUS VIA INFUSION
1.0000 ug/kg | Freq: Once | INTRAVENOUS | Status: AC
  Administered 2014-10-29: 72.1 ug via INTRAVENOUS
  Filled 2014-10-29: qty 73

## 2014-10-29 MED ORDER — METOPROLOL TARTRATE 1 MG/ML IV SOLN
INTRAVENOUS | Status: AC
  Filled 2014-10-29: qty 5

## 2014-10-29 MED ORDER — IPRATROPIUM-ALBUTEROL 0.5-2.5 (3) MG/3ML IN SOLN
3.0000 mL | Freq: Four times a day (QID) | RESPIRATORY_TRACT | Status: DC
  Administered 2014-10-29 – 2014-10-31 (×9): 3 mL via RESPIRATORY_TRACT
  Filled 2014-10-29 (×11): qty 3

## 2014-10-29 MED ORDER — DEXMEDETOMIDINE HCL IN NACL 200 MCG/50ML IV SOLN
0.4000 ug/kg/h | INTRAVENOUS | Status: DC
  Administered 2014-10-29: 0.4 ug/kg/h via INTRAVENOUS
  Filled 2014-10-29: qty 50

## 2014-10-29 MED ORDER — SODIUM CHLORIDE 0.9 % IV SOLN: INTRAVENOUS | Status: DC | PRN

## 2014-10-29 MED ORDER — INFLUENZA VAC SPLIT QUAD 0.5 ML IM SUSY
0.5000 mL | PREFILLED_SYRINGE | INTRAMUSCULAR | Status: AC
  Administered 2014-10-30: 0.5 mL via INTRAMUSCULAR
  Filled 2014-10-29: qty 0.5

## 2014-10-29 MED ORDER — METOPROLOL TARTRATE 25 MG PO TABS
25.0000 mg | ORAL_TABLET | Freq: Two times a day (BID) | ORAL | Status: DC
  Administered 2014-10-29: 25 mg via ORAL
  Filled 2014-10-29: qty 1

## 2014-10-29 MED ORDER — ONDANSETRON HCL 4 MG/2ML IJ SOLN
4.0000 mg | Freq: Once | INTRAMUSCULAR | Status: AC
  Administered 2014-10-29: 4 mg via INTRAVENOUS
  Filled 2014-10-29: qty 2

## 2014-10-29 MED ORDER — METOPROLOL TARTRATE 50 MG PO TABS
50.0000 mg | ORAL_TABLET | Freq: Two times a day (BID) | ORAL | Status: DC
  Administered 2014-10-29: 50 mg via ORAL
  Filled 2014-10-29 (×3): qty 1

## 2014-10-29 MED ORDER — ASPIRIN 300 MG RE SUPP
300.0000 mg | Freq: Every day | RECTAL | Status: DC
  Filled 2014-10-29: qty 1

## 2014-10-29 MED ORDER — ASPIRIN 81 MG PO CHEW
81.0000 mg | CHEWABLE_TABLET | Freq: Every day | ORAL | Status: DC
  Administered 2014-10-30: 81 mg via ORAL
  Filled 2014-10-29: qty 1

## 2014-10-29 NOTE — Progress Notes (Signed)
PULMONARY / CRITICAL CARE MEDICINE   Name: Smith MinceJames Banta MRN: [id number] DOB: 12-08-1946    ADMISSION DATE:  10/27/2014   INITIAL PRESENTATION:  Admitted as [name] from homeless shelter after witnessed V fib arrest due to STEMI, intubated, post ACLS inc DCCV, emergent cath   SIGNIFICANT EVENTS/STUDIES: 11/05 Vfib arrest with 5- 15 minutes down time. DCCV X 4 reported 11/05 LHC: Inferior STEMI secondary to occluded distal RCA. Diffuse heavy calcification in the entire RCA with multiple segments of severe stenosis. Severe LV systolic dysfunction 11/05 CT head: NAD 11/06 TTE: LVEF 20-25%. AK of basal-midinferior myocardium. Grade 1 DD 11/06 EEG: mod-severe generalized slowing without epileptiform discharges 11/07 Passed SBT. Very agitated and not F/C on WUA. Extubated and no distress post extubation. dexmedetomidine gtt ordered  INDWELLING DEVICES:: ETT 11/05 >>  L radial A line 11/05 >> R femoral venous sheath 11/05 >>   MICRO DATA: Urine 11/05 >> NEG Resp 11/07  >>  Blood 11/05 >>   ANTIMICROBIALS:  Ceftriaxone 11/07 >>   SUBJECTIVE:  Passed SBT. Agitated and not F/C on WUA. Extubated and no distress post extubation.   VITAL SIGNS: Temp:  [91.2 F (32.9 C)-100.9 F (38.3 C)] 97.3 F (36.3 C) (11/07 1200) Pulse Rate:  [76-151] 121 (11/07 1200) Resp:  [7-24] 20 (11/07 1200) BP: (84-204)/(47-97) 111/69 mmHg (11/07 1200) SpO2:  [93 %-100 %] 100 % (11/07 1200) Arterial Line BP: (77-175)/(49-81) 117/66 mmHg (11/07 1200) FiO2 (%):  [40 %] 40 % (11/07 0725) Weight:  [72.1 kg (158 lb 15.2 oz)] 72.1 kg (158 lb 15.2 oz) (11/07 0500) HEMODYNAMICS:   VENTILATOR SETTINGS: Vent Mode:  [-] PSV;CPAP FiO2 (%):  [40 %] 40 % Set Rate:  [14 bmp] 14 bmp Vt Set:  [500 mL] 500 mL PEEP:  [5 cmH20] 5 cmH20 Pressure Support:  [5 cmH20] 5 cmH20 Plateau Pressure:  [15 cmH20-18 cmH20] 18 cmH20 INTAKE / OUTPUT:  Intake/Output Summary (Last 24 hours) at 10/29/14 1243 Last data filed at  10/29/14 1200  Gross per 24 hour  Intake 2442.98 ml  Output   1700 ml  Net 742.98 ml    PHYSICAL EXAMINATION: General:  Thin to cachectic, CAM ICU positive, RASS -2 to +2 Neuro: MAEs vigorously, cognition poor, not F/C HEENT:  NCAT, PERRL Cardiovascular: REG, No M Lungs: ronchi, moderate purulent secretions Abdomen Soft, +BS Ext: warm, no edema  LABS: I have reviewed all of today's lab results. Relevant abnormalities are discussed in the A/P section  CXR: small RLL infiltrate, blunting of L CP angle  ASSESSMENT / PLAN:  PULMONARY A: VDRF post VF arrest Suspect early HCAP P:   Monitor in ICU post extubation Supp O2 to maintain SpO2 > 92% NTS PRN  CARDIOVASCULAR A:  VF arrest Inf wall STEMI RCA stenosis, could not be stented Hypertension, controlled Severe dilated CM P:  Cards managing MAP goal 60 mmhg  RENAL A:   Hypernatremia, resolved Hypokalemia, resolved P:   Monitor BMET intermittently Monitor I/Os Correct electrolytes as indicated  GASTROINTESTINAL A:   Protein-calorie malnutrition Dysphagia due to encephalopathy P:   SUP: IV PPI Place NGT for meds  HEMATOLOGIC A:   No acute issues P:  DVT px: enox SQ Monitor CBC intermittently Transfuse per usual ICU guidelines  INFECTIOUS A:   Suspect early HCAP P:   Micro and abx as above  ENDOCRINE A:  Mild hyperglycemia, resolved P:   Monitor glu on chem panels  NEUROLOGIC A:   Post anoxic acute encephalopathy ICU/vent associated  discomfort P:   RASS goal: 0 Begin dexmedetomidine Low dose PRN fent   FAMILY  - Updates: Son and daughter updated in detail  - Inter-disciplinary family meet or Palliative Care meeting due by:  11/03/2014    TODAY'S SUMMARY:   40 mins CCM time   Billy Fischeravid Deidrea Gaetz, MD ; [facility] [phone].  After 5:30 PM or weekends, call [phone]

## 2014-10-29 NOTE — Procedures (Signed)
Extubation Procedure Note  Patient Details:   Name: [name] DOB: 15-Sep-1946 MRN: [id number]   Airway Documentation:     Evaluation  O2 sats: stable throughout Complications: No apparent complications Patient did tolerate procedure well. Bilateral Breath Sounds: Diminished Suctioning: Airway No  Patient tolerated wean. MD ordered to extubate. Positive for cuff leak. Patient extubated to 4 Lpm nasal cannula. No signs of stridor or dyspnea. Patient resting comfortably. RN and MD at bedside.   Ancil BoozerSmallwood, Mikelle Myrick 10/29/2014, 9:36 AM

## 2014-10-29 NOTE — Progress Notes (Signed)
Dr Craige CottaSood notified of pt 's movements, rapid increase in  BP and HR when Nimbex stopped. Order to continue current sedation. Will continue to monitor.

## 2014-10-29 NOTE — Progress Notes (Signed)
EEG Completed; Results Pending  

## 2014-10-29 NOTE — Progress Notes (Signed)
Sputum sample obtained (thick, yellow secretions) and sent to lab by RT.

## 2014-10-29 NOTE — Progress Notes (Signed)
Patient Name: [name] Date of Encounter: 10/29/2014  Active Problems:   Cardiac arrest   Inferior MI   Protein-calorie malnutrition, severe   Respiratory failure   STEMI (ST elevation myocardial infarction)   Hypoxic ischemic encephalopathy (HIE)   Length of Stay: 2  SUBJECTIVE  Somnolent, extubated this am, agitated, received Precedex, hyper > hypotension. Responsive.   CURRENT MEDS . antiseptic oral rinse  7 mL Mouth Rinse QID  . aspirin  300 mg Rectal Daily  . atorvastatin  80 mg Oral q1800  . cefTRIAXone (ROCEPHIN)  IV  1 g Intravenous Q24H  . chlorhexidine  15 mL Mouth Rinse BID  . dexmedetomidine  1 mcg/kg Intravenous Once  . ipratropium-albuterol  3 mL Nebulization Q6H  . metoprolol  5 mg Intravenous 3 times per day  . pantoprazole (PROTONIX) IV  40 mg Intravenous QHS  . ticagrelor  90 mg Per Tube BID   OBJECTIVE  Filed Vitals:   10/29/14 0725 10/29/14 0746 10/29/14 0828 10/29/14 0920  BP: 160/78 204/97 180/87   Pulse: 117  137 92  Temp:   98.6 F (37 C)   TempSrc:   Core (Comment)   Resp: 15  16 12   Height:      Weight:      SpO2: 95%  94% 97%    Intake/Output Summary (Last 24 hours) at 10/29/14 1038 Last data filed at 10/29/14 0900  Gross per 24 hour  Intake 2012.68 ml  Output   1350 ml  Net 662.68 ml   Filed Weights   10/27/14 1645 10/28/14 0500 10/29/14 0500  Weight: 156 lb 1.4 oz (70.8 kg) 158 lb 1.1 oz (71.7 kg) 158 lb 15.2 oz (72.1 kg)    PHYSICAL EXAM  General: Pleasant, NAD. Neuro: Alert and oriented X 3. Moves all extremities spontaneously. Psych: Normal affect. HEENT:  Normal  Neck: Supple without bruits or JVD. Lungs:  Resp regular and unlabored, CTA. Heart: RRR no s3, s4, or murmurs. Abdomen: Soft, non-tender, non-distended, BS + x 4.  Extremities: No clubbing, cyanosis or edema. DP/PT/Radials 2+ and equal bilaterally.  Accessory Clinical Findings  CBC  Recent Labs  10/28/14 0600 10/29/14 0541  WBC 6.3 9.1    HGB 13.8 13.1  HCT 39.9 39.0  MCV 84.2 85.2  PLT 194 207   Basic Metabolic Panel  Recent Labs  10/29/14 0200 10/29/14 0541  NA 138 137  K 3.9 4.5  CL 101 100  CO2 26 24  GLUCOSE 110* 99  BUN 10 10  CREATININE 0.59 0.79  CALCIUM 8.9 8.9   Liver Function Tests  Recent Labs  10/27/14 1320 10/29/14 0541  AST 147* 163*  ALT 108* 85*  ALKPHOS 103 92  BILITOT 1.1 0.5  PROT 7.1 6.8  ALBUMIN 3.7 3.2*   No results for input(s): LIPASE, AMYLASE in the last 72 hours. Cardiac Enzymes  Recent Labs  10/27/14 1611 10/27/14 2145 10/28/14 0406  TROPONINI 2.30* 2.15* 1.53*    Recent Labs  10/29/14 0542  CHOL 180  HDL 55  LDLCALC 116*  TRIG 46  CHOLHDL 3.3   Radiology/Studies Dg Chest Port 1 View  10/29/2014   CLINICAL DATA:  68 year old male currently admitted with acute respiratory failure.  IMPRESSION: 1. Developing hazy opacity in the medial right lower lobe concerning for pneumonia or aspiration. 2. Trace left pleural effusion with associated atelectasis. 3. Stable borderline cardiomegaly. 4. Stable position of endotracheal tube. 5. The tip of the nasogastric tube overlies the upper  stomach just beyond the GE junction. Consider advancing several cm.   Electronically Signed   By: Malachy MoanHeath  McCullough M.D.   On: 10/29/2014 07:42    Dg Abd Portable 1v  10/29/2014   CLINICAL DATA:  Nasogastric tube placement  IMPRESSION: Tip of NG tube projects over the body of the stomach. Consider advancing 3-5 cm for placement of the side hole distal to the GE junction.   Electronically Signed   By: Christiana PellantGretchen  Green M.D.   On: 10/29/2014 09:50   Cardiac cath: 10/27/14 Impression: 1. Inferior STEMI secondary to occluded distal RCA 2. Out of hospital cardiac arrest (venticular fibrillation) 3. Diffuse heavy calcification in the entire RCA with multiple segments of severe stenosis.  4. Severe LV systolic dysfunction, LVEF=25-30% with inferior hypokinesis  Recommendations: He will be  managed in the CCU. To head CT now per PCCM. If no IC bleeding, plans for cooling protocol. In regards to his cardiac disease, will run Aggrastat drip for 18 hours. Will load Brilinta 180 mg x 1 per NG tube. Will start Brilinta 90 mg po BID after that with first dose tonight. Will start statin, beta blocker (tachycardia and HTN). Continue ASA. Echo tomorrow. Ventilator management per PCCM. Long term plans for management of the RCA are complex. I was able to restore antegrade flow down the vessel with balloon inflations but could not fully inflate balloons in the mid segment due to heavy calcification. If he has full neurological recovery, can consider PCI of the RCA with rotablator atherectomy, however, the downside to this would be the need for a very long segment of overlapping stents in a patient who at this time may not have the ability to remain compliant with medical therapy. This would be high risk for stent thrombosis.    TELE: in and out of a-fib with RVR     ASSESSMENT AND PLAN  [facility] as [facility]John [facility] from [facility] after witnessed V fib arrest due to STEMI, intubated, post ACLS inc DCCV, emergent cath, severe RCA stenosis cooled and rewarmed, now extubated, agitated  1. S/P STEMI, cath severe RCA disease, with some restoration of flow, would consider PCI of the RCA with rotablator atherectomy, however, the downside to this would be the need for a very long segment of overlapping stents in a patient who at this time may not have the ability to remain compliant with medical therapy. This would be high risk for stent thrombosis. Continue Brillinta, asa, atorvastatin via feeding tube, we will change iv metoprolol to PO  2. HTN - up to 200, now hypotensive post Precedex, we will start metoprolol and lisinopril  3. Severe systolic LV dysfunction - start lisinopril  4. Paroxysmal a-fib with RVR - continue metoprolol  5. Possible aspiration pneumonia - management per critical care, on  ceftriaxone   Signed, Lars MassonNELSON, Chozen Latulippe H MD, [facility] 10/29/2014

## 2014-10-29 NOTE — Progress Notes (Signed)
eLink Physician-Brief Progress Note Patient Name: [name] DOB: Feb 29, 1946 MRN: [id number]   Date of Service  10/29/2014  HPI/Events of Note   1. Afib with RVR 2. Likely Fever (shaking chills) 3. N + V (non-bloody) 4. HTN: Extremely mild   eICU Interventions   1. Increase metoprolol dose to 50 mg bid, first dose now 2. Culture and X-ray, no immediate need for antipyretics or antibiotics 3. Zofran 4. Monitor given metoprolol use       Intervention Category Intermediate Interventions: Arrhythmia - evaluation and management;OtherCurt Bears:  [name] R. 10/29/2014, 7:48 PM

## 2014-10-29 NOTE — Procedures (Signed)
ELECTROENCEPHALOGRAM REPORT  Patient: [name]       Room #: 8G952H07 EEG No. ID: [id number] Age: 68 y.o.        Sex: male Referring Physician: Michele RockersSKAINS, M Report Date:  10/29/2014        Interpreting Physician: Aline BrochureSTEWART,Moussa Wiegand R  History: Scott MinceJames Collignon is an 68 y.o. male admitted on 10/27/2014 following cardiac arrest and STEMI. Patient has remained on mechanical ventilation with altered mental status indicative of hypoxic encephalopathy  Indications for study:   Assess of vertigo encephalopathy; rule out seizure activity.  Technique: This is an 18 channel routine scalp EEG performed at the bedside with bipolar and monopolar montages arranged in accordance to the international 10/20 system of electrode placement.   Description: Patient was noted to be agitated during this EEG recording. There was considerable movement artifact throughout the record, as a result. Background brain activity consisted of mixed irregular medium to high amplitude diffuse delta and theta activity. Photic stimulation was not performed. No epileptiform discharges were recorded.  Interpretation: This is an abnormal EEG recording with moderately severe generalized continuous nonspecific slowing of cerebral activity, consistent with hypoxic encephalopathy. This pattern of slowing can also be seen with metabolic as well as degenerative encephalopathic processes as well. No evidence of jerking activity was seen.   Venetia MaxonR Boruch Manuele M.D. [facility] [phone]

## 2014-10-29 NOTE — Progress Notes (Signed)
eLink Physician-Brief Progress Note Patient Name: [name] DOB: 08/06/1946 MRN: [id number]   Date of Service  10/29/2014  HPI/Events of Note   Persistent Afib with RVR. Given 50 mg oral metoprolol at ~ 8:20.   eICU Interventions   5 mg IV metoprolol now. RN To contact if rate goes above 130.      Intervention Category Major Interventions: Arrhythmia - evaluation and management  Pearla Mckinny R. 10/29/2014, 9:53 PM

## 2014-10-29 NOTE — Progress Notes (Signed)
Versed 20cc wasted in sink  Fentanyl 175cc wasted in sink  Witnessed by Floydene FlockJoy Olczak, RN

## 2014-10-30 DIAGNOSIS — I2119 ST elevation (STEMI) myocardial infarction involving other coronary artery of inferior wall: Secondary | ICD-10-CM | POA: Diagnosis not present

## 2014-10-30 DIAGNOSIS — R1084 Generalized abdominal pain: Secondary | ICD-10-CM

## 2014-10-30 DIAGNOSIS — R109 Unspecified abdominal pain: Secondary | ICD-10-CM | POA: Insufficient documentation

## 2014-10-30 LAB — GLUCOSE, CAPILLARY
GLUCOSE-CAPILLARY: 67 mg/dL — AB (ref 70–99)
GLUCOSE-CAPILLARY: 111 mg/dL — AB (ref 70–99)
GLUCOSE-CAPILLARY: 71 mg/dL (ref 70–99)
Glucose-Capillary: 100 mg/dL — ABNORMAL HIGH (ref 70–99)
Glucose-Capillary: 103 mg/dL — ABNORMAL HIGH (ref 70–99)
Glucose-Capillary: 64 mg/dL — ABNORMAL LOW (ref 70–99)
Glucose-Capillary: 73 mg/dL (ref 70–99)
Glucose-Capillary: 99 mg/dL (ref 70–99)

## 2014-10-30 LAB — BASIC METABOLIC PANEL
ANION GAP: 15 (ref 5–15)
BUN: 12 mg/dL (ref 6–23)
CALCIUM: 9.5 mg/dL (ref 8.4–10.5)
CO2: 24 mEq/L (ref 19–32)
Chloride: 101 mEq/L (ref 96–112)
Creatinine, Ser: 0.64 mg/dL (ref 0.50–1.35)
GFR calc non Af Amer: >90 mL/min (ref >90)
Glucose, Bld: 94 mg/dL (ref 70–99)
Potassium: 4.2 mEq/L (ref 3.7–5.3)
SODIUM: 140 meq/L (ref 137–147)

## 2014-10-30 LAB — HEPATIC FUNCTION PANEL
ALT: 67 U/L — ABNORMAL HIGH (ref 0–53)
AST: 170 U/L — ABNORMAL HIGH (ref 0–37)
Albumin: 2.9 g/dL — ABNORMAL LOW (ref 3.5–5.2)
Alkaline Phosphatase: 79 U/L (ref 39–117)
Bilirubin, Direct: <0.2 mg/dL (ref 0.0–0.3)
Total Bilirubin: 0.3 mg/dL (ref 0.3–1.2)
Total Protein: 6.4 g/dL (ref 6.0–8.3)

## 2014-10-30 LAB — CBC
HCT: 37.2 % — ABNORMAL LOW (ref 39.0–52.0)
Hemoglobin: 12.6 g/dL — ABNORMAL LOW (ref 13.0–17.0)
MCH: 28.8 pg (ref 26.0–34.0)
MCHC: 33.9 g/dL (ref 30.0–36.0)
MCV: 84.9 fL (ref 78.0–100.0)
Platelets: 200 10*3/uL (ref 150–400)
RBC: 4.38 MIL/uL (ref 4.22–5.81)
RDW: 14.1 % (ref 11.5–15.5)
WBC: 14.2 10*3/uL — ABNORMAL HIGH (ref 4.0–10.5)

## 2014-10-30 LAB — MAGNESIUM: MAGNESIUM: 1.6 mg/dL (ref 1.5–2.5)

## 2014-10-30 LAB — LIPASE, BLOOD: Lipase: 8 U/L — ABNORMAL LOW (ref 11–59)

## 2014-10-30 LAB — AMYLASE: Amylase: 250 U/L — ABNORMAL HIGH (ref 0–105)

## 2014-10-30 MED ORDER — METOPROLOL TARTRATE 1 MG/ML IV SOLN: 2.5000 mg | INTRAVENOUS | Status: DC | PRN

## 2014-10-30 MED ORDER — DEXTROSE 50 % IV SOLN
25.0000 mL | Freq: Once | INTRAVENOUS | Status: AC | PRN
  Administered 2014-10-30: 25 mL via INTRAVENOUS

## 2014-10-30 MED ORDER — AMIODARONE HCL IN DEXTROSE 360-4.14 MG/200ML-% IV SOLN
60.0000 mg/h | INTRAVENOUS | Status: AC
  Administered 2014-10-30 (×2): 60 mg/h via INTRAVENOUS
  Filled 2014-10-30: qty 200

## 2014-10-30 MED ORDER — CHLORHEXIDINE GLUCONATE 0.12 % MT SOLN
15.0000 mL | Freq: Two times a day (BID) | OROMUCOSAL | Status: DC
  Administered 2014-10-31 – 2014-11-05 (×7): 15 mL via OROMUCOSAL
  Filled 2014-10-30 (×13): qty 15

## 2014-10-30 MED ORDER — DEXTROSE 50 % IV SOLN
INTRAVENOUS | Status: AC
  Filled 2014-10-30: qty 50

## 2014-10-30 MED ORDER — SODIUM CHLORIDE 0.9 % IV SOLN
INTRAVENOUS | Status: DC
  Administered 2014-10-30: 5 mL/h via INTRAVENOUS

## 2014-10-30 MED ORDER — AMIODARONE HCL IN DEXTROSE 360-4.14 MG/200ML-% IV SOLN
30.0000 mg/h | INTRAVENOUS | Status: DC
  Administered 2014-10-30 – 2014-11-01 (×4): 30 mg/h via INTRAVENOUS
  Filled 2014-10-30 (×11): qty 200

## 2014-10-30 MED ORDER — METOPROLOL TARTRATE 1 MG/ML IV SOLN
5.0000 mg | Freq: Four times a day (QID) | INTRAVENOUS | Status: DC | PRN
  Administered 2014-10-30: 5 mg via INTRAVENOUS

## 2014-10-30 MED ORDER — METOPROLOL TARTRATE 25 MG PO TABS
25.0000 mg | ORAL_TABLET | Freq: Two times a day (BID) | ORAL | Status: DC
  Administered 2014-10-30: 25 mg via ORAL
  Filled 2014-10-30: qty 1

## 2014-10-30 MED ORDER — AMIODARONE LOAD VIA INFUSION
150.0000 mg | Freq: Once | INTRAVENOUS | Status: AC
  Administered 2014-10-30: 150 mg via INTRAVENOUS
  Filled 2014-10-30: qty 83.34

## 2014-10-30 MED ORDER — PIPERACILLIN-TAZOBACTAM 3.375 G IVPB
3.3750 g | Freq: Three times a day (TID) | INTRAVENOUS | Status: DC
  Administered 2014-10-30 – 2014-11-02 (×9): 3.375 g via INTRAVENOUS
  Filled 2014-10-30 (×11): qty 50

## 2014-10-30 MED ORDER — ASPIRIN 300 MG RE SUPP
300.0000 mg | Freq: Every day | RECTAL | Status: DC
  Administered 2014-10-30 – 2014-10-31 (×2): 300 mg via RECTAL
  Filled 2014-10-30 (×3): qty 1

## 2014-10-30 MED ORDER — CETYLPYRIDINIUM CHLORIDE 0.05 % MT LIQD
7.0000 mL | Freq: Two times a day (BID) | OROMUCOSAL | Status: DC
  Administered 2014-10-31 – 2014-11-03 (×4): 7 mL via OROMUCOSAL

## 2014-10-30 NOTE — Plan of Care (Signed)
Problem: Phase II Progression Outcomes Goal: Hemodynamically stable Outcome: Not Progressing Goal: Active warming per orders Outcome: Completed/Met Date Met:  10/30/14 Goal: No shivering Outcome: Not Progressing Goal: D/C medications per orders Outcome: Progressing Goal: Electrolytes normal or corrected Outcome: Progressing Goal: Pain controlled Outcome: Progressing Goal: Code status addressed with pt/family Outcome: Completed/Met Date Met:  10/30/14 Goal: Extubated and maintains O2 sats > 92% Outcome: Completed/Met Date Met:  10/30/14  Problem: Phase III Progression Outcomes Goal: Mental status at or near baseline Outcome: Progressing Goal: Hemodynamically stable Outcome: Not Progressing

## 2014-10-30 NOTE — Progress Notes (Signed)
Patient Name: [name] Date of Encounter: 10/30/2014  Active Problems:   Cardiac arrest   Inferior MI   Protein-calorie malnutrition, severe   Respiratory failure   STEMI (ST elevation myocardial infarction)   Hypoxic ischemic encephalopathy (HIE)   Length of Stay: 3  SUBJECTIVE  The patient is shivering  extubated this am, agitated, received Precedex, hyper > hypotension. Responsive.   CURRENT MEDS . antiseptic oral rinse  7 mL Mouth Rinse QID  . aspirin  81 mg Oral Daily  . atorvastatin  80 mg Oral q1800  . cefTRIAXone (ROCEPHIN)  IV  1 g Intravenous Q24H  . chlorhexidine  15 mL Mouth Rinse BID  . enoxaparin (LOVENOX) injection  40 mg Subcutaneous Q24H  . Influenza vac split quadrivalent PF  0.5 mL Intramuscular Tomorrow-1000  . ipratropium-albuterol  3 mL Nebulization Q6H  . lisinopril  5 mg Oral Daily  . metoprolol tartrate  50 mg Oral BID  . pantoprazole (PROTONIX) IV  40 mg Intravenous QHS  . pneumococcal 23 valent vaccine  0.5 mL Intramuscular Tomorrow-1000  . ticagrelor  90 mg Per Tube BID   OBJECTIVE  Filed Vitals:   10/30/14 0530 10/30/14 0600 10/30/14 0630 10/30/14 0728  BP:  132/68    Pulse: 117 123 88   Temp:   98.8 F (37.1 C)   TempSrc:   Other (Comment)   Resp: 19 21 17    Height:      Weight:      SpO2: 100% 98% 97% 95%    Intake/Output Summary (Last 24 hours) at 10/30/14 0740 Last data filed at 10/30/14 0700  Gross per 24 hour  Intake 1901.15 ml  Output   2700 ml  Net -798.85 ml   Filed Weights   10/28/14 0500 10/29/14 0500 10/30/14 0500  Weight: 158 lb 1.1 oz (71.7 kg) 158 lb 15.2 oz (72.1 kg) 149 lb 7.6 oz (67.8 kg)    PHYSICAL EXAM  General: Pleasant, NAD. Neuro: Alert and oriented X 3. Moves all extremities spontaneously. Psych: Normal affect. HEENT:  Normal  Neck: Supple without bruits or JVD. Lungs:  Resp regular and unlabored, CTA. Heart: RRR no s3, s4, or murmurs. Abdomen: Soft, non-tender, non-distended, BS + x 4.   Extremities: No clubbing, cyanosis or edema. DP/PT/Radials 2+ and equal bilaterally.  Accessory Clinical Findings  CBC  Recent Labs  10/29/14 0541 10/30/14 0350  WBC 9.1 14.2*  HGB 13.1 12.6*  HCT 39.0 37.2*  MCV 85.2 84.9  PLT 207 200   Basic Metabolic Panel  Recent Labs  10/29/14 0541 10/30/14 0350  NA 137 140  K 4.5 4.2  CL 100 101  CO2 24 24  GLUCOSE 99 94  BUN 10 12  CREATININE 0.79 0.64  CALCIUM 8.9 9.5  MG  --  1.6   Liver Function Tests  Recent Labs  10/27/14 1320 10/29/14 0541  AST 147* 163*  ALT 108* 85*  ALKPHOS 103 92  BILITOT 1.1 0.5  PROT 7.1 6.8  ALBUMIN 3.7 3.2*   No results for input(s): LIPASE, AMYLASE in the last 72 hours. Cardiac Enzymes  Recent Labs  10/27/14 1611 10/27/14 2145 10/28/14 0406  TROPONINI 2.30* 2.15* 1.53*    Recent Labs  10/29/14 0542  CHOL 180  HDL 55  LDLCALC 116*  TRIG 46  CHOLHDL 3.3   Radiology/Studies Dg Chest Port 1 View  10/29/2014   CLINICAL DATA:  68 year old male currently admitted with acute respiratory failure.  IMPRESSION: 1. Developing hazy  opacity in the medial right lower lobe concerning for pneumonia or aspiration. 2. Trace left pleural effusion with associated atelectasis. 3. Stable borderline cardiomegaly. 4. Stable position of endotracheal tube. 5. The tip of the nasogastric tube overlies the upper stomach just beyond the GE junction. Consider advancing several cm.   Electronically Signed   By: Malachy MoanHeath  McCullough M.D.   On: 10/29/2014 07:42    Dg Abd Portable 1v  10/29/2014   CLINICAL DATA:  Nasogastric tube placement  IMPRESSION: Tip of NG tube projects over the body of the stomach. Consider advancing 3-5 cm for placement of the side hole distal to the GE junction.   Electronically Signed   By: Christiana PellantGretchen  Green M.D.   On: 10/29/2014 09:50   Cardiac cath: 10/27/14 Impression: 1. Inferior STEMI secondary to occluded distal RCA 2. Out of hospital cardiac arrest (venticular  fibrillation) 3. Diffuse heavy calcification in the entire RCA with multiple segments of severe stenosis.  4. Severe LV systolic dysfunction, LVEF=25-30% with inferior hypokinesis  Recommendations: He will be managed in the CCU. To head CT now per PCCM. If no IC bleeding, plans for cooling protocol. In regards to his cardiac disease, will run Aggrastat drip for 18 hours. Will load Brilinta 180 mg x 1 per NG tube. Will start Brilinta 90 mg po BID after that with first dose tonight. Will start statin, beta blocker (tachycardia and HTN). Continue ASA. Echo tomorrow. Ventilator management per PCCM. Long term plans for management of the RCA are complex. I was able to restore antegrade flow down the vessel with balloon inflations but could not fully inflate balloons in the mid segment due to heavy calcification. If he has full neurological recovery, can consider PCI of the RCA with rotablator atherectomy, however, the downside to this would be the need for a very long segment of overlapping stents in a patient who at this time may not have the ability to remain compliant with medical therapy. This would be high risk for stent thrombosis.    TELE: in and out of a-fib with RVR     ASSESSMENT AND PLAN  [name] from homeless shelter after witnessed V fib arrest due to STEMI, intubated, post ACLS inc DCCV, emergent cath, severe RCA stenosis cooled and rewarmed, now extubated, his mentation is improving, he can tell his name and DOB.   1. S/P STEMI, cath severe RCA disease, with some restoration of flow, would consider PCI of the RCA with rotablator atherectomy, however, the downside to this would be the need for a very long segment of overlapping stents in a patient who at this time may not have the ability to remain compliant with medical therapy. This would be high risk for stent thrombosis. Continue Brillinta, asa, atorvastatin via feeding tube, we will change iv metoprolol to PO.  2. HTN -  labile BP, hypertensive when agitated, then hypotensive with sedation, we will continue low dose of metoprolol and lisinopril for now  3. Severe systolic LV dysfunction - on lisinopril  4. Paroxysmal a-fib with RVR - continue metoprolol, however low BP, we will start amiodarone drip  5. Possible aspiration pneumonia - management per critical care, on ceftriaxone   Signed, Lars MassonNELSON, Cristiano Capri H MD, [facility] 10/30/2014

## 2014-10-30 NOTE — Progress Notes (Signed)
MD notified of current BS

## 2014-10-30 NOTE — Progress Notes (Signed)
[facility] was updated on pt heart rate range of 125 to 145. MD was also informed of pt continuous shivering that was present throughout the shift, NG secretion, and of two hypoglycemia events. Orders received.

## 2014-10-30 NOTE — Progress Notes (Signed)
PULMONARY / CRITICAL CARE MEDICINE   Name: Smith MinceJames Paino MRN: [id number] DOB: [birth year]    ADMISSION DATE:  10/27/2014   INITIAL PRESENTATION:  Ad[facility] as [name] from homeless shelter after witnessed V fib arrest due to STEMI, intubated, post ACLS inc DCCV, emergent cath   SIGNIFICANT EVENTS/STUDIES: 11/05 Vfib arrest with 5- 15 minutes down time. DCCV X 4 reported 11/05 LHC: Inferior STEMI secondary to occluded distal RCA. Diffuse heavy calcification in the entire RCA with multiple segments of severe stenosis. Severe LV systolic dysfunction 11/05 CT head: NAD 11/06 TTE: LVEF 20-25%. AK of basal-midinferior myocardium. Grade 1 DD 11/06 EEG: mod-severe generalized slowing without epileptiform discharges 11/07 Passed SBT. Very agitated and not F/C on WUA. Extubated and no distress post extubation. dexmedetomidine gtt ordered 11/08 Has tolerated extubation. Cognition much improved. Abdominal exam abnormal/firm with diminished BS. High NGT output. Amylase/lipase/LFTs ordered  INDWELLING DEVICES:: ETT 11/05 >> 11/07 L radial A line 11/05 >> 11/08 R femoral venous sheath 11/05 >> 11/07  MICRO DATA: Urine 11/05 >> NEG Resp 11/07  >>  Blood 11/05 >>  Urine 11/07 >>  Blood 11/07 >>    ANTIMICROBIALS:  Ceftriaxone 11/07 >> 11/08 Pip-tazo 11/08 >>   SUBJECTIVE:  RASS -1. + F/C per RN. Not verbalizing any complaints   VITAL SIGNS: Temp:  [98.1 F (36.7 C)-99.1 F (37.3 C)] 98.9 F (37.2 C) (11/08 1600) Pulse Rate:  [25-134] 99 (11/08 1800) Resp:  [11-28] 16 (11/08 1800) BP: (102-181)/(67-99) 112/74 mmHg (11/08 1800) SpO2:  [93 %-100 %] 97 % (11/08 1800) Arterial Line BP: (136-179)/(55-81) 149/68 mmHg (11/08 1500) Weight:  [67.8 kg (149 lb 7.6 oz)] 67.8 kg (149 lb 7.6 oz) (11/08 0500) HEMODYNAMICS:   VENTILATOR SETTINGS:   INTAKE / OUTPUT:  Intake/Output Summary (Last 24 hours) at 10/30/14 1840 Last data filed at 10/30/14 1800  Gross per 24 hour  Intake 1952.85 ml   Output   2975 ml  Net -1022.15 ml    PHYSICAL EXAMINATION: General:  cachectic, appears uncomfortable with grimacing Neuro: MAEs, cognition improved but not F/C presently HEENT:  NCAT, PERRL Cardiovascular: REG, No M Lungs: few ronchi Abdomen Firm, mildly to moderately tender throughout, diminished to absent BS Ext: warm, no edema  LABS: I have reviewed all of today's lab results. Relevant abnormalities are discussed in the A/P section  CXR: NNF  ASSESSMENT / PLAN:  PULMONARY A: VDRF post VF arrest Suspect early HCAP P:   Monitor in ICU post extubation Supp O2 to maintain SpO2 > 92% NTS PRN  CARDIOVASCULAR A:  VF arrest Inf wall STEMI RCA stenosis, could not be stented Hypertension, controlled Severe dilated CM P:  Cards managing MAP goal 60 mmhg  RENAL A:   Hypernatremia, resolved Hypokalemia, resolved P:   Monitor BMET intermittently Monitor I/Os Correct electrolytes as indicated  GASTROINTESTINAL A:   Protein-calorie malnutrition Gastric ileus Abdominal pain P:   SUP: IV PPI Cont NGT to suction Amylase, lipase, LFTs 11/08 2V abd Xray ordered for AM 11/09 Consider abd US vs CT if exam doesn't improve 11/09  HEMATOLOGIC A:   No acute issues P:  DVT px: enox SQ Monitor CBC intermittently Transfuse per usual ICU guidelines  INFECTIOUS A:   Suspect early HCAP Concern for abdominal infection P:   Micro and abx as above  ENDOCRINE A:  Mild hyperglycemia, resolved P:   Monitor glu on chem panels  NEUROLOGIC A:   Post anoxic acute encephalopathy ICU/vent associated discomfort Pain P:   RASS  goal: 0 Cont low dose PRN fent   FAMILY  - Updates: No family @ bedside  - Inter-disciplinary family meet or Palliative Care meeting due by:  11/03/2014    TODAY'S SUMMARY:    Billy Fischeravid Jarrick Fjeld, MD ; [facility] service Mobile [phone].  After 5:30 PM or weekends, call [phone]

## 2014-10-30 NOTE — Progress Notes (Signed)
Left radial Aline DC'd per order. Manuel pressure held for 15 min. Pressure dressing applied

## 2014-10-31 ENCOUNTER — Inpatient Hospital Stay (HOSPITAL_COMMUNITY): Payer: Medicare Other

## 2014-10-31 DIAGNOSIS — R1010 Upper abdominal pain, unspecified: Secondary | ICD-10-CM

## 2014-10-31 DIAGNOSIS — Z9861 Coronary angioplasty status: Secondary | ICD-10-CM

## 2014-10-31 DIAGNOSIS — E876 Hypokalemia: Secondary | ICD-10-CM

## 2014-10-31 DIAGNOSIS — I4891 Unspecified atrial fibrillation: Secondary | ICD-10-CM

## 2014-10-31 DIAGNOSIS — D62 Acute posthemorrhagic anemia: Secondary | ICD-10-CM

## 2014-10-31 DIAGNOSIS — E785 Hyperlipidemia, unspecified: Secondary | ICD-10-CM

## 2014-10-31 DIAGNOSIS — R7303 Prediabetes: Secondary | ICD-10-CM

## 2014-10-31 DIAGNOSIS — I469 Cardiac arrest, cause unspecified: Secondary | ICD-10-CM | POA: Insufficient documentation

## 2014-10-31 DIAGNOSIS — R7401 Elevation of levels of liver transaminase levels: Secondary | ICD-10-CM

## 2014-10-31 DIAGNOSIS — I2510 Atherosclerotic heart disease of native coronary artery without angina pectoris: Secondary | ICD-10-CM

## 2014-10-31 DIAGNOSIS — F1410 Cocaine abuse, uncomplicated: Secondary | ICD-10-CM

## 2014-10-31 DIAGNOSIS — I5042 Chronic combined systolic (congestive) and diastolic (congestive) heart failure: Secondary | ICD-10-CM

## 2014-10-31 DIAGNOSIS — I4901 Ventricular fibrillation: Secondary | ICD-10-CM

## 2014-10-31 DIAGNOSIS — I480 Paroxysmal atrial fibrillation: Secondary | ICD-10-CM

## 2014-10-31 DIAGNOSIS — R740 Nonspecific elevation of levels of transaminase and lactic acid dehydrogenase [LDH]: Secondary | ICD-10-CM

## 2014-10-31 LAB — CBC
HCT: 32.3 % — ABNORMAL LOW (ref 39.0–52.0)
Hemoglobin: 10.7 g/dL — ABNORMAL LOW (ref 13.0–17.0)
MCH: 28 pg (ref 26.0–34.0)
MCHC: 33.1 g/dL (ref 30.0–36.0)
MCV: 84.6 fL (ref 78.0–100.0)
PLATELETS: 172 10*3/uL (ref 150–400)
RBC: 3.82 MIL/uL — ABNORMAL LOW (ref 4.22–5.81)
RDW: 14 % (ref 11.5–15.5)
WBC: 8.4 10*3/uL (ref 4.0–10.5)

## 2014-10-31 LAB — HEMOGLOBIN A1C
Hgb A1c MFr Bld: 5.8 % — ABNORMAL HIGH (ref <5.7)
Mean Plasma Glucose: 120 mg/dL — ABNORMAL HIGH (ref <117)

## 2014-10-31 LAB — COMPREHENSIVE METABOLIC PANEL
ALT: 58 U/L — ABNORMAL HIGH (ref 0–53)
AST: 161 U/L — AB (ref 0–37)
Albumin: 2.8 g/dL — ABNORMAL LOW (ref 3.5–5.2)
Alkaline Phosphatase: 73 U/L (ref 39–117)
Anion gap: 13 (ref 5–15)
BILIRUBIN TOTAL: 0.5 mg/dL (ref 0.3–1.2)
BUN: 14 mg/dL (ref 6–23)
CALCIUM: 8.9 mg/dL (ref 8.4–10.5)
CO2: 27 meq/L (ref 19–32)
CREATININE: 0.79 mg/dL (ref 0.50–1.35)
Chloride: 98 mEq/L (ref 96–112)
GFR calc Af Amer: >90 mL/min (ref >90)
GLUCOSE: 158 mg/dL — AB (ref 70–99)
Potassium: 3.1 mEq/L — ABNORMAL LOW (ref 3.7–5.3)
Sodium: 138 mEq/L (ref 137–147)
Total Protein: 6 g/dL (ref 6.0–8.3)

## 2014-10-31 LAB — CBC WITH DIFFERENTIAL/PLATELET
BASOS PCT: 0 % (ref 0–1)
Basophils Absolute: 0 10*3/uL (ref 0.0–0.1)
EOS ABS: 0 10*3/uL (ref 0.0–0.7)
Eosinophils Relative: 0 % (ref 0–5)
HEMATOCRIT: 33.6 % — AB (ref 39.0–52.0)
HEMOGLOBIN: 11.3 g/dL — AB (ref 13.0–17.0)
Lymphocytes Relative: 7 % — ABNORMAL LOW (ref 12–46)
Lymphs Abs: 0.7 10*3/uL (ref 0.7–4.0)
MCH: 29.1 pg (ref 26.0–34.0)
MCHC: 33.6 g/dL (ref 30.0–36.0)
MCV: 86.6 fL (ref 78.0–100.0)
MONOS PCT: 7 % (ref 3–12)
Monocytes Absolute: 0.7 10*3/uL (ref 0.1–1.0)
NEUTROS ABS: 8.2 10*3/uL — AB (ref 1.7–7.7)
Neutrophils Relative %: 86 % — ABNORMAL HIGH (ref 43–77)
Platelets: 175 10*3/uL (ref 150–400)
RBC: 3.88 MIL/uL — ABNORMAL LOW (ref 4.22–5.81)
RDW: 14.1 % (ref 11.5–15.5)
WBC: 9.6 10*3/uL (ref 4.0–10.5)

## 2014-10-31 LAB — LACTATE DEHYDROGENASE: LDH: 413 U/L — ABNORMAL HIGH (ref 94–250)

## 2014-10-31 LAB — GLUCOSE, CAPILLARY
GLUCOSE-CAPILLARY: 105 mg/dL — AB (ref 70–99)
Glucose-Capillary: 101 mg/dL — ABNORMAL HIGH (ref 70–99)
Glucose-Capillary: 115 mg/dL — ABNORMAL HIGH (ref 70–99)
Glucose-Capillary: 117 mg/dL — ABNORMAL HIGH (ref 70–99)
Glucose-Capillary: 99 mg/dL (ref 70–99)

## 2014-10-31 LAB — URINE CULTURE: Colony Count: 50000

## 2014-10-31 LAB — HEPATITIS PANEL, ACUTE
HCV AB: NEGATIVE
Hep A IgM: NONREACTIVE
Hep B C IgM: NONREACTIVE
Hepatitis B Surface Ag: NEGATIVE

## 2014-10-31 LAB — PROCALCITONIN: PROCALCITONIN: 2.31 ng/mL

## 2014-10-31 LAB — LACTIC ACID, PLASMA: Lactic Acid, Venous: 1.1 mmol/L (ref 0.5–2.2)

## 2014-10-31 MED ORDER — BISACODYL 5 MG PO TBEC: 5.0000 mg | DELAYED_RELEASE_TABLET | Freq: Every day | ORAL | Status: DC | PRN

## 2014-10-31 MED ORDER — CARVEDILOL 3.125 MG PO TABS
3.1250 mg | ORAL_TABLET | Freq: Two times a day (BID) | ORAL | Status: DC
  Administered 2014-10-31 – 2014-11-01 (×3): 3.125 mg via ORAL
  Filled 2014-10-31 (×5): qty 1

## 2014-10-31 MED ORDER — POTASSIUM CHLORIDE 10 MEQ/100ML IV SOLN
10.0000 meq | Freq: Once | INTRAVENOUS | Status: AC
  Administered 2014-10-31: 10 meq via INTRAVENOUS
  Filled 2014-10-31: qty 100

## 2014-10-31 MED ORDER — LORAZEPAM 2 MG/ML IJ SOLN
0.5000 mg | Freq: Two times a day (BID) | INTRAMUSCULAR | Status: DC
  Administered 2014-10-31 – 2014-11-02 (×5): 0.5 mg via INTRAVENOUS
  Filled 2014-10-31 (×6): qty 1

## 2014-10-31 MED ORDER — POTASSIUM CHLORIDE 10 MEQ/100ML IV SOLN
10.0000 meq | INTRAVENOUS | Status: AC
  Administered 2014-10-31 (×4): 10 meq via INTRAVENOUS
  Filled 2014-10-31 (×3): qty 100

## 2014-10-31 MED ORDER — POTASSIUM CHLORIDE 10 MEQ/100ML IV SOLN
10.0000 meq | INTRAVENOUS | Status: AC
  Administered 2014-10-31: 10 meq via INTRAVENOUS
  Filled 2014-10-31 (×2): qty 100

## 2014-10-31 MED ORDER — LORAZEPAM 2 MG/ML IJ SOLN
1.0000 mg | Freq: Once | INTRAMUSCULAR | Status: AC
  Administered 2014-10-31: 1 mg via INTRAVENOUS
  Filled 2014-10-31: qty 1

## 2014-10-31 NOTE — Evaluation (Signed)
Physical Therapy Evaluation Patient Details Name: Smith MinceJames Austill MRN: [id number] DOB: 12-06-1946 Today's Date: 10/31/2014   History of Present Illness  Patient is a 68 yo male who sufferred a witnessed V fib arrest due to STEMI, intubated, post ACLS inc DCCV, emergent cath    Clinical Impression  Patient demonstrates deficits in functional mobility as indicated below. Will need continued skilled PT to address deficits and maximize function. Will see as indicated and progress as tolerated.  OF NOTE: Patient with difficulty sequencing and carrying out tasks, question if related to anoxia?? May benefit from comprehensive inpatient therapy upon acute dc.     Follow Up Recommendations CIR    Equipment Recommendations  Other (comment) (TBD)    Recommendations for Other Services Rehab consult     Precautions / Restrictions Precautions Precautions: Fall Restrictions Weight Bearing Restrictions: No      Mobility  Bed Mobility Overal bed mobility: Needs Assistance Bed Mobility: Rolling;Sidelying to Sit Rolling: Min assist Sidelying to sit: Mod assist       General bed mobility comments: VCs for positioning and sequencing, patient very delayed with processing  Transfers Overall transfer level: Needs assistance Equipment used: 2 person hand held assist Transfers: Sit to/from Stand;Stand Pivot Transfers Sit to Stand: Mod assist;+2 physical assistance Stand pivot transfers: Mod assist;+2 physical assistance       General transfer comment: Patient with poor ability to come to upright, difficulty with balance, moderate assist for stability and cues required to initiate movement for pivot to chair  Ambulation/Gait Ambulation/Gait assistance: Mod assist Ambulation Distance (Feet): 8 Feet Assistive device: 2 person hand held assist Gait Pattern/deviations: Step-to pattern;Shuffle Gait velocity: decreased   General Gait Details: pivotal steps to chair  Stairs             Wheelchair Mobility    Modified Rankin (Stroke Patients Only)       Balance Overall balance assessment: Needs assistance Sitting-balance support: Feet supported Sitting balance-Leahy Scale: Fair     Standing balance support: Bilateral upper extremity supported Standing balance-Leahy Scale: Poor                               Pertinent Vitals/Pain Pain Assessment: 0-10 Pain Score: 6  Pain Location: chest Pain Descriptors / Indicators: Constant;Sore Pain Intervention(s): Monitored during session;Limited activity within patient's tolerance;Repositioned    Home Living Family/patient expects to be discharged to:: Private residence Living Arrangements: Non-relatives/Friends Available Help at Discharge: Friend(s) Type of Home: House Home Access: Stairs to enter Entrance Stairs-Rails: None Secretary/administratorntrance Stairs-Number of Steps: 3 Home Layout: One level Home Equipment: None      Prior Function Level of Independence: Independent               Hand Dominance        Extremity/Trunk Assessment                         Communication   Communication: Expressive difficulties;HOH (soft vocalizations )  Cognition Arousal/Alertness: Awake/alert Behavior During Therapy: Flat affect Overall Cognitive Status: Impaired/Different from baseline Area of Impairment: Attention;Safety/judgement;Awareness;Problem solving   Current Attention Level: Sustained Memory: Decreased short-term memory   Safety/Judgement: Decreased awareness of safety;Decreased awareness of deficits Awareness: Intellectual Problem Solving: Slow processing;Decreased initiation;Difficulty sequencing;Requires verbal cues;Requires tactile cues General Comments: patient had difficulty sequencing basic task of getting out of bed, required cognitive assist and cues for positioning    General Comments  Exercises        Assessment/Plan    PT Assessment Patient needs continued PT  services  PT Diagnosis Difficulty walking;Abnormality of gait;Generalized weakness;Acute pain;Altered mental status   PT Problem List Decreased strength;Decreased activity tolerance;Decreased balance;Decreased mobility;Decreased cognition;Decreased knowledge of use of DME;Decreased safety awareness;Cardiopulmonary status limiting activity;Pain  PT Treatment Interventions DME instruction;Gait training;Stair training;Functional mobility training;Therapeutic activities;Therapeutic exercise;Balance training;Patient/family education   PT Goals (Current goals can be found in the Care Plan section) Acute Rehab PT Goals Patient Stated Goal: none stated PT Goal Formulation: With patient Time For Goal Achievement: 11/14/14 Potential to Achieve Goals: Good    Frequency Min 3X/week   Barriers to discharge Decreased caregiver support      Co-evaluation               End of Session Equipment Utilized During Treatment: Gait belt;Oxygen Activity Tolerance: Patient tolerated treatment well;Patient limited by fatigue Patient left: in chair;with call bell/phone within reach;with nursing/sitter in room Nurse Communication: Mobility status;Precautions         Time: [id number] PT Time Calculation (min): 27 min   Charges:   PT Evaluation $Initial PT Evaluation Tier I: 1 Procedure PT Treatments $Therapeutic Activity: 23-37 mins   PT G CodesFabio Asa:          Albertha Beattie J 10/31/2014, 3:44 PM Charlotte Crumbevon Carline Dura, PT DPT  [phone]

## 2014-10-31 NOTE — Progress Notes (Signed)
CARDIAC REHAB PHASE I   PRE:  Rate/Rhythm: 86 SR/100 afib    BP: sitting 125/80    SaO2: 96 RA  MODE:  Ambulation: 150 ft   POST:  Rate/Rhythm: 106 SR/Afib    BP: sitting 146/82     SaO2: 94 2L  Pt weak and in pain. Points to upper abdomen, lower chest. Trembling in bed. Assist x2 to stand. Hunched over due to pain in chest, clutching chest. Able to walk 150 ft with rollator, gait belt, assist x2. Used supplemental O2 2L. Pts feet turn inward, prob somewhat normal for him. To bed after walk due to tx to u/s. In and out of afib. RN caring for pt. Will f/u. [phone]   Elissa LovettReeve, Hendrix Console Winter ParkKristan CES, ACSM 10/31/2014 10:04 AM

## 2014-10-31 NOTE — Care Management Note (Addendum)
    Page 1 of 1   11/03/2014     4:39:56 PM CARE MANAGEMENT NOTE 11/03/2014  Patient:  [name],[name]   Account Number:  [id number]  Date Initiated:  10/31/2014  Documentation initiated by:  Junius CreamerWELL,DEBBIE  Subjective/Objective Assessment:   adm w cardiac arrest     Action/Plan:   homeless pta   Anticipated DC Date:  11/04/2014   Anticipated DC Plan:  SKILLED NURSING FACILITY  In-house referral  Clinical Social Worker      DC Planning Services  CM consult      Choice offered to / List presented to:             Status of service:   Medicare Important Message given?  YES (If response is "NO", the following Medicare IM given date fields will be blank) Date Medicare IM given:  10/31/2014 Medicare IM given by:  Junius CreamerWELL,DEBBIE Date Additional Medicare IM given:  11/03/2014 Additional Medicare IM given by:  Ger Ringenberg  Discharge Disposition:    Per UR Regulation:  Reviewed for med. necessity/level of care/duration of stay  If discussed at Long Length of Stay Meetings, dates discussed:   11/01/2014    Comments:  11/01/14 Sidney AceJulie Kelis Plasse, RN, BSN [phone] CSW following for likely SNF placement, as pt will likely need SNF for rehab at dc.  Will follow progress.

## 2014-10-31 NOTE — Progress Notes (Signed)
Pt agitated. Pulled out IV and EKG leads. MD notified. Pt c/o being hungry. Abdominal U/S and abd x-ray showed no signs of obstruction. Diet order placed. Pt's swallowing observed with water. No signs of intolerance. Pt provided with orange sherbert.

## 2014-10-31 NOTE — Plan of Care (Signed)
Problem: Phase III Progression Outcomes Goal: Mental status at or near baseline Outcome: Completed/Met Date Met:  10/31/14

## 2014-10-31 NOTE — Progress Notes (Signed)
Following pt post pci. Would presume pt would benefit from PT c/s. Please order if agreed. Ethelda ChickKristan Lazette Estala CES, ACSM 7:45 AM 10/31/2014

## 2014-10-31 NOTE — Progress Notes (Signed)
eLink Physician-Brief Progress Note Patient Name: [name] DOB: [birth year] MRN: [id number]   Date of Service  10/31/2014  HPI/Events of Note    eICU Interventions  Diet ordered     Intervention Category Evaluation Type: Other  [name] S. 10/31/2014, 8:29 PM

## 2014-10-31 NOTE — Progress Notes (Signed)
Rehab Admissions Coordinator Note:  Patient was screened by Trish MageLogue, Veasna Santibanez M for appropriateness for an Inpatient Acute Rehab Consult.  At this time, we are recommending Inpatient Rehab consult.  Trish MageLogue, Arryana Tolleson M 10/31/2014, 3:57 PM  I can be reached at [phone].

## 2014-10-31 NOTE — Progress Notes (Signed)
PULMONARY / CRITICAL CARE MEDICINE   Name: [name] MRN: [id number] DOB: 1946-02-19    ADMISSION DATE:  10/27/2014   INITIAL PRESENTATION:  witnessed V fib arrest due to STEMI, intubated, post ACLS inc DCCV, emergent cath   SIGNIFICANT EVENTS/STUDIES: 11/05 Vfib arrest with 5- 15 minutes down time. DCCV X 4 reported 11/05 LHC: Inferior STEMI secondary to occluded distal RCA. Diffuse heavy calcification in the entire RCA with multiple segments of severe stenosis. Severe LV systolic dysfunction 11/05 CT head: NAD 11/06 TTE: LVEF 20-25%. AK of basal-midinferior myocardium. Grade 1 DD 11/06 EEG: mod-severe generalized slowing without epileptiform discharges 11/07 Passed SBT. Very agitated and not F/C on WUA. Extubated and no distress post extubation. dexmedetomidine gtt ordered 11/08 Has tolerated extubation. Cognition much improved. Abdominal exam abnormal/firm with diminished BS. High NGT output. Amylase/lipase/LFTs ordered 11/8 amio for fib 11/9 - high output from NGT   INDWELLING DEVICES:: ETT 11/05 >> 11/07 L radial A line 11/05 >> 11/08 R femoral venous sheath 11/05 >> 11/07  MICRO DATA: Urine 11/05 >> NEG Resp 11/07  >>  Blood 11/05 >>  Urine 11/07 >>  Blood 11/07 >>    ANTIMICROBIALS:  Ceftriaxone 11/07 >> 11/08 Pip-tazo 11/08 >>   SUBJECTIVE:  Intact, abdo pain reported  VITAL SIGNS: Temp:  [98.5 F (36.9 C)-99 F (37.2 C)] 98.9 F (37.2 C) (11/09 0730) Pulse Rate:  [74-107] 88 (11/09 0800) Resp:  [11-31] 28 (11/09 0800) BP: (102-141)/(61-85) 102/78 mmHg (11/09 0800) SpO2:  [94 %-100 %] 94 % (11/09 0800) Arterial Line BP: (138-160)/(58-76) 149/68 mmHg (11/08 1500) Weight:  [66.2 kg (145 lb 15.1 oz)] 66.2 kg (145 lb 15.1 oz) (11/09 0500) HEMODYNAMICS:   VENTILATOR SETTINGS:   INTAKE / OUTPUT:  Intake/Output Summary (Last 24 hours) at 10/31/14 0905 Last data filed at 10/31/14 [id number]  Gross per 24 hour  Intake 2787.45 ml  Output   1900 ml  Net  887.45 ml    PHYSICAL EXAMINATION: General:  cachectic, awake Neuro: follows commands, moves all ext equal HEENT:  NCAT, PERRL Cardiovascular: REG, No M Lungs: few ronchi resolved, slight coarse Abdomen Firm, mild to no distention, tenderness diffuse, nor/g, BS wnl Ext: warm, no edema  LABS: I have reviewed all of today's lab results. 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 Hypertension, controlled Severe dilated CM P:  Cards managing amio drip  RENAL A:   Hypernatremia, resolved Hypokalemia P:   K supp IV Chem in am  D51/2 NS at 75, continued  GASTROINTESTINAL A:   Protein-calorie malnutrition Gastric ileus Abdominal pain - ileus, constipation likley P:   SUP: IV PPI Consider abd US, assessment Enema, dulc, myrlax Lactic acid, ldh Keep k greater 4, mag greater 2  HEMATOLOGIC A:   MI, CAD P:  DVT px: enox SQ Bril per cards, will need restart once PO intak eOK   INFECTIOUS A:   Concern for abdominal infection P:   Zosyn ok for now, consider dc in 48 hr if no infectionnoted  ENDOCRINE A:  Mild hyperglycemia, resolved P:   Monitor glu on chem panels  NEUROLOGIC A:   Post anoxic acute encephalopathy ICU/vent associated discomfort Pain P:   RASS goal: 0 Add pt, ot, ambulate  if able   FAMILY  - Updates: I updated 11/9, pt, brother and son  To traid tele Social work needed per son, placement once dc  - Inter-disciplinary family meet or Palliative Care meeting due by:  11/03/2014  [name] AliasFeinstein, MD, FACP Pgr: [phone] [facility]

## 2014-10-31 NOTE — Progress Notes (Signed)
eLink Physician-Brief Progress Note Patient Name: [name] DOB: Jan 26, 1946 MRN: [id number]   Date of Service  10/31/2014  HPI/Events of Note    eICU Interventions  Additional dose ativan given for increased agitation     Intervention Category Major Interventions: Delirium, psychosis, severe agitation - evaluation and management  Britnie Colville S. 10/31/2014, 8:09 PM

## 2014-10-31 NOTE — Progress Notes (Signed)
Noted that pt pulled out right A/C PIV. Pt states I want to go home. Pt given calm reassurance- family at bedside. Pt refused new PIV at this time. Explained to pt the need for his medication. Second IV in place and infusing without difficulty. Family assisting with orientation and calming pt. Will continue to monitor pt.

## 2014-10-31 NOTE — Progress Notes (Signed)
Walked into pt's room at 0400 and NGT was dislodged from pt's nose. Last seen intact at approximately 0315. Unclear as to whether pt purposefully removed it or not. Attempted to reinsert new NGT and pt adamantly refused, and resisted by pushing staff away. Explained to pt why he needs the tube but pt still refused. Will notify MD and continue to monitor. Gregor HamsBINGHAM, Triton Heidrich, RN

## 2014-10-31 NOTE — Progress Notes (Signed)
PT Cancellation Note  Patient Details Name: Smith MinceJames Yuan MRN: [id number] DOB: March 12, 1946   Cancelled Treatment:    Reason Eval/Treat Not Completed: Patient at procedure or test/unavailable   Fabio AsaWerner, Arush Gatliff J 10/31/2014, 2:46 PM Charlotte Crumbevon Lagena Strand, PT DPT  [phone]

## 2014-10-31 NOTE — Progress Notes (Signed)
[facility] FOR AM LAB REPLACEMENT ONLY  The patient does apply for the [facility] Adult [facility] Electrolyte Replacment Protocol based on the criteria listed below:   1. Is GFR >/= 40 ml/min? Yes.    Patient's GFR today is >90 2. Is urine output >/= 0.5 ml/kg/hr for the last 6 hours? Yes.   Patient's UOP is 0.55 ml/kg/hr 3. Is BUN < 60 mg/dL? Yes.    Patient's BUN today is 14 4. Abnormal electrolyte(s): K 3.1 5. Ordered repletion with: [facility] adult ICU replacement protocol 6. If a panic level lab has been reported, has the CCM MD in charge been notified? Yes.  .   Physician:  Dr. Lanora ManisElizabeth Deterding  [name], [name] 10/31/2014 5:21 AM

## 2014-10-31 NOTE — Progress Notes (Signed)
SUBJECTIVE:  Pt seen and examined in AM. No acute events on telemetry overnight. Pt currently in NSR with normal HR and stable blood pressure. He is tremulous and has chest tenderness. He denies dyspnea, palpitations, or lightheadedness.    OBJECTIVE:   Vitals:   Filed Vitals:   10/31/14 0730 10/31/14 0800 10/31/14 0900 10/31/14 1000  BP:  102/78 108/89 146/82  Pulse:  88 92 85  Temp: 98.9 F (37.2 C)     TempSrc: Oral     Resp:  28 27 23   Height:      Weight:      SpO2:  94% 92% 90%   I&O's:    Intake/Output Summary (Last 24 hours) at 10/31/14 1059 Last data filed at 10/31/14 1000  Gross per 24 hour  Intake 2759.25 ml  Output   1900 ml  Net 859.25 ml   TELEMETRY: Reviewed telemetry pt in NSR:    PHYSICAL EXAM General: No acute distress Head:   Normal cephalic and atramatic  Lungs:  Clear to auscultation in anterior fields. Heart:  HRRR S1 S2  No JVD. Tenderness to palpation of anterior chest.  Abdomen: Abdomen soft and non-tender Msk:  Back normal,  Normal strength and tone for age. Extremities:  No LE edema Neuro:  Alert and orientated   Skin: No rash   LABS: Basic Metabolic Panel:  Recent Labs  16/10/96 0350 10/31/14 0254  NA 140 138  K 4.2 3.1*  CL 101 98  CO2 24 27  GLUCOSE 94 158*  BUN 12 14  CREATININE 0.64 0.79  CALCIUM 9.5 8.9  MG 1.6  --    Liver Function Tests:  Recent Labs  10/30/14 1410 10/31/14 0254  AST 170* 161*  ALT 67* 58*  ALKPHOS 79 73  BILITOT 0.3 0.5  PROT 6.4 6.0  ALBUMIN 2.9* 2.8*    Recent Labs  10/30/14 1410  LIPASE 8*  AMYLASE 250*   CBC:  Recent Labs  10/31/14 0254 10/31/14 1011  WBC 8.4 9.6  NEUTROABS  --  8.2*  HGB 10.7* 11.3*  HCT 32.3* 33.6*  MCV 84.6 86.6  PLT 172 175   Cardiac Enzymes: No results for input(s): CKTOTAL, CKMB, CKMBINDEX, TROPONINI in the last 72 hours. BNP: Invalid input(s): POCBNP D-Dimer: No results for input(s): DDIMER in the last 72 hours. Hemoglobin  A1C:  Recent Labs  10/29/14 0541  HGBA1C 5.8*   Fasting Lipid Panel:  Recent Labs  10/29/14 0542  CHOL 180  HDL 55  LDLCALC 116*  TRIG 46  CHOLHDL 3.3   Thyroid Function Tests: No results for input(s): TSH, T4TOTAL, T3FREE, THYROIDAB in the last 72 hours.  Invalid input(s): FREET3 Anemia Panel: No results for input(s): VITAMINB12, FOLATE, FERRITIN, TIBC, IRON, RETICCTPCT in the last 72 hours. Coag Panel:   Lab Results  Component Value Date   INR 1.11 10/27/2014   INR 1.49 10/27/2014   INR 1.12 10/27/2014    RADIOLOGY: Ct Head Wo Contrast  10/27/2014   CLINICAL DATA:  Cardiac arrest.  EXAM: CT HEAD WITHOUT CONTRAST  TECHNIQUE: Contiguous axial images were obtained from the base of the skull through the vertex without intravenous contrast.  COMPARISON:  None.  FINDINGS: Bony calvarium appears intact. No mass effect or midline shift is noted. Ventricular size is within normal limits. There is no evidence of mass lesion, hemorrhage or acute infarction.  IMPRESSION: Normal head CT.   Electronically Signed   By: Roque Lias M.D.   On:  10/27/2014 16:04   Dg Chest Port 1 View  10/29/2014   CLINICAL DATA:  Fever.  Smoker.  EXAM: PORTABLE CHEST - 1 VIEW  COMPARISON:  10/29/2014  FINDINGS: Endotracheal tube is been removed. Enteric tube tip is in the left upper quadrant consistent with location in the upper stomach. Shallow inspiration. Cardiac enlargement without significant vascular congestion. Atelectasis or infiltration in the lung bases, greater on the left. No pneumothorax. Similar appearance to previous study.  IMPRESSION: Cardiac enlargement. Shallow inspiration with atelectasis or infiltration in the lung bases, similar to prior study.   Electronically Signed   By: Burman NievesWilliam  Stevens M.D.   On: 10/29/2014 21:05   Dg Chest Port 1 View  10/29/2014   CLINICAL DATA:  68 year old male currently admitted with acute respiratory failure.  EXAM: PORTABLE CHEST - 1 VIEW  COMPARISON:  Prior  chest x-ray 10/27/2014  FINDINGS: Endotracheal tube 7.1 cm above the carina. The tip of the nasogastric tube overlies the upper stomach just beyond the GE junction. Stable borderline cardiomegaly. Developing hazy airspace opacity in the right lower lobe concerning for pneumonia versus aspiration. Small left pleural effusion and associated left basilar atelectasis. Mild pulmonary vascular congestion without overt edema. No pneumothorax. No acute osseous abnormality.  IMPRESSION: 1. Developing hazy opacity in the medial right lower lobe concerning for pneumonia or aspiration. 2. Trace left pleural effusion with associated atelectasis. 3. Stable borderline cardiomegaly. 4. Stable position of endotracheal tube. 5. The tip of the nasogastric tube overlies the upper stomach just beyond the GE junction. Consider advancing several cm.   Electronically Signed   By: Malachy MoanHeath  McCullough M.D.   On: 10/29/2014 07:42   Dg Chest Portable 1 View  10/27/2014   CLINICAL DATA:  Weakness cardiac arrest.  Unresponsive.  EXAM: PORTABLE CHEST - 1 VIEW  COMPARISON:  None.  FINDINGS: Endotracheal tube with tip near the clavicular heads. An orogastric tube reaches the stomach.  There is no edema, consolidation, effusion, or pneumothorax. Thin line left lateral to the descending thoracic aorta favors minimal atelectasis. No convincing pneumomediastinum.  No visible rib fracture.  IMPRESSION: 1. Endotracheal and orogastric tubes are in good position. 2. No cardiomegaly or pulmonary edema.   Electronically Signed   By: Tiburcio PeaJonathan  Watts M.D.   On: 10/27/2014 13:48   Dg Abd Acute W/chest  10/31/2014   CLINICAL DATA:  Lower chest and upper abdominal pain.  EXAM: ACUTE ABDOMEN SERIES (ABDOMEN 2 VIEW & CHEST 1 VIEW)  COMPARISON:  10/29/2014  FINDINGS: The heart is mildly enlarged. There is density at the left lung base partially obscuring the hemidiaphragm and associated with air bronchograms. There has been some improvement in aeration of the  right lung base. No free intraperitoneal air.  Bowel gas pattern is nonobstructive. No evidence for organomegaly. No abnormal calcifications.  IMPRESSION: 1. Cardiomegaly. 2. Left lower lobe infiltrate. 3. Nonobstructive bowel gas pattern.   Electronically Signed   By: Rosalie GumsBeth  Brown M.D.   On: 10/31/2014 07:06   Dg Abd Portable 1v  10/29/2014   CLINICAL DATA:  Nasogastric tube placement  EXAM: PORTABLE ABDOMEN - 1 VIEW  COMPARISON:  No similar prior exam is available at this institution for comparison or on [facility].  FINDINGS: Artifact from multiple support devices is noted. Nasogastric tube tip visualized over the body of the stomach. Side hole is at the GE junction which may increase the risk of reflux, consider advancing 3-5 cm.  IMPRESSION: Tip of NG tube projects over the body of  the stomach. Consider advancing 3-5 cm for placement of the side hole distal to the GE junction.   Electronically Signed   By: Christiana PellantGretchen  Green M.D.   On: 10/29/2014 09:50   Active Problems:   Cardiac arrest   Inferior MI   Protein-calorie malnutrition, severe   Respiratory failure   STEMI (ST elevation myocardial infarction)   Hypoxic ischemic encephalopathy (HIE)   Abdominal pain   Hyperlipidemia   Pre-diabetes   Transaminitis   Hypokalemia   Atrial fibrillation   Cocaine abuse   Chronic combined systolic and diastolic CHF (congestive heart failure)   Acute blood loss anemia    ASSESSMENT: 68-yo homeless man with cocaine abuse found down for 5-10 minutes found to have vfib arrest in setting of inferior STEMI that was resuscitated and underwent emergent cardiac catherization found to have occluded distal RCA, diffuse heavy calcification in the entire RCA with multiple segments of severe stenosis, and severe left ventricular systolic dysfunction EF 20-25%.   PLAN:    Acute Inferior STEMI with Vfib Arrest - Pt is s/p PTCA with balloon angioplasty of the mid and distal RCA. Currently on rectal aspirin 300 mg  daily and lipitor 80 mg daily. Once able to tolerate PO intake, to restart brillinta 90 mg BID. Will add carvedilol 3.25 mg BID. Will need to have viability study (MRI?) to determine if would benefit from PCI of RCA, however risk of stent thrombosis in setting of possible noncompliant patient is high, however he reports he is compliant and can easily obtain medications through medicare.  Atrial Fibrillation - Currently in NSR with normal HR. Currently on IV amiodarone, will transition to PO 400 mg BID for 1 week then daily once able to tolerate PO intake. Will add carvedilol 3.25 mg BID. Currently on aspirin 300 mg daily and to restart brillinta 60 mg BID.   Combined Systolic and Diastolic Dysfunction - EF 20-25% and grade 1 diastolic dysfunction on 2D-echo. Will add carvedilol 3.25 mg BID. Consider eventually starting ACEi (normal renal function).  Hypokalemia - K 3.1 today. S/p IV potassium chloride. Continue to monitor. Possible aspiration pneumonia - Currently on IV zosyn. Normal oxygen saturation on RA. Hyperlipidemia - LDL 116. Continue lipitor 80 mg daily. Pre-diabetes - A1c 5.8. Consider outpatient initiation of metformin.  Transaminititis - AST/ALT elevated most likely in setting of ischemia. Acute hepatitis panel and HIV Ab negative. Acute Blood loss Anemia - Hg stable at 11 with no active bleeding. Continue to monitor s/p catherization. Cocaine Abuse - UDS positive for cocaine. Counsel on cessation. Caution with selective BB.   Pt stable for transfer out of CCU from cardiac standpoint.   Otis BraceABBANI, MARJAN, MD  PGY-II IMTS  Pager [phone] 10/31/2014  10:59 AM  I have seen and evaluated the patient this morning along with Dr. Johna Rolesabbani on morning rounds. We have reviewed the chart and all available data. I agree with her findings, examination and recommendations.  68 year old gentleman with history of cocaine abuse - frail and cachectic, chronically ill-appearing who was found down after  5-10 minutes was noted to be in V. Fib and underwent CPR and with defibrillation. He was brought to the cardiac catheterization lab for inferior STEMI and found a totally occluded distal RCA that was treated with PTCA only due to the extensive calcification in the RCA. These found to have severe LV dysfunction with EF of roughly 20-25%with essentially akinesis of the anteroseptal and inferior wall.  He has been successfully extubated and no longer show  sign of acute heart failure. Currently he is showing signs of having an ileus which makes taking oral medications difficult. He is on rectal suppository aspirin, but his antiplatelet agent is held until able to by mouth again. He has been on low-dose beta blocker, with his cocaine use history, would use low-dose carvedilol given his low EF. At present I would not add additional afterload reduction until he proves stable from a hemodynamic standpoint.  He went into atrial fibrillation over the weekend, and has been on IV amiodarone with chemical cardioversion. At this point we can convert to oral amiodarone to complete the load. Would do 40 mg twice a day for 1 week and 400 mg once a day for week and then 200 mg a day for maintenance.  He is in place on statin.   I reviewed the cardiac cath report and images.  The RCA is a relatively small caliber vessel that is extensively calcified with proximal disease and then severe distal disease at the occlusion site. 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 Performing successful intervention on this vessel would be very difficult, and would require rotational atherectomy and extensive stent placement.  Prior to proceeding with such a complex procedure, it would be best if he were to repeat have time to recover from his infarct, being able to take by mouth was also very important. My major concern are: 1. Is there any  viability in the RCA distribution, if not then PCI is not reasonable in this setting -- rotational atherectomy and patient with an EF of 20-25% would be considered high risk. --> I will discuss with my imaging collies are the best option to evaluate for viability in this distribution prior to considering high-risk revascularization. 2. With extensive rotational atherectomy and stent placement, the patient will need to be consistently on dual platelet therapy. He does have a Medicaid card which should allow him to get his dual independent medications, the question would be his reliability in taking long-term antiplatelet agents. -- re-infarct from acute stent thrombosis would be disastrous.  He claims that he would be reliable, however he is homeless, and I question his ability to obtain medications. We have social worker evaluating the patient.  Otherwise noncardiac issues are being managed by [facility] & soon by [facility].  Marykay Lex, M.D., M.S. Interventional Cardiologist   Pager # [phone]

## 2014-11-01 ENCOUNTER — Inpatient Hospital Stay (HOSPITAL_COMMUNITY): Payer: Medicare Other

## 2014-11-01 DIAGNOSIS — J189 Pneumonia, unspecified organism: Secondary | ICD-10-CM

## 2014-11-01 DIAGNOSIS — I10 Essential (primary) hypertension: Secondary | ICD-10-CM

## 2014-11-01 LAB — COMPREHENSIVE METABOLIC PANEL
ALBUMIN: 2.8 g/dL — AB (ref 3.5–5.2)
ALK PHOS: 64 U/L (ref 39–117)
ALT: 54 U/L — ABNORMAL HIGH (ref 0–53)
ANION GAP: 12 (ref 5–15)
AST: 116 U/L — ABNORMAL HIGH (ref 0–37)
BILIRUBIN TOTAL: 0.7 mg/dL (ref 0.3–1.2)
BUN: 13 mg/dL (ref 6–23)
CHLORIDE: 101 meq/L (ref 96–112)
CO2: 25 mEq/L (ref 19–32)
Calcium: 8.9 mg/dL (ref 8.4–10.5)
Creatinine, Ser: 0.74 mg/dL (ref 0.50–1.35)
GFR calc Af Amer: >90 mL/min (ref >90)
GFR calc non Af Amer: >90 mL/min (ref >90)
Glucose, Bld: 95 mg/dL (ref 70–99)
POTASSIUM: 3.6 meq/L — AB (ref 3.7–5.3)
Sodium: 138 mEq/L (ref 137–147)
Total Protein: 6.1 g/dL (ref 6.0–8.3)

## 2014-11-01 LAB — CULTURE, RESPIRATORY

## 2014-11-01 LAB — CULTURE, RESPIRATORY W GRAM STAIN

## 2014-11-01 LAB — GLUCOSE, CAPILLARY
GLUCOSE-CAPILLARY: 91 mg/dL (ref 70–99)
GLUCOSE-CAPILLARY: 96 mg/dL (ref 70–99)
Glucose-Capillary: 192 mg/dL — ABNORMAL HIGH (ref 70–99)
Glucose-Capillary: 96 mg/dL (ref 70–99)
Glucose-Capillary: 89 mg/dL (ref 70–99)
Glucose-Capillary: 91 mg/dL (ref 70–99)

## 2014-11-01 MED ORDER — PANTOPRAZOLE SODIUM 40 MG PO TBEC
40.0000 mg | DELAYED_RELEASE_TABLET | Freq: Every day | ORAL | Status: DC
  Administered 2014-11-01 – 2014-11-05 (×4): 40 mg via ORAL
  Filled 2014-11-01 (×5): qty 1

## 2014-11-01 MED ORDER — ASPIRIN 81 MG PO CHEW
81.0000 mg | CHEWABLE_TABLET | Freq: Every day | ORAL | Status: DC
  Administered 2014-11-02 – 2014-11-05 (×4): 81 mg via ORAL
  Filled 2014-11-01 (×4): qty 1

## 2014-11-01 MED ORDER — IPRATROPIUM-ALBUTEROL 0.5-2.5 (3) MG/3ML IN SOLN: 3.0000 mL | Freq: Four times a day (QID) | RESPIRATORY_TRACT | Status: DC | PRN

## 2014-11-01 MED ORDER — LORAZEPAM 2 MG/ML IJ SOLN
0.5000 mg | Freq: Once | INTRAMUSCULAR | Status: AC
  Administered 2014-11-01: 0.5 mg via INTRAVENOUS

## 2014-11-01 MED ORDER — ASPIRIN 325 MG PO TABS
325.0000 mg | ORAL_TABLET | Freq: Once | ORAL | Status: AC
  Administered 2014-11-01: 325 mg via ORAL
  Filled 2014-11-01: qty 1

## 2014-11-01 MED ORDER — AMIODARONE HCL 200 MG PO TABS
400.0000 mg | ORAL_TABLET | Freq: Two times a day (BID) | ORAL | Status: DC
  Administered 2014-11-01 (×2): 400 mg via ORAL
  Filled 2014-11-01 (×4): qty 2

## 2014-11-01 MED ORDER — CARVEDILOL 6.25 MG PO TABS
6.2500 mg | ORAL_TABLET | Freq: Two times a day (BID) | ORAL | Status: DC
  Administered 2014-11-01 – 2014-11-05 (×7): 6.25 mg via ORAL
  Filled 2014-11-01 (×10): qty 1

## 2014-11-01 MED ORDER — LORAZEPAM 2 MG/ML IJ SOLN
2.0000 mg | Freq: Once | INTRAMUSCULAR | Status: AC
  Administered 2014-11-01: 2 mg via INTRAVENOUS
  Filled 2014-11-01: qty 1

## 2014-11-01 NOTE — Evaluation (Signed)
Occupational Therapy Evaluation Patient Details Name: [name] MRN: [id number] DOB: 23-Aug-1946 Today's Date: 11/01/2014    History of Present Illness This 68 y.o. male admitted 10/27/14 after witnessed v-fib arrest in a homeless shelter.  Pt was down 5-10 mins prior to initiaion of CPR and debrilation - STEMI.  Pt extubated 10/29/14.  PMH:  a-Fib; cocaine abuse; chronic combined systolic and diastolic CHF; CAD   Clinical Impression   Pt admitted with above. He demonstrates the below listed deficits and will benefit from continued OT to maximize safety and independence with BADLs.  Pt presents to OT with impaired cognition and impaired balance.  Currently, he requires mod A with BADLs and mod A +2 for funcitonal mobility.  He will require 24 hour assist at discharge.  If CIR is an option, feel he would benefit.  Otherwise, he will likely need SNF       Follow Up Recommendations  Supervision/Assistance - 24 hour;CIR    Equipment Recommendations  3 in 1 bedside comode    Recommendations for Other Services Rehab consult     Precautions / Restrictions Precautions Precautions: Fall Precaution Comments: confused      Mobility Bed Mobility                  Transfers Overall transfer level: Needs assistance Equipment used: 2 person hand held assist Transfers: Sit to/from BJ'sStand;Stand Pivot Transfers Sit to Stand: Mod assist;+2 physical assistance Stand pivot transfers: Mod assist;+2 physical assistance       General transfer comment: Pt with poor balance.  Pt with scissoring gait, Lt. knee buckling and forgets to advance feet at times     Balance Overall balance assessment: Needs assistance Sitting-balance support: Feet unsupported Sitting balance-Leahy Scale: Fair     Standing balance support: During functional activity Standing balance-Leahy Scale: Poor Standing balance comment: Requires mod A while brushing teeth.  Leans Rt and posteriorly                              ADL Overall ADL's : Needs assistance/impaired Eating/Feeding: Supervision/ safety;Sitting;Set up   Grooming: Wash/dry hands;Wash/dry face;Oral care;Moderate assistance;Standing Grooming Details (indicate cue type and reason): Pt requires assist with set up.  Leans heavily to the Rt and occasionally posteriorly  Upper Body Bathing: Moderate assistance;Sitting Upper Body Bathing Details (indicate cue type and reason): cues/assist for thoroughness Lower Body Bathing: Moderate assistance;Sit to/from stand Lower Body Bathing Details (indicate cue type and reason): cues/assist for thoroughness Upper Body Dressing : Moderate assistance;Sitting Upper Body Dressing Details (indicate cue type and reason): cues to maintain attention  Lower Body Dressing: Sit to/from stand;Maximal assistance   Toilet Transfer: Moderate assistance;+2 for physical assistance;Ambulation;Comfort height toilet   Toileting- Clothing Manipulation and Hygiene: Total assistance;Sit to/from stand       Functional mobility during ADLs: Moderate assistance;+2 for physical assistance       Vision                 Additional Comments: Pt unable to perform visual assessment at this time due to cognitive deficits    Perception     Praxis      Pertinent Vitals/Pain Pain Assessment: No/denies pain     Hand Dominance     Extremity/Trunk Assessment Upper Extremity Assessment Upper Extremity Assessment: Generalized weakness   Lower Extremity Assessment Lower Extremity Assessment: Defer to PT evaluation   Cervical / Trunk Assessment Cervical / Trunk Assessment: Normal  Communication Communication Communication: Other (comment) (slurred speech )   Cognition Arousal/Alertness: Awake/alert Behavior During Therapy: Restless Overall Cognitive Status: Impaired/Different from baseline Area of Impairment: Orientation;Attention;Memory;Following commands;Safety/judgement;Awareness;Problem  solving Orientation Level: Disoriented to;Place;Time;Situation Current Attention Level: Sustained Memory: Decreased short-term memory Following Commands: Follows one step commands consistently (with cues) Safety/Judgement: Decreased awareness of safety;Decreased awareness of deficits Awareness: Intellectual Problem Solving: Slow processing;Decreased initiation;Difficulty sequencing;Requires verbal cues;Requires tactile cues General Comments: Pt is internally distarcted.  He requires mod verbal cues to stay on task.  He is very restless.  Confused    General Comments       Exercises       Shoulder Instructions      Home Living Family/patient expects to be discharged to:: Private residence Living Arrangements: Non-relatives/Friends Available Help at Discharge: Friend(s) Type of Home: House Home Access: Stairs to enter Secretary/administratorntrance Stairs-Number of Steps: 3 Entrance Stairs-Rails: None Home Layout: One level               Home Equipment: None          Prior Functioning/Environment Level of Independence: Independent             OT Diagnosis: Generalized weakness;Cognitive deficits   OT Problem List: Decreased strength;Decreased activity tolerance;Impaired balance (sitting and/or standing);Decreased cognition;Decreased safety awareness;Decreased knowledge of use of DME or AE   OT Treatment/Interventions: Self-care/ADL training;DME and/or AE instruction;Neuromuscular education;Therapeutic activities;Cognitive remediation/compensation;Patient/family education;Balance training    OT Goals(Current goals can be found in the care plan section) Acute Rehab OT Goals Patient Stated Goal: none stated OT Goal Formulation: Patient unable to participate in goal setting Time For Goal Achievement: 11/15/14 Potential to Achieve Goals: Good ADL Goals Pt Will Perform Grooming: with min assist;standing Pt Will Perform Upper Body Bathing: with supervision;sitting Pt Will Perform Lower  Body Bathing: with min assist;sit to/from stand Pt Will Perform Upper Body Dressing: with supervision;sitting Pt Will Perform Lower Body Dressing: with min assist;sit to/from stand Pt Will Transfer to Toilet: with min assist;ambulating;regular height toilet;bedside commode;grab bars Pt Will Perform Toileting - Clothing Manipulation and hygiene: with min assist;sit to/from stand Additional ADL Goal #1: Pt will be will maintain attention x 8 mins during BADLs with min verbal cues   OT Frequency: Min 2X/week   Barriers to D/C: Decreased caregiver support          Co-evaluation              End of Session Equipment Utilized During Treatment: Gait belt Nurse Communication: Mobility status  Activity Tolerance: Patient tolerated treatment well Patient left: in chair;with call bell/phone within reach;with chair alarm set;with nursing/sitter in room;with family/visitor present   Time: [id number] OT Time Calculation (min): 38 min Charges:  OT General Charges $OT Visit: 1 Procedure OT Evaluation $Initial OT Evaluation Tier I: 1 Procedure OT Treatments $Self Care/Home Management : 8-22 mins $Therapeutic Activity: 8-22 mins G-Codes:    Terilynn Buresh M 11/01/2014, 2:16 PM

## 2014-11-01 NOTE — Progress Notes (Signed)
Patient Name: [name] Date of Encounter: 11/01/2014     Principal Problem:   Ventricular fibrillation - 2/2 Inferior STEMI Active Problems:   Inferior MI   Protein-calorie malnutrition, severe   Respiratory failure   STEMI (ST elevation myocardial infarction)   Hypoxic ischemic encephalopathy (HIE)   Abdominal pain   Hyperlipidemia with target LDL less than 70   Pre-diabetes   Transaminitis   Hypokalemia   Atrial fibrillation   Cocaine abuse   Chronic combined systolic and diastolic CHF (congestive heart failure)   Acute blood loss anemia   CAD S/P percutaneous coronary angioplasty - PTCA only of dRCA 100% for STEMI, heavily calcified vessel with extensive disease   Cardiac arrest   Patient Profile:  [name] is a 68-yo male with PMH of cocaine abuse found down for 5-10 minutes found to have vfib arrest in setting of inferior STEMI that was resuscitated and underwent emergent cardiac catherization on 10/27/2014 (occuluded distal RCA). Echo on 10/28/2014 showed EF 20-25% with Grade 1 Diastolic Dysfunction.   SUBJECTIVE Denies any palpitations, or shortness of breath. Complains intermittent CP. Was sitting up on the side of his bed and was able to carry on a conversation, however mumbles a lot. His main focus while talking was how hungry he was.  CURRENT MEDS . antiseptic oral rinse  7 mL Mouth Rinse q12n4p  . aspirin  300 mg Rectal Daily  . atorvastatin  80 mg Oral q1800  . carvedilol  3.125 mg Oral BID WC  . chlorhexidine  15 mL Mouth Rinse BID  . enoxaparin (LOVENOX) injection  40 mg Subcutaneous Q24H  . ipratropium-albuterol  3 mL Nebulization Q6H  . LORazepam  0.5 mg Intravenous BID  . pantoprazole (PROTONIX) IV  40 mg Intravenous QHS  . piperacillin-tazobactam (ZOSYN)  IV  3.375 g Intravenous 3 times per day    OBJECTIVE  Filed Vitals:   10/31/14 1956 10/31/14 2000 10/31/14 2100 10/31/14 2259  BP:  139/81  143/74  Pulse:    73  Temp:  98.5 F (36.9  C)  99.1 F (37.3 C)  TempSrc:  Oral  Oral  Resp:  18 19 20   Height:      Weight:    149 lb 14.4 oz (67.994 kg)  SpO2: 98%   92%    Intake/Output Summary (Last 24 hours) at 11/01/14 0832 Last data filed at 10/31/14 2100  Gross per 24 hour  Intake 1602.1 ml  Output    301 ml  Net 1301.1 ml   Filed Weights   10/30/14 0500 10/31/14 0500 10/31/14 2259  Weight: 149 lb 7.6 oz (67.8 kg) 145 lb 15.1 oz (66.2 kg) 149 lb 14.4 oz (67.994 kg)    PHYSICAL EXAM  General: Pleasant, NAD. Neuro: Alert and oriented X 2. Moves all extremities spontaneously. Psych: Normal affect. HEENT:  Normal  Neck: Supple without bruits. JVD not elevated. Lungs:  Resp regular and unlabored, CTA without rales or wheezing. Heart: RRR no s3, s4, or murmurs. Abdomen: Soft, non-tender, non-distended, BS + x 4.  Extremities: No clubbing, cyanosis or edema. DP/PT/Radials 2+ and equal bilaterally.  Accessory Clinical Findings  CBC  Recent Labs  10/31/14 0254 10/31/14 1011  WBC 8.4 9.6  NEUTROABS  --  8.2*  HGB 10.7* 11.3*  HCT 32.3* 33.6*  MCV 84.6 86.6  PLT 172 175   Basic Metabolic Panel  Recent Labs  10/30/14 0350 10/31/14 0254 11/01/14 0417  NA 140 138 138  K 4.2 3.1*  3.6*  CL 101 98 101  CO2 24 27 25   GLUCOSE 94 158* 95  BUN 12 14 13   CREATININE 0.64 0.79 0.74  CALCIUM 9.5 8.9 8.9  MG 1.6  --   --    Liver Function Tests  Recent Labs  10/31/14 0254 11/01/14 0417  AST 161* 116*  ALT 58* 54*  ALKPHOS 73 64  BILITOT 0.5 0.7  PROT 6.0 6.1  ALBUMIN 2.8* 2.8*    Recent Labs  10/30/14 1410  LIPASE 8*  AMYLASE 250*    TELE Normal Sinus Rhythm. No Atrial Fibrillation noted over past 24 hours.    ECG: No new ECG in last 24 hours.  Echocardiogram: 10/28/2014  Study Conclusions  - Left ventricle: The cavity size was normal. Wall thickness was normal. Systolic function was severely reduced. The estimated ejection fraction was in the range of 20% to 25%. Akinesis of  the basal-midinferior myocardium. Akinesis of the basal-midanteroseptal myocardium. Doppler parameters are consistent with abnormal left ventricular relaxation (grade 1 diastolic dysfunction). - Mitral valve: There was mild regurgitation.    Radiology/Studies  Ct Head Wo Contrast - 10/27/2014   CLINICAL DATA:  Cardiac arrest.  EXAM: CT HEAD WITHOUT CONTRAST  TECHNIQUE: Contiguous axial images were obtained from the base of the skull through the vertex without intravenous contrast.  COMPARISON:  None.  FINDINGS: Bony calvarium appears intact. No mass effect or midline shift is noted. Ventricular size is within normal limits. There is no evidence of mass lesion, hemorrhage or acute infarction.  IMPRESSION: Normal head CT.   Electronically Signed   By: Roque Lias M.D.   On: 10/27/2014 16:04   US Abdomen Complete - 10/31/2014   CLINICAL DATA:  Abdominal pain.  Diffuse abdominal pain.  EXAM: ULTRASOUND ABDOMEN COMPLETE  COMPARISON:  Radiographs 10/31/2014  FINDINGS: Gallbladder: Large volume of sludge layers dependently within the lumen of the gallbladder. There is no gallbladder wall thickening or pericholecystic fluid. Negative sonographic Murphy's sign.  Common bile duct: Diameter: Upper limits of normal at 6 mm.  Liver: No focal lesion identified. Within normal limits in parenchymal echogenicity.  IVC: No abnormality visualized.  Pancreas: Not identified  Spleen: Size and appearance within normal limits.  Right Kidney: Length: 10.9 cm. Echogenicity within normal limits. No mass or hydronephrosis visualized.  Left Kidney: Length: 11.5 cm. Echogenicity within normal limits. No mass or hydronephrosis visualized.  Abdominal aorta: No aneurysm visualized.  Other findings: No free fluid.  IMPRESSION: 1. Gallbladder sludge without evidence of acute cholecystitis. 2. Common bile duct upper limits of normal.   Electronically Signed   By: Genevive Bi M.D.   On: 10/31/2014 14:36   Dg Chest Port 1 View  - 10/29/2014   CLINICAL DATA:  Fever.  Smoker.  EXAM: PORTABLE CHEST - 1 VIEW  COMPARISON:  10/29/2014  FINDINGS: Endotracheal tube is been removed. Enteric tube tip is in the left upper quadrant consistent with location in the upper stomach. Shallow inspiration. Cardiac enlargement without significant vascular congestion. Atelectasis or infiltration in the lung bases, greater on the left. No pneumothorax. Similar appearance to previous study.  IMPRESSION: Cardiac enlargement. Shallow inspiration with atelectasis or infiltration in the lung bases, similar to prior study.   Electronically Signed   By: Burman Nieves M.D.   On: 10/29/2014 21:05   Dg Chest Port 1 View - 10/29/2014   CLINICAL DATA:  68 year old male currently admitted with acute respiratory failure.  EXAM: PORTABLE CHEST - 1 VIEW  COMPARISON:  Prior chest x-ray 10/27/2014  FINDINGS: Endotracheal tube 7.1 cm above the carina. The tip of the nasogastric tube overlies the upper stomach just beyond the GE junction. Stable borderline cardiomegaly. Developing hazy airspace opacity in the right lower lobe concerning for pneumonia versus aspiration. Small left pleural effusion and associated left basilar atelectasis. Mild pulmonary vascular congestion without overt edema. No pneumothorax. No acute osseous abnormality.  IMPRESSION: 1. Developing hazy opacity in the medial right lower lobe concerning for pneumonia or aspiration. 2. Trace left pleural effusion with associated atelectasis. 3. Stable borderline cardiomegaly. 4. Stable position of endotracheal tube. 5. The tip of the nasogastric tube overlies the upper stomach just beyond the GE junction. Consider advancing several cm.   Electronically Signed   By: Malachy MoanHeath  McCullough M.D.   On: 10/29/2014 07:42   Dg Abd Acute W/chest - 10/31/2014   CLINICAL DATA:  Lower chest and upper abdominal pain.  EXAM: ACUTE ABDOMEN SERIES (ABDOMEN 2 VIEW & CHEST 1 VIEW)  COMPARISON:  10/29/2014  FINDINGS: The heart is mildly  enlarged. There is density at the left lung base partially obscuring the hemidiaphragm and associated with air bronchograms. There has been some improvement in aeration of the right lung base. No free intraperitoneal air.  Bowel gas pattern is nonobstructive. No evidence for organomegaly. No abnormal calcifications.  IMPRESSION: 1. Cardiomegaly. 2. Left lower lobe infiltrate. 3. Nonobstructive bowel gas pattern.   Electronically Signed   By: Rosalie GumsBeth  Brown M.D.   On: 10/31/2014 07:06     ASSESSMENT AND PLAN 1. V-fib arrest 2/2 #2  2. S/P STEMI - s/p balloon angioplasty of RCA - Cath report recommendations were: Would consider PCI of the RCA with rotablator atherectomy, however, the downside to this would be the need for a very long segment of overlapping stents in a patient who at this time may not have the ability to remain compliant with medical therapy. This would be high risk for stent thrombosis.  - Continue Brillinta, ASA, lipitor - previously on 300 mg suppository ASA, received 325mg  ASA this morning. Will add 81mg  daily ASA - Continue Coreg, will increase dose to 6.25 mg BID. Continue uptitrate. Add ACEI/ARB if BP tolerates - biggest question remain whether we need to pursue high risk PCI of RCA, not ideal candidate with homelessness and compliance issue. Will discuss with Dr. Eden EmmsNishan, if pursue PCI, will potentially need viability test prior to cath  3. HTN - increase coreg  4. Severe systolic LV dysfunction  - Afterload reduction with Lisinopril once hemodynamically stable.  5. Paroxysmal a-fib with RVR  - Per Dr. Herbie BaltimoreHarding, will convert IV Amiodarone to Oral Amiodarone and he recommends doing 400 mg twice a day for 1 week and 400 mg once a day for week and then 200 mg a day for maintenance.  6. Possible aspiration pneumonia - management per critical care, on Zosyn.   Ramond DialSigned, Meng, Hao PA-C Pager: [phone]  Seen with Wandra MannanBrittany Manning, PA-Student. Seen together with Azalee CourseHao Meng PA-C.  Treatment plan discussed  Patient examned chart reviewed  Discussed case with Dr Clifton JamesMcAlhany  Given homelessness and diffuse disease would not Rx proximal and mid RCA.  Medical Rx  Agree with amiodarone for PAF in setting of CAD

## 2014-11-01 NOTE — Progress Notes (Signed)
PT Cancellation Note  Patient Details Name: Scott MinceJames Zacher MRN: [id number] DOB: Feb 25, 1946   Cancelled Treatment:    Reason Eval/Treat Not Completed: Other (comment) (RN requests PT be held at this time) RN reports pt has been very agitated and confused. Requests PT be held until a later time. Will follow up as time allows.   [address]Log[address] Secor ForestonBarbour, [place] CarolinaPT [phone]   [place]erton Mount[place]bour, Jone Panebianco S 11/01/2014, 3:01 PM

## 2014-11-01 NOTE — Progress Notes (Addendum)
PROGRESS NOTE    [name] WGN:[id number] DOB: [birth year] DOA: 10/27/2014 PCP: No primary care provider on file.  HPI/Brief narrative 68 year old male with history of cocaine abuse, found down for 5-10 minutes and in Vfib arrest in setting of inferior STEMI. He was resuscitated and underwent emergent cardiac catheterization on 10/27/14 (occluded distal RCA). Echo on 10/28/14 showed EF 20-25 percent with grade 1 diastolic dysfunction. Patient was in ICU under PCCM care and was transferred to floor on 11/01/14 under cardiology service with [facility] providing consultation.   Assessment/Plan:  1. VDRF post VF arrest: extubated and stable. 2. Early HCAP Vs Aspiration PNA (strep): continue IV Zosyn 3. VF arrest/inf wall STEMI/RCA stenosis (could not be stented)/severe dilated CM/PAF: management per primary service i.e.cardiology 4. Hypertension: Controlled. 5. Hypernatremia: Resolved 6. Hypokalemia: replace as needed and follow BMP. 7. Gastric ileus: Resolved and tolerating diet per nursing. 8. Normocytic anemia: Stable. 9. Post anoxic acute encephalopathy: seems confused. Monitor. 10. S/p Fall 11/10: patient attempted to get off the chair and apparently tripped and sustained a fall. No reported injuries. Fall precautions and monitor. 11. Abnormal LFTs/transaminitis: Improving. ? Shock liver. Ultrasound abdomen shows gallbladder sludge without evidence of acute cholecystitis.   Code Status: Full  Family Communication: Discussed with daughter at bedside. Disposition Plan: To be determined   SIGNIFICANT EVENTS/STUDIES: 11/05 Vfib arrest with 5- 15 minutes down time. DCCV X 4 reported 11/05 LHC: Inferior STEMI secondary to occluded distal RCA. Diffuse heavy calcification in the entire RCA with multiple segments of severe stenosis. Severe LV systolic dysfunction 11/05 CT head: NAD 11/06 TTE: LVEF 20-25%. AK of basal-midinferior myocardium. Grade 1 DD 11/06 EEG: mod-severe generalized slowing  without epileptiform discharges 11/07 Passed SBT. Very agitated and not F/C on WUA. Extubated and no distress post extubation. dexmedetomidine gtt ordered 11/08 Has tolerated extubation. Cognition much improved. Abdominal exam abnormal/firm with diminished BS. High NGT output. Amylase/lipase/LFTs ordered 11/8 amio for fib 11/9 - high output from NGT  Consultants:  PCCM  Cardiology  Procedures: ETT 11/05 >> 11/07 L radial A line 11/05 >> 11/08 R femoral venous sheath 11/05 >> 11/07  Antibiotics: Ceftriaxone 11/07 >> 11/08 Pip-tazo 11/08 >>   Subjective: Pleasantly confused- denies complaints. As per daughter, patient did not sleep well last night and went to bed early this morning.  Objective: Filed Vitals:   10/31/14 2100 10/31/14 2259 11/01/14 1033 11/01/14 1352  BP:  143/74 137/82 131/72  Pulse:  73 66 65  Temp:  99.1 F (37.3 C) 97.8 F (36.6 C) 98.9 F (37.2 C)  TempSrc:  Oral Oral Oral  Resp: 19 20 20 20   Height:      Weight:  67.994 kg (149 lb 14.4 oz)    SpO2:  92% 98% 100%    Intake/Output Summary (Last 24 hours) at 11/01/14 1729 Last data filed at 11/01/14 1407  Gross per 24 hour  Intake 1092.25 ml  Output      0 ml  Net 1092.25 ml   Filed Weights   10/30/14 0500 10/31/14 0500 10/31/14 2259  Weight: 67.8 kg (149 lb 7.6 oz) 66.2 kg (145 lb 15.1 oz) 67.994 kg (149 lb 14.4 oz)     Exam:  General exam: chronically ill-looking middle-age male lying comfortably in bed. Respiratory system: reduced breath sounds in the bases. No increased work of breathing. Cardiovascular system: S1 & S2 heard, RRR. No JVD, murmurs, gallops, clicks or pedal edema. Tele: SR. Gastrointestinal system: Abdomen is nondistended, soft and nontender. Normal bowel  sounds heard. Central nervous system: Alert and oriented only to self. No focal neurological deficits. Extremities: Symmetric 5 x 5 power.   Data Reviewed: Basic Metabolic Panel:  Recent Labs Lab 10/29/14 0200  10/29/14 0541 10/30/14 0350 10/31/14 0254 11/01/14 0417  NA 138 137 140 138 138  K 3.9 4.5 4.2 3.1* 3.6*  CL 101 100 101 98 101  CO2 26 24 24 27 25   GLUCOSE 110* 99 94 158* 95  BUN 10 10 12 14 13   CREATININE 0.59 0.79 0.64 0.79 0.74  CALCIUM 8.9 8.9 9.5 8.9 8.9  MG  --   --  1.6  --   --    Liver Function Tests:  Recent Labs Lab 10/27/14 1320 10/29/14 0541 10/30/14 1410 10/31/14 0254 11/01/14 0417  AST 147* 163* 170* 161* 116*  ALT 108* 85* 67* 58* 54*  ALKPHOS 103 92 79 73 64  BILITOT 1.1 0.5 0.3 0.5 0.7  PROT 7.1 6.8 6.4 6.0 6.1  ALBUMIN 3.7 3.2* 2.9* 2.8* 2.8*    Recent Labs Lab 10/30/14 1410  LIPASE 8*  AMYLASE 250*   No results for input(s): AMMONIA in the last 168 hours. CBC:  Recent Labs Lab 10/28/14 0600 10/29/14 0541 10/30/14 0350 10/31/14 0254 10/31/14 1011  WBC 6.3 9.1 14.2* 8.4 9.6  NEUTROABS  --   --   --   --  8.2*  HGB 13.8 13.1 12.6* 10.7* 11.3*  HCT 39.9 39.0 37.2* 32.3* 33.6*  MCV 84.2 85.2 84.9 84.6 86.6  PLT 194 207 200 172 175   Cardiac Enzymes:  Recent Labs Lab 10/27/14 1611 10/27/14 2145 10/28/14 0406  TROPONINI 2.30* 2.15* 1.53*   BNP (last 3 results) No results for input(s): PROBNP in the last 8760 hours. CBG:  Recent Labs Lab 11/01/14 0138 11/01/14 0539 11/01/14 0810 11/01/14 1127 11/01/14 1623  GLUCAP 96 91 89 96 91    Recent Results (from the past 240 hour(s))  Urine culture     Status: None   Collection Time: 10/27/14  4:35 PM  Result Value Ref Range Status   Specimen Description URINE, RANDOM  Final   Special Requests Normal  Final   Culture  Setup Time   Final    10/27/2014 21:50 Performed at [facility]    Colony Count NO GROWTH Performed at [facility]   Final   Culture NO GROWTH Performed at [facility]   Final   Report Status 10/28/2014 FINAL  Final  MRSA PCR Screening     Status: None   Collection Time: 10/27/14  4:35 PM  Result Value Ref Range Status   MRSA  by PCR NEGATIVE NEGATIVE Final    Comment:        The GeneXpert MRSA Assay (FDA approved for NASAL specimens only), is one component of a comprehensive MRSA colonization surveillance program. It is not intended to diagnose MRSA infection nor to guide or monitor treatment for MRSA infections.   Culture, blood (routine x 2)     Status: None (Preliminary result)   Collection Time: 10/27/14  7:30 PM  Result Value Ref Range Status   Specimen Description BLOOD LEFT ARM  Final   Special Requests BOTTLES DRAWN AEROBIC AND ANAEROBIC 5 CC  Final   Culture  Setup Time   Final    10/28/2014 01:00 Performed at [facility]    Culture   Final           BLOOD CULTURE RECEIVED NO GROWTH TO  DATE CULTURE WILL BE HELD FOR 5 DAYS BEFORE ISSUING A FINAL NEGATIVE REPORT Performed at [facility]    Report Status PENDING  Incomplete  Culture, respiratory (NON-Expectorated)     Status: None   Collection Time: 10/29/14  9:15 AM  Result Value Ref Range Status   Specimen Description TRACHEAL ASPIRATE  Final   Special Requests NONE  Final   Gram Stain   Final    ABUNDANT WBC PRESENT, PREDOMINANTLY PMN NO SQUAMOUS EPITHELIAL CELLS SEEN MODERATE GRAM POSITIVE COCCI IN PAIRS IN CHAINS IN CLUSTERS Performed at [facility]    Culture   Final    ABUNDANT STREPTOCOCCUS PNEUMONIAE Performed at [facility]    Report Status 11/01/2014 FINAL  Final   Organism ID, Bacteria STREPTOCOCCUS PNEUMONIAE  Final      Susceptibility   Streptococcus pneumoniae - MIC (ETEST)*    CEFTRIAXONE 0.38 SENSITIVE Sensitive     LEVOFLOXACIN 0.75 SENSITIVE Sensitive     PENICILLIN 0.75 INTERMEDIATE Intermediate     * ABUNDANT STREPTOCOCCUS PNEUMONIAE  Culture, Urine     Status: None   Collection Time: 10/29/14  8:16 PM  Result Value Ref Range Status   Specimen Description URINE, CATHETERIZED  Final   Special Requests NONE  Final   Culture  Setup Time   Final    10/29/2014  21:29 Performed at [facility]    Colony Count   Final    50,000 COLONIES/ML Performed at [facility]    Culture   Final    Multiple bacterial morphotypes present, none predominant. Suggest appropriate recollection if clinically indicated. Performed at [facility]    Report Status 10/31/2014 FINAL  Final  Culture, blood (routine x 2)     Status: None (Preliminary result)   Collection Time: 10/29/14  9:09 PM  Result Value Ref Range Status   Specimen Description BLOOD LEFT ANTECUBITAL  Final   Special Requests BOTTLES DRAWN AEROBIC ONLY 5CC  Final   Culture  Setup Time   Final    10/30/2014 00:58 Performed at [facility]    Culture   Final           BLOOD CULTURE RECEIVED NO GROWTH TO DATE CULTURE WILL BE HELD FOR 5 DAYS BEFORE ISSUING A FINAL NEGATIVE REPORT Performed at [facility]    Report Status PENDING  Incomplete  Culture, blood (routine x 2)     Status: None (Preliminary result)   Collection Time: 10/29/14  9:25 PM  Result Value Ref Range Status   Specimen Description BLOOD LEFT ANTECUBITAL  Final   Special Requests BOTTLES DRAWN AEROBIC ONLY 1CC  Final   Culture  Setup Time   Final    10/30/2014 00:58 Performed at [facility]    Culture   Final           BLOOD CULTURE RECEIVED NO GROWTH TO DATE CULTURE WILL BE HELD FOR 5 DAYS BEFORE ISSUING A FINAL NEGATIVE REPORT Performed at [facility]    Report Status PENDING  Incomplete        Studies: [place] Abdomen Complete  10/31/2014   CLINICAL DATA:  Abdominal pain.  Diffuse abdominal pain.  EXAM: ULTRASOUND ABDOMEN COMPLETE  COMPARISON:  Radiographs 10/31/2014  FINDINGS: Gallbladder: Large volume of sludge layers dependently within the lumen of the gallbladder. There is no gallbladder wall thickening or pericholecystic fluid. Negative sonographic Murphy's sign.  Common bile duct: Diameter: Upper limits of normal at 6  mm.  Liver: No focal lesion identified.  Within normal limits in parenchymal echogenicity.  IVC: No abnormality visualized.  Pancreas: Not identified  Spleen: Size and appearance within normal limits.  Right Kidney: Length: 10.9 cm. Echogenicity within normal limits. No mass or hydronephrosis visualized.  Left Kidney: Length: 11.5 cm. Echogenicity within normal limits. No mass or hydronephrosis visualized.  Abdominal aorta: No aneurysm visualized.  Other findings: No free fluid.  IMPRESSION: 1. Gallbladder sludge without evidence of acute cholecystitis. 2. Common bile duct upper limits of normal.   Electronically Signed   By: Genevive Bi M.D.   On: 10/31/2014 14:36   Dg Chest Port 1 View  11/01/2014   CLINICAL DATA:  Pulmonary edema  EXAM: PORTABLE CHEST - 1 VIEW  COMPARISON:  Chest radiograph 10/29/2014 and acute abdominal series 11 08/2014  FINDINGS: Cardiomegaly appears similar to recent prior studies. There is cephalization above the pulmonary vascularity. There is diffuse interstitial prominence bilaterally with mild perihilar and right basilar airspace disease. No visible pleural effusion on these portable AP views of the chest. Negative for pneumothorax. Thin linear sclerotic area associated with the anterior lateral left sixth rib is unchanged dating back to 10/27/2014 and could reflect remote posttraumatic changes.  IMPRESSION: Mild pulmonary edema pattern.   Electronically Signed   By: Britta Mccreedy M.D.   On: 11/01/2014 14:00   Dg Abd Acute W/chest  10/31/2014   CLINICAL DATA:  Lower chest and upper abdominal pain.  EXAM: ACUTE ABDOMEN SERIES (ABDOMEN 2 VIEW & CHEST 1 VIEW)  COMPARISON:  10/29/2014  FINDINGS: The heart is mildly enlarged. There is density at the left lung base partially obscuring the hemidiaphragm and associated with air bronchograms. There has been some improvement in aeration of the right lung base. No free intraperitoneal air.  Bowel gas pattern is nonobstructive. No evidence for organomegaly. No abnormal  calcifications.  IMPRESSION: 1. Cardiomegaly. 2. Left lower lobe infiltrate. 3. Nonobstructive bowel gas pattern.   Electronically Signed   By: Rosalie Gums M.D.   On: 10/31/2014 07:06        Scheduled Meds: . amiodarone  400 mg Oral BID  . antiseptic oral rinse  7 mL Mouth Rinse q12n4p  . [START ON 11/02/2014] aspirin  81 mg Oral Daily  . atorvastatin  80 mg Oral q1800  . carvedilol  6.25 mg Oral BID WC  . chlorhexidine  15 mL Mouth Rinse BID  . enoxaparin (LOVENOX) injection  40 mg Subcutaneous Q24H  . LORazepam  0.5 mg Intravenous BID  . pantoprazole  40 mg Oral Q1200  . piperacillin-tazobactam (ZOSYN)  IV  3.375 g Intravenous 3 times per day   Continuous Infusions: . sodium chloride 5 mL/hr at 10/31/14 2000    Principal Problem:   Ventricular fibrillation - 2/2 Inferior STEMI Active Problems:   Inferior MI   Protein-calorie malnutrition, severe   Respiratory failure   STEMI (ST elevation myocardial infarction)   Hypoxic ischemic encephalopathy (HIE)   Abdominal pain   Hyperlipidemia with target LDL less than 70   Pre-diabetes   Transaminitis   Hypokalemia   Atrial fibrillation   Cocaine abuse   Chronic combined systolic and diastolic CHF (congestive heart failure)   Acute blood loss anemia   CAD S/P percutaneous coronary angioplasty - PTCA only of dRCA 100% for STEMI, heavily calcified vessel with extensive disease   Cardiac arrest    Time spent: 30 minutes.    Marcellus Scott, MD, FACP, FHM. [facility] Pager [phone]  If 7PM-7AM, please contact night-coverage [email or web address] Password [email or web address] 11/01/2014, 5:29 PM    LOS: 5 days

## 2014-11-01 NOTE — Clinical Social Work Placement (Addendum)
    Clinical Social Work Department CLINICAL SOCIAL WORK PLACEMENT NOTE 11/01/2014  Patient:  [name],Scott Helmers  Account Number:  [id number] Admit date:  10/27/2014  Clinical Social Worker:  Lupita LeashNNA CROWDER, LCSW  Date/time:  11/01/2014 05:55 PM  Clinical Social Work is seeking post-discharge placement for this patient at the following level of care:   SKILLED NURSING   (*CSW will update this form in Epic as items are completed)   11/01/2014  Patient/family provided with [name] Department of Clinical Social Work's list of facilities offering this level of care within the geographic area requested by the patient (or if unable, by the patient's family).  11/01/2014  Patient/family informed of their freedom to choose among providers that offer the needed level of care, that participate in Medicare, Medicaid or managed care program needed by the patient, have an available bed and are willing to accept the patient.  11/01/2014  Patient/family informed of [name]' ownership interest in [name], as well as of the fact that they are under no obligation to receive care at this facility.  PASARR submitted to EDS on 11/01/2014 PASARR number received on 11/01/2014  FL2 transmitted to all facilities in geographic area requested by pt/family on  11/01/2014 FL2 transmitted to all facilities within larger geographic area on 11/03/14 [name]([name], MSW, [place], 11/04/14)  Patient informed that his/her managed care company has contracts with or will negotiate with  certain facilities, including the following:   NA     Patient/family informed of bed offers received:  11/04/14 [name] (Windell MouldingEric Anterhaus, MSW, [place], 11/04/14) Physician recommends and patient chooses bed at    [facility] be [name] on  11/05/2014 [name]([name], [name]) Patient to be transferred to facility by Ambulance  [name]([name]) Patient  and family notified of transfer on  11/05/2014 [name]([name], [name]) Name of family member notified:  Pt's son, [name], updated regarding discharge. [name]([name], [name])  The following physician request were entered in Epic: Physician Request  Please sign FL2.  Please prepare priority discharge summary and prescriptions.    Additional Comments:   [name], MSW, [name] [phone] 11/04/2014 5:58 PM  Marcelline DeistEmily Josef Bryant, [name] [phone]) Licensed Clinical Social Worker Orthopedics [phone]([id number]) and Surgical [phone]([id number])

## 2014-11-01 NOTE — Psychosocial Assessment (Signed)
Clinical Social Work Department BRIEF PSYCHOSOCIAL ASSESSMENT 11/01/2014  Patient:  [name],[name]     Account Number:  [id number]     Admit date:  10/27/2014  Clinical Social Worker:  Eleanor Gatliff, CLINICAL SOCIAL WORKER  Date/Time:  11/01/2014 11:46 AM  Referred by:  Physician  Date Referred:  11/01/2014 Referred for  Psychosocial assessment   Other Referral:   Interview type:  Family Other interview type:   SW talked to patient's [name]. SW will call the son [name] to update about situation and follow up with PT when he is awake.    PSYCHOSOCIAL DATA Living Status:  ALONE Admitted from facility:   Level of care:   Primary support name:  [name]  [name]- sib Primary support relationship to patient:  CHILD, ADULT Degree of support available:   SW talked with pt's Bryant at bedside while pt was asleep. SW talked to Bryant about the recommendation for SNF. Bryant is very supportive. Per Bryant's request- SW will call son [name]  to update about the recommendation for SNF. [name]'s number is [phone]. [name]'s number is [phone]. SNF bed list provided to Bryant; Fl2 placed on chart for MD's signature.    CURRENT CONCERNS Current Concerns  Post-Acute Placement   Other Concerns:    SOCIAL WORK ASSESSMENT / PLAN SW talked to the [name] about the recommendation for SNF.Patient lives alone and she states that he goes to the "homeless shelter" for his meals .   Pt was asleep during the visit  and Bryant reports that her father fell a little while ago from his chair. A safety sitter will be placed in room.  SW will come back to talk with patient about the recommendation for SNF. Bryant was agreeable to send out bed search. SW will call son to talk with the recomendation for SNF. FL2 on chart for MD signature.   Assessment/plan status:  Psychosocial Support/Ongoing Assessment of Needs Other assessment/ plan:   Information/referral to  community resources:   SW gave Bryant SNF list. SW will send out bed search and follow up with PT and family.    PATIENT'S/FAMILY'S RESPONSE TO PLAN OF CARE: Bryant was very supportive and agreeable for bed search. Pt was asleep and resting during the visit. Per nursing- patient is alert and oriented to person and some to place. Disoriented to time and situation.  He will require family support for decision making. [name], BSW Intern, [phone]

## 2014-11-01 NOTE — Progress Notes (Signed)
In to give pt evening meds. Pt became impulsive, verbally abusive and rude. The sitter (April) and I changed bed and electrodes and pt accused us of playing with him. We explained that he had spilled the urinal on his bed and we needed to change it.

## 2014-11-01 NOTE — Progress Notes (Signed)
Pt fell this am, confused. Attempted to work with OT later with difficulties. Will hold ambulation today. Ethelda ChickKristan Lukah Goswami CES, ACSM 3:12 PM 11/01/2014

## 2014-11-01 NOTE — Clinical Social Work Psychosocial (Signed)
     Clinical Social Work Department BRIEF PSYCHOSOCIAL ASSESSMENT 11/01/2014  Patient:  [name],Pepe     Account Number:  [id number]     Admit date:  10/27/2014  Clinical Social Worker:  DOLLARHITE,SAMANTHA, CLINICAL SOCIAL WORKER  Date/Time:  11/01/2014 11:46 AM  Referred by:  Physician  Date Referred:  11/01/2014 Referred for  Psychosocial assessment   Other Referral:   Interview type:  Family Other interview type:   SW talked to patient's daughter Deanna ArtisKeisha. SW will call the son [name] to update about situation and follow up with PT when he is awake.    PSYCHOSOCIAL DATA Living Status:  ALONE Admitted from facility:   Level of care:   Primary support name:  Glean HessKeisha Harrelson- Daughter  Jannifer HickJeff Staley- sib Primary support relationship to patient:  CHILD, ADULT Degree of support available:   SW talked with pt's daughter at bedside while pt was asleep. SW talked to daughter about the recommendation for SNF. Daughter is very supportive. Per daughter's request- SW will call son [name]  to update about the recommendation for SNF. [name]'s number is [phone]. [name]'s number is [phone]. SNF bed list provided to daughter; Fl2 placed on chart for MD's signature.    CURRENT CONCERNS Current Concerns  Post-Acute Placement   Other Concerns:    SOCIAL WORK ASSESSMENT / PLAN SW talked to the daughter Deanna ArtisKeisha about the recommendation for SNF.Patient lives alone and she states that he goes to the "homeless shelter" for his meals .   Pt was asleep during the visit  and daughter reports that her father fell a little while ago from his chair. A safety sitter will be placed in room.  SW will come back to talk with patient about the recommendation for SNF. Daughter was agreeable to send out bed search. SW will call son to talk with the recomendation for SNF. FL2 on chart for MD signature.   Assessment/plan status:  Psychosocial Support/Ongoing Assessment of Needs Other assessment/ plan:    Information/referral to community resources:   SW gave daughter SNF list. SW will send out bed search and follow up with PT and family.    PATIENTS/FAMILYS RESPONSE TO PLAN OF CARE: Daughter was very supportive and agreeable for bed search. Pt was asleep and resting during the visit. Per nursing- patient is alert and oriented to person and some to place. Disoriented to time and situation.  He will require family support for decision making. [employer] MartinSamantha Dollarhite, BSW Intern, [phone]

## 2014-11-01 NOTE — Progress Notes (Signed)
Patient with sitter at bedside appears very agitated wanting to leave to get his "stamp card and pay some bills". Daughter at bedside appears to agiated patient more. CN, Director and other staffs are at bedside calming the patient. AM ativan held this AM r/t sedation. Administered now per agitation. He is now resting quietly in bed.Sitter at bedside. Will continue.  Sim BoastHavy, RN

## 2014-11-01 NOTE — Progress Notes (Addendum)
Post progression approximately at 1010am, writer rounded on patient with no distress noted, daughter at bedside appears sleep. Chair alarm check and it's activated. Less than 5 minutes, writer responded to room that patient had fallen on the floor with foot left foot caught in between chair. Another RN noted bed alarm was activated  Prior to the fall and asked patient to sit back in bed. The staff that found patient on the floor noted that alarm wasn't on upon incident. No injury noted. Patient denied any pain. Dr. Waymon AmatoHongalgi aware. No order received at this time. VS stable. Patient assisted back to bed. Will continue to monitor.  Sim BoastHavy, RN

## 2014-11-02 DIAGNOSIS — F1414 Cocaine abuse with cocaine-induced mood disorder: Secondary | ICD-10-CM

## 2014-11-02 LAB — COMPREHENSIVE METABOLIC PANEL
ALK PHOS: 63 U/L (ref 39–117)
ALT: 46 U/L (ref 0–53)
ANION GAP: 12 (ref 5–15)
AST: 74 U/L — ABNORMAL HIGH (ref 0–37)
Albumin: 2.8 g/dL — ABNORMAL LOW (ref 3.5–5.2)
BILIRUBIN TOTAL: 0.7 mg/dL (ref 0.3–1.2)
BUN: 13 mg/dL (ref 6–23)
CHLORIDE: 103 meq/L (ref 96–112)
CO2: 27 mEq/L (ref 19–32)
Calcium: 9 mg/dL (ref 8.4–10.5)
Creatinine, Ser: 0.78 mg/dL (ref 0.50–1.35)
GFR calc Af Amer: >90 mL/min (ref >90)
GFR calc non Af Amer: >90 mL/min (ref >90)
Glucose, Bld: 117 mg/dL — ABNORMAL HIGH (ref 70–99)
POTASSIUM: 3 meq/L — AB (ref 3.7–5.3)
SODIUM: 142 meq/L (ref 137–147)
TOTAL PROTEIN: 6 g/dL (ref 6.0–8.3)

## 2014-11-02 LAB — GLUCOSE, CAPILLARY
Glucose-Capillary: 129 mg/dL — ABNORMAL HIGH (ref 70–99)
Glucose-Capillary: 130 mg/dL — ABNORMAL HIGH (ref 70–99)

## 2014-11-02 LAB — MAGNESIUM: Magnesium: 2 mg/dL (ref 1.5–2.5)

## 2014-11-02 LAB — CLOSTRIDIUM DIFFICILE BY PCR: CDIFFPCR: NEGATIVE

## 2014-11-02 MED ORDER — POTASSIUM CHLORIDE CRYS ER 20 MEQ PO TBCR
40.0000 meq | EXTENDED_RELEASE_TABLET | Freq: Two times a day (BID) | ORAL | Status: AC
  Administered 2014-11-02 (×2): 40 meq via ORAL
  Filled 2014-11-02 (×2): qty 2

## 2014-11-02 MED ORDER — AMIODARONE HCL 200 MG PO TABS
400.0000 mg | ORAL_TABLET | Freq: Two times a day (BID) | ORAL | Status: DC
  Administered 2014-11-02 – 2014-11-05 (×7): 400 mg via ORAL
  Filled 2014-11-02 (×8): qty 2

## 2014-11-02 MED ORDER — AMIODARONE HCL 200 MG PO TABS: 200.0000 mg | ORAL_TABLET | Freq: Every day | ORAL | Status: DC

## 2014-11-02 MED ORDER — DIAZEPAM 5 MG/ML IJ SOLN
INTRAMUSCULAR | Status: AC
  Filled 2014-11-02: qty 2

## 2014-11-02 MED ORDER — AMIODARONE HCL 200 MG PO TABS: 400.0000 mg | ORAL_TABLET | Freq: Every day | ORAL | Status: DC

## 2014-11-02 MED ORDER — CEFTRIAXONE SODIUM IN DEXTROSE 20 MG/ML IV SOLN
1.0000 g | INTRAVENOUS | Status: DC
  Administered 2014-11-02 – 2014-11-03 (×2): 1 g via INTRAVENOUS
  Filled 2014-11-02 (×3): qty 50

## 2014-11-02 MED ORDER — MAGNESIUM SULFATE 2 GM/50ML IV SOLN
2.0000 g | Freq: Once | INTRAVENOUS | Status: AC
  Administered 2014-11-02: 2 g via INTRAVENOUS
  Filled 2014-11-02: qty 50

## 2014-11-02 MED ORDER — LORAZEPAM 1 MG PO TABS
1.0000 mg | ORAL_TABLET | Freq: Once | ORAL | Status: AC
  Administered 2014-11-02: 1 mg via ORAL
  Filled 2014-11-02: qty 1

## 2014-11-02 MED ORDER — DIAZEPAM 5 MG/ML IJ SOLN
2.0000 mg | INTRAMUSCULAR | Status: DC | PRN
  Administered 2014-11-02 – 2014-11-03 (×2): 2 mg via INTRAVENOUS
  Filled 2014-11-02 (×2): qty 2

## 2014-11-02 MED ORDER — AMIODARONE HCL 200 MG PO TABS: 400.0000 mg | ORAL_TABLET | Freq: Two times a day (BID) | ORAL | Status: DC

## 2014-11-02 MED ORDER — LORAZEPAM 0.5 MG PO TABS
0.5000 mg | ORAL_TABLET | Freq: Two times a day (BID) | ORAL | Status: DC
  Administered 2014-11-03 – 2014-11-04 (×3): 0.5 mg via ORAL
  Filled 2014-11-02 (×3): qty 1

## 2014-11-02 MED ORDER — LORAZEPAM 2 MG/ML IJ SOLN: 1.0000 mg | Freq: Once | INTRAMUSCULAR | Status: DC

## 2014-11-02 NOTE — Progress Notes (Signed)
Patient has become combative and trying to bite out of his restraints. MD on call made aware and orders received for valium 2 mg IV Q4H. Bradley FerrisBrandie Erla Bacchi RN BSN 11/02/2014 5:23 PM

## 2014-11-02 NOTE — Progress Notes (Signed)
Patient: [name] / Admit Date: 10/27/2014 / Date of Encounter: 11/02/2014, 8:41 AM   Subjective: Denies chest pain. However, there are about 5 security guards in the room trying to get him to calm down. He apparently has been trying to throw urine on staff and feces in the room. Per notes last night, the patient accused the nurses of playing with him when they were cleaning up the bed because he spilled the urinal on it. He is agitated. His son tells me this is unfortunately what he is like on a regular basis.  Objective: Telemetry: NSR Physical Exam: Blood pressure 124/74, pulse 67, temperature 97.4 F (36.3 C), temperature source Oral, resp. rate 18, height 6\' 1"  (1.854 m), weight 142 lb 9.6 oz (64.683 kg), SpO2 100 %. General: Well developed thin AAM in no acute distress. Head: Normocephalic, atraumatic, sclera non-icteric, no xanthomas, nares are without discharge. Neck: JVP not elevated. Lungs: Clear bilaterally to auscultation without wheezes, rales, or rhonchi. Breathing is unlabored. Heart: RRR S1 S2 without murmurs, rubs, or gallops.  Abdomen: Soft, non-tender, non-distended.  Extremities: No clubbing or cyanosis. No edema. Distal pedal pulses are 2+ and equal bilaterally. Neuro: Currently combative and requiring restraints but was cooperative with respiratory and heart exam   Intake/Output Summary (Last 24 hours) at 11/02/14 0841 Last data filed at 11/01/14 1407  Gross per 24 hour  Intake 705.45 ml  Output      0 ml  Net 705.45 ml    Inpatient Medications:  . [START ON 11/08/2014] amiodarone  400 mg Oral Daily   Followed by  . amiodarone  400 mg Oral BID   Followed by  . [START ON 11/15/2014] amiodarone  200 mg Oral Daily  . antiseptic oral rinse  7 mL Mouth Rinse q12n4p  . aspirin  81 mg Oral Daily  . atorvastatin  80 mg Oral q1800  . carvedilol  6.25 mg Oral BID WC  . chlorhexidine  15 mL Mouth Rinse BID  . enoxaparin (LOVENOX) injection  40 mg Subcutaneous  Q24H  . LORazepam  0.5 mg Intravenous BID  . LORazepam  1 mg Intravenous Once  . magnesium sulfate 1 - 4 g bolus IVPB  2 g Intravenous Once  . pantoprazole  40 mg Oral Q1200  . piperacillin-tazobactam (ZOSYN)  IV  3.375 g Intravenous 3 times per day  . potassium chloride  40 mEq Oral BID   Infusions:  . sodium chloride 5 mL/hr at 10/31/14 2000    Labs:  Recent Labs  11/01/14 0417 11/02/14 0400  NA 138 142  K 3.6* 3.0*  CL 101 103  CO2 25 27  GLUCOSE 95 117*  BUN 13 13  CREATININE 0.74 0.78  CALCIUM 8.9 9.0    Recent Labs  11/01/14 0417 11/02/14 0400  AST 116* 74*  ALT 54* 46  ALKPHOS 64 63  BILITOT 0.7 0.7  PROT 6.1 6.0  ALBUMIN 2.8* 2.8*    Recent Labs  10/31/14 0254 10/31/14 1011  WBC 8.4 9.6  NEUTROABS  --  8.2*  HGB 10.7* 11.3*  HCT 32.3* 33.6*  MCV 84.6 86.6  PLT 172 175   No results for input(s): CKTOTAL, CKMB, TROPONINI in the last 72 hours. Invalid input(s): POCBNP No results for input(s): HGBA1C in the last 72 hours.   Radiology/Studies:  Ct Head Wo Contrast  10/27/2014   CLINICAL DATA:  Cardiac arrest.  EXAM: CT HEAD WITHOUT CONTRAST  TECHNIQUE: Contiguous axial images were obtained from the base  of the skull through the vertex without intravenous contrast.  COMPARISON:  None.  FINDINGS: Bony calvarium appears intact. No mass effect or midline shift is noted. Ventricular size is within normal limits. There is no evidence of mass lesion, hemorrhage or acute infarction.  IMPRESSION: Normal head CT.   Electronically Signed   By: Roque Lias M.D.   On: 10/27/2014 16:04   US Abdomen Complete  10/31/2014   CLINICAL DATA:  Abdominal pain.  Diffuse abdominal pain.  EXAM: ULTRASOUND ABDOMEN COMPLETE  COMPARISON:  Radiographs 10/31/2014  FINDINGS: Gallbladder: Large volume of sludge layers dependently within the lumen of the gallbladder. There is no gallbladder wall thickening or pericholecystic fluid. Negative sonographic Murphy's sign.  Common bile duct:  Diameter: Upper limits of normal at 6 mm.  Liver: No focal lesion identified. Within normal limits in parenchymal echogenicity.  IVC: No abnormality visualized.  Pancreas: Not identified  Spleen: Size and appearance within normal limits.  Right Kidney: Length: 10.9 cm. Echogenicity within normal limits. No mass or hydronephrosis visualized.  Left Kidney: Length: 11.5 cm. Echogenicity within normal limits. No mass or hydronephrosis visualized.  Abdominal aorta: No aneurysm visualized.  Other findings: No free fluid.  IMPRESSION: 1. Gallbladder sludge without evidence of acute cholecystitis. 2. Common bile duct upper limits of normal.   Electronically Signed   By: Genevive Bi M.D.   On: 10/31/2014 14:36   Dg Chest Port 1 View  11/01/2014   CLINICAL DATA:  Pulmonary edema  EXAM: PORTABLE CHEST - 1 VIEW  COMPARISON:  Chest radiograph 10/29/2014 and acute abdominal series 11 08/2014  FINDINGS: Cardiomegaly appears similar to recent prior studies. There is cephalization above the pulmonary vascularity. There is diffuse interstitial prominence bilaterally with mild perihilar and right basilar airspace disease. No visible pleural effusion on these portable AP views of the chest. Negative for pneumothorax. Thin linear sclerotic area associated with the anterior lateral left sixth rib is unchanged dating back to 10/27/2014 and could reflect remote posttraumatic changes.  IMPRESSION: Mild pulmonary edema pattern.   Electronically Signed   By: Britta Mccreedy M.D.   On: 11/01/2014 14:00   Dg Chest Port 1 View  10/29/2014   CLINICAL DATA:  Fever.  Smoker.  EXAM: PORTABLE CHEST - 1 VIEW  COMPARISON:  10/29/2014  FINDINGS: Endotracheal tube is been removed. Enteric tube tip is in the left upper quadrant consistent with location in the upper stomach. Shallow inspiration. Cardiac enlargement without significant vascular congestion. Atelectasis or infiltration in the lung bases, greater on the left. No pneumothorax. Similar  appearance to previous study.  IMPRESSION: Cardiac enlargement. Shallow inspiration with atelectasis or infiltration in the lung bases, similar to prior study.   Electronically Signed   By: Burman Nieves M.D.   On: 10/29/2014 21:05   Dg Chest Port 1 View  10/29/2014   CLINICAL DATA:  68 year old male currently admitted with acute respiratory failure.  EXAM: PORTABLE CHEST - 1 VIEW  COMPARISON:  Prior chest x-ray 10/27/2014  FINDINGS: Endotracheal tube 7.1 cm above the carina. The tip of the nasogastric tube overlies the upper stomach just beyond the GE junction. Stable borderline cardiomegaly. Developing hazy airspace opacity in the right lower lobe concerning for pneumonia versus aspiration. Small left pleural effusion and associated left basilar atelectasis. Mild pulmonary vascular congestion without overt edema. No pneumothorax. No acute osseous abnormality.  IMPRESSION: 1. Developing hazy opacity in the medial right lower lobe concerning for pneumonia or aspiration. 2. Trace left pleural effusion with associated  atelectasis. 3. Stable borderline cardiomegaly. 4. Stable position of endotracheal tube. 5. The tip of the nasogastric tube overlies the upper stomach just beyond the GE junction. Consider advancing several cm.   Electronically Signed   By: Malachy MoanHeath  McCullough M.D.   On: 10/29/2014 07:42   Dg Chest Portable 1 View  10/27/2014   CLINICAL DATA:  Weakness cardiac arrest.  Unresponsive.  EXAM: PORTABLE CHEST - 1 VIEW  COMPARISON:  None.  FINDINGS: Endotracheal tube with tip near the clavicular heads. An orogastric tube reaches the stomach.  There is no edema, consolidation, effusion, or pneumothorax. Thin line left lateral to the descending thoracic aorta favors minimal atelectasis. No convincing pneumomediastinum.  No visible rib fracture.  IMPRESSION: 1. Endotracheal and orogastric tubes are in good position. 2. No cardiomegaly or pulmonary edema.   Electronically Signed   By: Tiburcio PeaJonathan  Watts M.D.    On: 10/27/2014 13:48   Dg Abd Acute W/chest  10/31/2014   CLINICAL DATA:  Lower chest and upper abdominal pain.  EXAM: ACUTE ABDOMEN SERIES (ABDOMEN 2 VIEW & CHEST 1 VIEW)  COMPARISON:  10/29/2014  FINDINGS: The heart is mildly enlarged. There is density at the left lung base partially obscuring the hemidiaphragm and associated with air bronchograms. There has been some improvement in aeration of the right lung base. No free intraperitoneal air.  Bowel gas pattern is nonobstructive. No evidence for organomegaly. No abnormal calcifications.  IMPRESSION: 1. Cardiomegaly. 2. Left lower lobe infiltrate. 3. Nonobstructive bowel gas pattern.   Electronically Signed   By: Rosalie GumsBeth  Brown M.D.   On: 10/31/2014 07:06   Dg Abd Portable 1v  10/29/2014   CLINICAL DATA:  Nasogastric tube placement  EXAM: PORTABLE ABDOMEN - 1 VIEW  COMPARISON:  No similar prior exam is available at this institution for comparison or on [facility] PACS.  FINDINGS: Artifact from multiple support devices is noted. Nasogastric tube tip visualized over the body of the stomach. Side hole is at the GE junction which may increase the risk of reflux, consider advancing 3-5 cm.  IMPRESSION: Tip of NG tube projects over the body of the stomach. Consider advancing 3-5 cm for placement of the side hole distal to the GE junction.   Electronically Signed   By: Christiana PellantGretchen  Green M.D.   On: 10/29/2014 09:50     Assessment and Plan  1. VF arrest in the setting of inferior STEMI/CAD - s/p PTCA with balloon angioplasty only of mid and distal RCA. Dr. Eden EmmsNishan d/w Dr. Clifton JamesMcAlhany yesterday - given homelessness and diffuse disease would not rx proximal and mid RCA - medical therapy - continue Brilinta, aspirin, lipitor, Coreg (will need to abstain from cocaine) - with his anoxic encephalopathy, agitation/noncompliance, and poor social situation he is unlikely to be a candidate for lifevest 2. 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 VDRF post arrest with early HCAP vs aspiration PNA (strep) - appreciate IM assistance 4. Cocaine abuse, homelessness, and agitation/inappropriate behavior  - I have passed along request for psych consult 5. Post-anoxic encephalopathy - improved but son states agitation is baseline 6. Paroxysmal atrial fibrillation - per Dr. Herbie BaltimoreHarding, 400mg  BID x 1 week then 400mg  daily for 1 week then 200mg  daily (I adjusted order in Epic to automatically dose this way) - it does not appear there are current plans for anticoag - will ask for official MD comment 7. HTN 8. Lyte abnormalities of hypernatremia, hypokalemia -  internal medicine is repleting  9. Gastric ileus 10. S/p fall 11/10 - patient attempted to get off chair, tripped and fell without reported injuries 11. Abnormal LFTs, possibly d/t shock liver, improving 12. Severe protein calorie malnutrition 13. Hyperlipidemia 14. Pre-diabetes 15. ABL anemia   Not sure which comes first - CIR vs SNF. Will d/w MD.  Signed, Ronie Spies PA-C  More sedate in restraints with sitter  Start lisinopril 2.5 mg daily He is not a candidate for AICD despite Ischemic DCM  Psych consult has been called by Rosann Auerbach  Transfer to Hospitalist service if possible For placement and behavioral issues.  Cardiac status stable  Charlton Haws

## 2014-11-02 NOTE — Progress Notes (Signed)
PT Cancellation Note  Patient Details Name: [name] MRN: [id number] DOB: 1946-11-04   Cancelled Treatment:    Reason Eval/Treat Not Completed: Other (comment), pt had been very agitated this morning, security needed to get him back in bed. Checked back on him after he had received Ativan and at that time he had fallen asleep and was peaceful. Therefore, hold PT for now. Will check back as time allows.   Donnie Panik, [place] 11/02/2014, 11:10 AM

## 2014-11-02 NOTE — Progress Notes (Signed)
1018 Holding ambulation due to pt being confused. Will continue to follow pt . Luetta Nuttingharlene Derrick Orris RN BSN 11/02/2014 10:16 AM

## 2014-11-02 NOTE — Consult Note (Signed)
[facility] Face-to-Face Psychiatry Consult   Reason for Consult:  Cocaine abuse Referring Physician:  Dr. Coletta Memos [name] is an 68 y.o. male. Total Time spent with patient: 45 minutes  Assessment: AXIS I:  Substance Induced Mood Disorder and Cocaine abuse AXIS II:  Deferred AXIS III:   Past Medical History  Diagnosis Date  . Smoker    AXIS IV:  economic problems, housing problems, occupational problems, other psychosocial or environmental problems, problems related to social environment and problems with primary support group AXIS V:  51-60 moderate symptoms  Plan:  patient has no capacity to make his own medical decisions and living arragnement. Refer to social services regarding [facility] needs  Subjective:   [name] is a 68 y.o. male patient admitted with Vfib and STEMI.  HPI:  Patient seen and chart reviewed. Patient appeared in his room with restraints to his upper extremities due to increased confusion, agitation and paranoid delusions. Patient is a poor historian, but awake, alert and poorly oriented. Patient daughter who is at bed side stated that he needs placement because of his current mental state and feels won;t be able to care for him at home.   Medical history:  68 year old male with history of cocaine abuse, found down for 5-10 minutes and in Vfib arrest in setting of inferior STEMI. He was resuscitated and underwent emergent cardiac catheterization on 10/27/14 (occluded distal RCA). Echo on 10/28/14 showed EF 20-25 percent with grade 1 diastolic dysfunction. Patient was in ICU under PCCM care and was transferred to floor on 11/01/14 under cardiology service with [facility] providing consultation. Patient urin drug screen is positive of cocaine and benzo's.   HPI Elements:  Location:  confusion and substance abuse. Quality:  poor'. Severity:  acute. Timing:  unknown.  Past Psychiatric History: Past Medical History  Diagnosis Date  . Smoker     reports that he has been  smoking Cigarettes.  He has been smoking about 0.00 packs per day. He does not have any smokeless tobacco history on file. His alcohol and drug histories are not on file. No family history on file.   Living Arrangements: Non-relatives/Friends     Allergies:  No Known Allergies  ACT Assessment Complete:  NO Objective: Blood pressure 124/74, pulse 67, temperature 97.4 F (36.3 C), temperature source Oral, resp. rate 18, height _0  (1.854 m), weight 64.683 kg (142 lb 9.6 oz), SpO2 100 %.Body mass index is 18.82 kg/(m^2). Results for orders placed or performed during the hospital encounter of 10/27/14 (from the past 72 hour(s))  Glucose, capillary     Status: Abnormal   Collection Time: 10/30/14 11:48 AM  Result Value Ref Range   Glucose-Capillary 103 (H) 70 - 99 mg/dL  Amylase     Status: Abnormal   Collection Time: 10/30/14  2:10 PM  Result Value Ref Range   Amylase 250 (H) 0 - 105 U/L  Lipase, blood     Status: Abnormal   Collection Time: 10/30/14  2:10 PM  Result Value Ref Range   Lipase 8 (L) 11 - 59 U/L  Hepatic function panel     Status: Abnormal   Collection Time: 10/30/14  2:10 PM  Result Value Ref Range   Total Protein 6.4 6.0 - 8.3 g/dL   Albumin 2.9 (L) 3.5 - 5.2 g/dL   AST 170 (H) 0 - 37 U/L   ALT 67 (H) 0 - 53 U/L   Alkaline Phosphatase 79 39 - 117 U/L   Total Bilirubin 0.3  0.3 - 1.2 mg/dL   Bilirubin, Direct <0.2 0.0 - 0.3 mg/dL   Indirect Bilirubin NOT CALCULATED 0.3 - 0.9 mg/dL  Glucose, capillary     Status: Abnormal   Collection Time: 10/30/14  3:54 PM  Result Value Ref Range   Glucose-Capillary 100 (H) 70 - 99 mg/dL   Comment 1 Capillary Sample   Glucose, capillary     Status: None   Collection Time: 10/30/14  8:30 PM  Result Value Ref Range   Glucose-Capillary 99 70 - 99 mg/dL   Comment 1 Capillary Sample   Glucose, capillary     Status: Abnormal   Collection Time: 10/31/14 12:17 AM  Result Value Ref Range   Glucose-Capillary 115 (H) 70 - 99 mg/dL    Comment 1 Capillary Sample   Comprehensive metabolic panel     Status: Abnormal   Collection Time: 10/31/14  2:54 AM  Result Value Ref Range   Sodium 138 137 - 147 mEq/L   Potassium 3.1 (L) 3.7 - 5.3 mEq/L    Comment: DELTA CHECK NOTED   Chloride 98 96 - 112 mEq/L   CO2 27 19 - 32 mEq/L   Glucose, Bld 158 (H) 70 - 99 mg/dL   BUN 14 6 - 23 mg/dL   Creatinine, Ser 0.79 0.50 - 1.35 mg/dL   Calcium 8.9 8.4 - 10.5 mg/dL   Total Protein 6.0 6.0 - 8.3 g/dL   Albumin 2.8 (L) 3.5 - 5.2 g/dL   AST 161 (H) 0 - 37 U/L   ALT 58 (H) 0 - 53 U/L   Alkaline Phosphatase 73 39 - 117 U/L   Total Bilirubin 0.5 0.3 - 1.2 mg/dL   GFR calc non Af Amer >90 >90 mL/min   GFR calc Af Amer >90 >90 mL/min    Comment: (NOTE) The eGFR has been calculated using the CKD EPI equation. This calculation has not been validated in all clinical situations. eGFR's persistently <90 mL/min signify possible Chronic Kidney Disease.    Anion gap 13 5 - 15  CBC     Status: Abnormal   Collection Time: 10/31/14  2:54 AM  Result Value Ref Range   WBC 8.4 4.0 - 10.5 K/uL   RBC 3.82 (L) 4.22 - 5.81 MIL/uL   Hemoglobin 10.7 (L) 13.0 - 17.0 g/dL   HCT 32.3 (L) 39.0 - 52.0 %   MCV 84.6 78.0 - 100.0 fL   MCH 28.0 26.0 - 34.0 pg   MCHC 33.1 30.0 - 36.0 g/dL   RDW 14.0 11.5 - 15.5 %   Platelets 172 150 - 400 K/uL  Procalcitonin     Status: None   Collection Time: 10/31/14  2:54 AM  Result Value Ref Range   Procalcitonin 2.31 ng/mL    Comment:        Interpretation: PCT > 2 ng/mL: Systemic infection (sepsis) is likely, unless other causes are known. (NOTE)         ICU PCT Algorithm               Non ICU PCT Algorithm    ----------------------------     ------------------------------         PCT < 0.25 ng/mL                 PCT < 0.1 ng/mL     Stopping of antibiotics            Stopping of antibiotics       strongly encouraged.  strongly encouraged.    ----------------------------      ------------------------------       PCT level decrease by               PCT < 0.25 ng/mL       >= 80% from peak PCT       OR PCT 0.25 - 0.5 ng/mL          Stopping of antibiotics                                             encouraged.     Stopping of antibiotics           encouraged.    ----------------------------     ------------------------------       PCT level decrease by              PCT >= 0.25 ng/mL       < 80% from peak PCT        AND PCT >= 0.5 ng/mL            Continuing antibiotics                                               encouraged.       Continuing antibiotics            encouraged.    ----------------------------     ------------------------------     PCT level increase compared          PCT > 0.5 ng/mL         with peak PCT AND          PCT >= 0.5 ng/mL             Escalation of antibiotics                                          strongly encouraged.      Escalation of antibiotics        strongly encouraged.   Glucose, capillary     Status: None   Collection Time: 10/31/14  4:21 AM  Result Value Ref Range   Glucose-Capillary 99 70 - 99 mg/dL   Comment 1 Capillary Sample   Glucose, capillary     Status: Abnormal   Collection Time: 10/31/14  7:32 AM  Result Value Ref Range   Glucose-Capillary 117 (H) 70 - 99 mg/dL   Comment 1 Capillary Sample   Lactic acid, plasma     Status: None   Collection Time: 10/31/14 10:05 AM  Result Value Ref Range   Lactic Acid, Venous 1.1 0.5 - 2.2 mmol/L  CBC with Differential     Status: Abnormal   Collection Time: 10/31/14 10:11 AM  Result Value Ref Range   WBC 9.6 4.0 - 10.5 K/uL   RBC 3.88 (L) 4.22 - 5.81 MIL/uL   Hemoglobin 11.3 (L) 13.0 - 17.0 g/dL   HCT 33.6 (L) 39.0 - 52.0 %   MCV 86.6 78.0 - 100.0 fL   MCH 29.1 26.0 - 34.0 pg   MCHC 33.6 30.0 - 36.0 g/dL   RDW 14.1  11.5 - 15.5 %   Platelets 175 150 - 400 K/uL   Neutrophils Relative % 86 (H) 43 - 77 %   Neutro Abs 8.2 (H) 1.7 - 7.7 K/uL   Lymphocytes Relative  7 (L) 12 - 46 %   Lymphs Abs 0.7 0.7 - 4.0 K/uL   Monocytes Relative 7 3 - 12 %   Monocytes Absolute 0.7 0.1 - 1.0 K/uL   Eosinophils Relative 0 0 - 5 %   Eosinophils Absolute 0.0 0.0 - 0.7 K/uL   Basophils Relative 0 0 - 1 %   Basophils Absolute 0.0 0.0 - 0.1 K/uL  Lactate dehydrogenase     Status: Abnormal   Collection Time: 10/31/14 10:11 AM  Result Value Ref Range   LDH 413 (H) 94 - 250 U/L  Glucose, capillary     Status: Abnormal   Collection Time: 10/31/14 12:06 PM  Result Value Ref Range   Glucose-Capillary 105 (H) 70 - 99 mg/dL  Glucose, capillary     Status: Abnormal   Collection Time: 10/31/14  4:43 PM  Result Value Ref Range   Glucose-Capillary 101 (H) 70 - 99 mg/dL  Glucose, capillary     Status: Abnormal   Collection Time: 10/31/14  9:51 PM  Result Value Ref Range   Glucose-Capillary 192 (H) 70 - 99 mg/dL   Comment 1 Capillary Sample   Glucose, capillary     Status: None   Collection Time: 11/01/14  1:38 AM  Result Value Ref Range   Glucose-Capillary 96 70 - 99 mg/dL  Comprehensive metabolic panel     Status: Abnormal   Collection Time: 11/01/14  4:17 AM  Result Value Ref Range   Sodium 138 137 - 147 mEq/L   Potassium 3.6 (L) 3.7 - 5.3 mEq/L   Chloride 101 96 - 112 mEq/L   CO2 25 19 - 32 mEq/L   Glucose, Bld 95 70 - 99 mg/dL   BUN 13 6 - 23 mg/dL   Creatinine, Ser 0.74 0.50 - 1.35 mg/dL   Calcium 8.9 8.4 - 10.5 mg/dL   Total Protein 6.1 6.0 - 8.3 g/dL   Albumin 2.8 (L) 3.5 - 5.2 g/dL   AST 116 (H) 0 - 37 U/L   ALT 54 (H) 0 - 53 U/L   Alkaline Phosphatase 64 39 - 117 U/L   Total Bilirubin 0.7 0.3 - 1.2 mg/dL   GFR calc non Af Amer >90 >90 mL/min   GFR calc Af Amer >90 >90 mL/min    Comment: (NOTE) The eGFR has been calculated using the CKD EPI equation. This calculation has not been validated in all clinical situations. eGFR's persistently <90 mL/min signify possible Chronic Kidney Disease.    Anion gap 12 5 - 15  Glucose, capillary     Status: None    Collection Time: 11/01/14  5:39 AM  Result Value Ref Range   Glucose-Capillary 91 70 - 99 mg/dL  Glucose, capillary     Status: None   Collection Time: 11/01/14  8:10 AM  Result Value Ref Range   Glucose-Capillary 89 70 - 99 mg/dL  Glucose, capillary     Status: None   Collection Time: 11/01/14 11:27 AM  Result Value Ref Range   Glucose-Capillary 96 70 - 99 mg/dL  Glucose, capillary     Status: None   Collection Time: 11/01/14  4:23 PM  Result Value Ref Range   Glucose-Capillary 91 70 - 99 mg/dL  Glucose, capillary     Status:  Abnormal   Collection Time: 11/02/14  2:52 AM  Result Value Ref Range   Glucose-Capillary 129 (H) 70 - 99 mg/dL  Comprehensive metabolic panel     Status: Abnormal   Collection Time: 11/02/14  4:00 AM  Result Value Ref Range   Sodium 142 137 - 147 mEq/L   Potassium 3.0 (L) 3.7 - 5.3 mEq/L   Chloride 103 96 - 112 mEq/L   CO2 27 19 - 32 mEq/L   Glucose, Bld 117 (H) 70 - 99 mg/dL   BUN 13 6 - 23 mg/dL   Creatinine, Ser 0.78 0.50 - 1.35 mg/dL   Calcium 9.0 8.4 - 10.5 mg/dL   Total Protein 6.0 6.0 - 8.3 g/dL   Albumin 2.8 (L) 3.5 - 5.2 g/dL   AST 74 (H) 0 - 37 U/L   ALT 46 0 - 53 U/L   Alkaline Phosphatase 63 39 - 117 U/L   Total Bilirubin 0.7 0.3 - 1.2 mg/dL   GFR calc non Af Amer >90 >90 mL/min   GFR calc Af Amer >90 >90 mL/min    Comment: (NOTE) The eGFR has been calculated using the CKD EPI equation. This calculation has not been validated in all clinical situations. eGFR's persistently <90 mL/min signify possible Chronic Kidney Disease.    Anion gap 12 5 - 15  Magnesium     Status: None   Collection Time: 11/02/14  4:00 AM  Result Value Ref Range   Magnesium 2.0 1.5 - 2.5 mg/dL   Labs are reviewed.  Current Facility-Administered Medications  Medication Dose Route Frequency Provider Last Rate Last Dose  . 0.9 %  sodium chloride infusion   Intravenous Continuous Wilhelmina Mcardle, MD 5 mL/hr at 10/31/14 2000    . [START ON 11/08/2014]  amiodarone (PACERONE) tablet 400 mg  400 mg Oral Daily Dayna N Dunn, PA-C       Followed by  . amiodarone (PACERONE) tablet 400 mg  400 mg Oral BID Dayna N Dunn, PA-C       Followed by  . [START ON 11/15/2014] amiodarone (PACERONE) tablet 200 mg  200 mg Oral Daily Dayna N Dunn, PA-C      . antiseptic oral rinse (CPC / CETYLPYRIDINIUM CHLORIDE 0.05%) solution 7 mL  7 mL Mouth Rinse q12n4p Candee Furbish, MD   7 mL at 10/31/14 1600  . aspirin chewable tablet 81 mg  81 mg Oral Daily Almyra Deforest, [place]      . atorvastatin (LIPITOR) tablet 80 mg  80 mg Oral q1800 Wilhelmina Mcardle, MD   80 mg at 11/01/14 1657  . bisacodyl (DULCOLAX) EC tablet 5 mg  5 mg Oral Daily PRN Raylene Miyamoto, MD      . carvedilol (COREG) tablet 6.25 mg  6.25 mg Oral BID WC Almyra Deforest, PA   6.25 mg at 11/02/14 0853  . cefTRIAXone (ROCEPHIN) 1 g in dextrose 5 % 50 mL IVPB - Premix  1 g Intravenous Q24H Candee Furbish, MD      . chlorhexidine (PERIDEX) 0.12 % solution 15 mL  15 mL Mouth Rinse BID Candee Furbish, MD   15 mL at 10/31/14 2045  . enoxaparin (LOVENOX) injection 40 mg  40 mg Subcutaneous Q24H Wilhelmina Mcardle, MD   40 mg at 10/31/14 2044  . fentaNYL (SUBLIMAZE) injection 25-100 mcg  25-100 mcg Intravenous Q2H PRN Wilhelmina Mcardle, MD   100 mcg at 11/01/14 2159  . hydrALAZINE (APRESOLINE) injection 10 mg  10 mg  Intravenous Q4H PRN Chesley Mires, MD   10 mg at 10/30/14 0314  . ipratropium-albuterol (DUONEB) 0.5-2.5 (3) MG/3ML nebulizer solution 3 mL  3 mL Nebulization Q6H PRN Candee Furbish, MD      . LORazepam (ATIVAN) injection 0.5 mg  0.5 mg Intravenous BID Raylene Miyamoto, MD   0.5 mg at 11/02/14 0857  . LORazepam (ATIVAN) injection 1 mg  1 mg Intravenous Once Jeryl Columbia, NP      . magnesium sulfate IVPB 2 g 50 mL  2 g Intravenous Once Reyne Dumas, MD      . metoprolol (LOPRESSOR) injection 2.5-5 mg  2.5-5 mg Intravenous Q3H PRN Wilhelmina Mcardle, MD      . pantoprazole (PROTONIX) EC tablet 40 mg  40 mg Oral Q1200 Candee Furbish,  MD   40 mg at 11/01/14 1209  . potassium chloride SA (K-DUR,KLOR-CON) CR tablet 40 mEq  40 mEq Oral BID Reyne Dumas, MD        Psychiatric Specialty Exam: Physical Exam as per history and physical  Review of Systems  Constitutional: Positive for malaise/fatigue.  Psychiatric/Behavioral: Positive for depression and substance abuse. The patient is nervous/anxious.     Blood pressure 124/74, pulse 67, temperature 97.4 F (36.3 C), temperature source Oral, resp. rate 18, height _0  (1.854 m), weight 64.683 kg (142 lb 9.6 oz), SpO2 100 %.Body mass index is 18.82 kg/(m^2).  General Appearance: Guarded  Eye Contact::  Fair  Speech:  Blocked, Garbled and Slurred  Volume:  Decreased  Mood:  Angry, Anxious and Irritable  Affect:  Inappropriate and Labile  Thought Process:  Disorganized and Loose  Orientation:  Full (Time, Place, and Person)  Thought Content:  Delusions and Paranoid Ideation  Suicidal Thoughts:  No  Homicidal Thoughts:  No  Memory:  Immediate;   Poor Recent;   Poor  Judgement:  Impaired  Insight:  Lacking  Psychomotor Activity:  Increased  Concentration:  Poor  Recall:  [place] of Knowledge:Poor  Language: Fair  Akathisia:  NA  Handed:  Right  AIMS (if indicated):     Assets:  Others:  unknown  Sleep:      Musculoskeletal: Strength & Muscle Tone: increased Gait & Station: unable to stand Patient leans: N/A  Treatment Plan Summary: Daily contact with patient to assess and evaluate symptoms and progress in treatment Medication management Patient has no capacity to make his own Medical decision as he has no understanding about his medical condition and required treatment   Carlye Panameno,JANARDHAHA R. 11/02/2014 10:54 AM

## 2014-11-02 NOTE — Progress Notes (Addendum)
PROGRESS NOTE    Smith MinceJames Rolfe ZOX:[id number] DOB: 01-30-1946 DOA: 10/27/2014 PCP: No primary care provider on file.  HPI/Brief narrative 68 year old male with history of cocaine abuse, found down for 5-10 minutes and in Vfib arrest in setting of inferior STEMI. He was resuscitated and underwent emergent cardiac catheterization on 10/27/14 (occluded distal RCA). Echo on 10/28/14 showed EF 20-25 percent with grade 1 diastolic dysfunction. Patient was in ICU under PCCM care and was transferred to floor on 11/01/14 under cardiology service with [facility] providing consultation.   Assessment/Plan:  1.  VF arrest in the setting of inferior STEMI/CAD - s/p PTCA with balloon angioplasty only of mid and distal RCA. Dr. Eden EmmsNishan d/w Dr. Clifton JamesMcAlhany yesterday - given homelessness and diffuse disease would not rx proximal and mid RCA - medical therapy - continue Brilinta, aspirin, lipitor, Coreg (will need to abstain from cocaine) - with his anoxic encephalopathy, agitation/noncompliance, and poor social situation he is unlikely to be a candidate for lifevest 2. 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 Started on lisinopril 2.5 mg. Not a candidate for AICD despite ischemic dilated cardiomyopathy.   3.   Early HCAP Vs Aspiration PNA (strep): change IV Zosyn to Rocephin based on sputum culture from 11/7. Anticipate completion of antibiotics on 11/13, total of 8 days  4. Cocaine abuse, homelessness, and agitation/inappropriate behavior-improving on Valium and lorazepam, anticipate discontinuation of sitter, restraints today in preparation for discharge tomorrow - psychiatry consultation today , Dr Elsie SaasJonnalagadda has seen the patient yesterday, awaiting further recommendations   5. Paroxysmal atrial fibrillation - per Dr. Herbie BaltimoreHarding, amiodarone 400mg  BID x 1 week then 400mg  daily for 1 week then 200mg  daily -hold off on anticoagulation per  cardiology   Other chronic issues 1. Hypertension: Controlled. 2. Hypernatremia: Resolved 3. Hypokalemia: replace as needed and follow BMP. 4. Gastric ileus: Resolved and tolerating diet per nursing. 5. Normocytic anemia: Stable. 6. Post anoxic acute encephalopathy: seems confused. Requiring a sitter, restraints reviewed, psychiatry consultation today 7. S/p Fall 11/10: patient attempted to get off the chair and apparently tripped and sustained a fall. No reported injuries. Fall precautions and monitor. 8. Abnormal LFTs/transaminitis: Improving. ? Shock liver. Ultrasound abdomen shows gallbladder sludge without evidence of acute cholecystitis.   Code Status: Full  Family Communication: Discussed with daughter at bedside. Disposition Plan: anticipate discharge tomorrow, has 3 bed offers, psychiatry consultation today to determine capacity   SIGNIFICANT EVENTS/STUDIES: 11/05 Vfib arrest with 5- 15 minutes down time. DCCV X 4 reported 11/05 LHC: Inferior STEMI secondary to occluded distal RCA. Diffuse heavy calcification in the entire RCA with multiple segments of severe stenosis. Severe LV systolic dysfunction 11/05 CT head: NAD 11/06 TTE: LVEF 20-25%. AK of basal-midinferior myocardium. Grade 1 DD 11/06 EEG: mod-severe generalized slowing without epileptiform discharges 11/07 Passed SBT. Very agitated and not F/C on WUA. Extubated and no distress post extubation. dexmedetomidine gtt ordered 11/08 Has tolerated extubation. Cognition much improved. Abdominal exam abnormal/firm with diminished BS. High NGT output. Amylase/lipase/LFTs ordered 11/8 amio for fib 11/9 - high output from NGT  Consultants:  PCCM  Cardiology  Procedures: ETT 11/05 >> 11/07 L radial A line 11/05 >> 11/08 R femoral venous sheath 11/05 >> 11/07  Antibiotics: Ceftriaxone 11/07 >> 11/08 Pip-tazo 11/08 >>   Subjective: Denies chest pain. However, there are about 5 security guards in the room trying to  get him to calm down. He apparently has been trying to throw urine on staff and feces in  the room. Per notes last night, the patient accused the nurses of playing with him when they were cleaning up the bed because he spilled the urinal on it. He is agitated. His son tells me this is unfortunately what he is like on a regular basis.  Objective: Filed Vitals:   10/31/14 2259 11/01/14 1033 11/01/14 1352 11/02/14 0450  BP: 143/74 137/82 131/72 124/74  Pulse: 73 66 65 67  Temp: 99.1 F (37.3 C) 97.8 F (36.6 C) 98.9 F (37.2 C) 97.4 F (36.3 C)  TempSrc: Oral Oral Oral Oral  Resp: 20 20 20 18   Height:      Weight: 67.994 kg (149 lb 14.4 oz)   64.683 kg (142 lb 9.6 oz)  SpO2: 92% 98% 100% 100%    Intake/Output Summary (Last 24 hours) at 11/02/14 1102 Last data filed at 11/01/14 1407  Gross per 24 hour  Intake    240 ml  Output      0 ml  Net    240 ml   Filed Weights   10/31/14 0500 10/31/14 2259 11/02/14 0450  Weight: 66.2 kg (145 lb 15.1 oz) 67.994 kg (149 lb 14.4 oz) 64.683 kg (142 lb 9.6 oz)     Exam:  General: Well developed thin AAM in no acute distress. Head: Normocephalic, atraumatic, sclera non-icteric, no xanthomas, nares are without discharge. Neck: JVP not elevated. Lungs: Clear bilaterally to auscultation without wheezes, rales, or rhonchi. Breathing is unlabored. Heart: RRR S1 S2 without murmurs, rubs, or gallops.  Abdomen: Soft, non-tender, non-distended.  Extremities: No clubbing or cyanosis. No edema. Distal pedal pulses are 2+ and equal bilaterally. Neuro: Currently combative and requiring restraints but was cooperative with respiratory and heart exam.   Data Reviewed: Basic Metabolic Panel:  Recent Labs Lab 10/29/14 0541 10/30/14 0350 10/31/14 0254 11/01/14 0417 11/02/14 0400  NA 137 140 138 138 142  K 4.5 4.2 3.1* 3.6* 3.0*  CL 100 101 98 101 103  CO2 24 24 27 25 27   GLUCOSE 99 94 158* 95 117*  BUN 10 12 14 13 13   CREATININE 0.79 0.64 0.79  0.74 0.78  CALCIUM 8.9 9.5 8.9 8.9 9.0  MG  --  1.6  --   --  2.0   Liver Function Tests:  Recent Labs Lab 10/29/14 0541 10/30/14 1410 10/31/14 0254 11/01/14 0417 11/02/14 0400  AST 163* 170* 161* 116* 74*  ALT 85* 67* 58* 54* 46  ALKPHOS 92 79 73 64 63  BILITOT 0.5 0.3 0.5 0.7 0.7  PROT 6.8 6.4 6.0 6.1 6.0  ALBUMIN 3.2* 2.9* 2.8* 2.8* 2.8*    Recent Labs Lab 10/30/14 1410  LIPASE 8*  AMYLASE 250*   No results for input(s): AMMONIA in the last 168 hours. CBC:  Recent Labs Lab 10/28/14 0600 10/29/14 0541 10/30/14 0350 10/31/14 0254 10/31/14 1011  WBC 6.3 9.1 14.2* 8.4 9.6  NEUTROABS  --   --   --   --  8.2*  HGB 13.8 13.1 12.6* 10.7* 11.3*  HCT 39.9 39.0 37.2* 32.3* 33.6*  MCV 84.2 85.2 84.9 84.6 86.6  PLT 194 207 200 172 175   Cardiac Enzymes:  Recent Labs Lab 10/27/14 1611 10/27/14 2145 10/28/14 0406  TROPONINI 2.30* 2.15* 1.53*   BNP (last 3 results) No results for input(s): PROBNP in the last 8760 hours. CBG:  Recent Labs Lab 11/01/14 0539 11/01/14 0810 11/01/14 1127 11/01/14 1623 11/02/14 0252  GLUCAP 91 89 96 91 129*    Recent Results (  from the past 240 hour(s))  Urine culture     Status: None   Collection Time: 10/27/14  4:35 PM  Result Value Ref Range Status   Specimen Description URINE, RANDOM  Final   Special Requests Normal  Final   Culture  Setup Time   Final    10/27/2014 21:50 Performed at [facility]    Colony Count NO GROWTH Performed at [facility]   Final   Culture NO GROWTH Performed at [facility]   Final   Report Status 10/28/2014 FINAL  Final  MRSA PCR Screening     Status: None   Collection Time: 10/27/14  4:35 PM  Result Value Ref Range Status   MRSA by PCR NEGATIVE NEGATIVE Final    Comment:        The GeneXpert MRSA Assay (FDA approved for NASAL specimens only), is one component of a comprehensive MRSA colonization surveillance program. It is not intended to diagnose  MRSA infection nor to guide or monitor treatment for MRSA infections.   Culture, blood (routine x 2)     Status: None (Preliminary result)   Collection Time: 10/27/14  7:30 PM  Result Value Ref Range Status   Specimen Description BLOOD LEFT ARM  Final   Special Requests BOTTLES DRAWN AEROBIC AND ANAEROBIC 5 CC  Final   Culture  Setup Time   Final    10/28/2014 01:00 Performed at [facility]    Culture   Final           BLOOD CULTURE RECEIVED NO GROWTH TO DATE CULTURE WILL BE HELD FOR 5 DAYS BEFORE ISSUING A FINAL NEGATIVE REPORT Performed at [facility]    Report Status PENDING  Incomplete  Culture, respiratory (NON-Expectorated)     Status: None   Collection Time: 10/29/14  9:15 AM  Result Value Ref Range Status   Specimen Description TRACHEAL ASPIRATE  Final   Special Requests NONE  Final   Gram Stain   Final    ABUNDANT WBC PRESENT, PREDOMINANTLY PMN NO SQUAMOUS EPITHELIAL CELLS SEEN MODERATE GRAM POSITIVE COCCI IN PAIRS IN CHAINS IN CLUSTERS Performed at [facility]    Culture   Final    ABUNDANT STREPTOCOCCUS PNEUMONIAE Performed at [facility]    Report Status 11/01/2014 FINAL  Final   Organism ID, Bacteria STREPTOCOCCUS PNEUMONIAE  Final      Susceptibility   Streptococcus pneumoniae - MIC (ETEST)*    CEFTRIAXONE 0.38 SENSITIVE Sensitive     LEVOFLOXACIN 0.75 SENSITIVE Sensitive     PENICILLIN 0.75 INTERMEDIATE Intermediate     * ABUNDANT STREPTOCOCCUS PNEUMONIAE  Culture, Urine     Status: None   Collection Time: 10/29/14  8:16 PM  Result Value Ref Range Status   Specimen Description URINE, CATHETERIZED  Final   Special Requests NONE  Final   Culture  Setup Time   Final    10/29/2014 21:29 Performed at [facility]    Colony Count   Final    50,000 COLONIES/ML Performed at [facility]    Culture   Final    Multiple bacterial morphotypes present, none predominant. Suggest appropriate recollection if  clinically indicated. Performed at [facility]    Report Status 10/31/2014 FINAL  Final  Culture, blood (routine x 2)     Status: None (Preliminary result)   Collection Time: 10/29/14  9:09 PM  Result Value Ref Range Status   Specimen Description BLOOD LEFT ANTECUBITAL  Final   Special Requests BOTTLES DRAWN AEROBIC ONLY 5CC  Final   Culture  Setup Time   Final    10/30/2014 00:58 Performed at [facility]    Culture   Final           BLOOD CULTURE RECEIVED NO GROWTH TO DATE CULTURE WILL BE HELD FOR 5 DAYS BEFORE ISSUING A FINAL NEGATIVE REPORT Performed at [facility]    Report Status PENDING  Incomplete  Culture, blood (routine x 2)     Status: None (Preliminary result)   Collection Time: 10/29/14  9:25 PM  Result Value Ref Range Status   Specimen Description BLOOD LEFT ANTECUBITAL  Final   Special Requests BOTTLES DRAWN AEROBIC ONLY 1CC  Final   Culture  Setup Time   Final    10/30/2014 00:58 Performed at [facility]    Culture   Final           BLOOD CULTURE RECEIVED NO GROWTH TO DATE CULTURE WILL BE HELD FOR 5 DAYS BEFORE ISSUING A FINAL NEGATIVE REPORT Performed at [facility]    Report Status PENDING  Incomplete        Studies: US Abdomen Complete  10/31/2014   CLINICAL DATA:  Abdominal pain.  Diffuse abdominal pain.  EXAM: ULTRASOUND ABDOMEN COMPLETE  COMPARISON:  Radiographs 10/31/2014  FINDINGS: Gallbladder: Large volume of sludge layers dependently within the lumen of the gallbladder. There is no gallbladder wall thickening or pericholecystic fluid. Negative sonographic Murphy's sign.  Common bile duct: Diameter: Upper limits of normal at 6 mm.  Liver: No focal lesion identified. Within normal limits in parenchymal echogenicity.  IVC: No abnormality visualized.  Pancreas: Not identified  Spleen: Size and appearance within normal limits.  Right Kidney: Length: 10.9 cm. Echogenicity within normal limits. No mass or  hydronephrosis visualized.  Left Kidney: Length: 11.5 cm. Echogenicity within normal limits. No mass or hydronephrosis visualized.  Abdominal aorta: No aneurysm visualized.  Other findings: No free fluid.  IMPRESSION: 1. Gallbladder sludge without evidence of acute cholecystitis. 2. Common bile duct upper limits of normal.   Electronically Signed   By: Genevive Bi M.D.   On: 10/31/2014 14:36   Dg Chest Port 1 View  11/01/2014   CLINICAL DATA:  Pulmonary edema  EXAM: PORTABLE CHEST - 1 VIEW  COMPARISON:  Chest radiograph 10/29/2014 and acute abdominal series 11 08/2014  FINDINGS: Cardiomegaly appears similar to recent prior studies. There is cephalization above the pulmonary vascularity. There is diffuse interstitial prominence bilaterally with mild perihilar and right basilar airspace disease. No visible pleural effusion on these portable AP views of the chest. Negative for pneumothorax. Thin linear sclerotic area associated with the anterior lateral left sixth rib is unchanged dating back to 10/27/2014 and could reflect remote posttraumatic changes.  IMPRESSION: Mild pulmonary edema pattern.   Electronically Signed   By: Britta Mccreedy M.D.   On: 11/01/2014 14:00        Scheduled Meds: . [START ON 11/08/2014] amiodarone  400 mg Oral Daily   Followed by  . amiodarone  400 mg Oral BID   Followed by  . [START ON 11/15/2014] amiodarone  200 mg Oral Daily  . antiseptic oral rinse  7 mL Mouth Rinse q12n4p  . aspirin  81 mg Oral Daily  . atorvastatin  80 mg Oral q1800  . carvedilol  6.25 mg Oral BID WC  . cefTRIAXone (ROCEPHIN)  IV  1 g Intravenous Q24H  . chlorhexidine  15 mL Mouth Rinse BID  . enoxaparin (LOVENOX) injection  40 mg Subcutaneous Q24H  . LORazepam  0.5 mg Intravenous BID  . LORazepam  1 mg Intravenous Once  . magnesium sulfate 1 - 4 g bolus IVPB  2 g Intravenous Once  . pantoprazole  40 mg Oral Q1200  . potassium chloride  40 mEq Oral BID   Continuous Infusions: . sodium  chloride 5 mL/hr at 10/31/14 2000    Principal Problem:   Ventricular fibrillation - 2/2 Inferior STEMI Active Problems:   Inferior MI   Protein-calorie malnutrition, severe   Respiratory failure   STEMI (ST elevation myocardial infarction)   Hypoxic ischemic encephalopathy (HIE)   Abdominal pain   Hyperlipidemia with target LDL less than 70   Pre-diabetes   Transaminitis   Hypokalemia   Atrial fibrillation   Cocaine abuse   Chronic combined systolic and diastolic CHF (congestive heart failure)   Acute blood loss anemia   CAD S/P percutaneous coronary angioplasty - PTCA only of dRCA 100% for STEMI, heavily calcified vessel with extensive disease   Cardiac arrest    Time spent: 30 minutes.    Richarda Overlie, MD,   [facility] Pager [phone]  If 7PM-7AM, please contact night-coverage [email or web address] Password [email or web address] 11/02/2014, 11:02 AM    LOS: 6 days

## 2014-11-03 LAB — COMPREHENSIVE METABOLIC PANEL
ALK PHOS: 72 U/L (ref 39–117)
ALT: 43 U/L (ref 0–53)
AST: 53 U/L — AB (ref 0–37)
Albumin: 2.9 g/dL — ABNORMAL LOW (ref 3.5–5.2)
Anion gap: 13 (ref 5–15)
BILIRUBIN TOTAL: 0.6 mg/dL (ref 0.3–1.2)
BUN: 12 mg/dL (ref 6–23)
CHLORIDE: 104 meq/L (ref 96–112)
CO2: 25 meq/L (ref 19–32)
Calcium: 8.9 mg/dL (ref 8.4–10.5)
Creatinine, Ser: 0.75 mg/dL (ref 0.50–1.35)
GLUCOSE: 98 mg/dL (ref 70–99)
Potassium: 3.7 mEq/L (ref 3.7–5.3)
SODIUM: 142 meq/L (ref 137–147)
Total Protein: 6.5 g/dL (ref 6.0–8.3)

## 2014-11-03 LAB — CBC
HCT: 34 % — ABNORMAL LOW (ref 39.0–52.0)
Hemoglobin: 11.3 g/dL — ABNORMAL LOW (ref 13.0–17.0)
MCH: 28.3 pg (ref 26.0–34.0)
MCHC: 33.2 g/dL (ref 30.0–36.0)
MCV: 85 fL (ref 78.0–100.0)
PLATELETS: 236 10*3/uL (ref 150–400)
RBC: 4 MIL/uL — ABNORMAL LOW (ref 4.22–5.81)
RDW: 13.8 % (ref 11.5–15.5)
WBC: 7.3 10*3/uL (ref 4.0–10.5)

## 2014-11-03 LAB — CULTURE, BLOOD (ROUTINE X 2): Culture: NO GROWTH

## 2014-11-03 MED ORDER — DIAZEPAM 5 MG/ML IJ SOLN: 5.0000 mg | Freq: Once | INTRAMUSCULAR | Status: AC

## 2014-11-03 MED ORDER — CLONAZEPAM 0.5 MG PO TABS
0.5000 mg | ORAL_TABLET | Freq: Two times a day (BID) | ORAL | Status: DC
  Administered 2014-11-03 – 2014-11-05 (×4): 0.5 mg via ORAL
  Filled 2014-11-03 (×4): qty 1

## 2014-11-03 MED ORDER — DIVALPROEX SODIUM ER 500 MG PO TB24
500.0000 mg | ORAL_TABLET | Freq: Two times a day (BID) | ORAL | Status: DC
  Administered 2014-11-03 – 2014-11-05 (×4): 500 mg via ORAL
  Filled 2014-11-03 (×5): qty 1

## 2014-11-03 MED ORDER — LISINOPRIL 2.5 MG PO TABS
2.5000 mg | ORAL_TABLET | Freq: Every day | ORAL | Status: DC
  Administered 2014-11-03 – 2014-11-05 (×3): 2.5 mg via ORAL
  Filled 2014-11-03 (×3): qty 1

## 2014-11-03 NOTE — Progress Notes (Addendum)
Physical Therapy Treatment Patient Details Name: [name] Asante MRN: [id number] DOB: 1946-03-11 Today's Date: 11/03/2014    History of Present Illness This 68 y.o. male admitted 10/27/14 after witnessed v-fib arrest in a homeless shelter.  Pt was down 5-10 mins prior to initiaion of CPR and debrilation - STEMI.  Pt extubated 10/29/14.  PMH:  a-Fib; cocaine abuse; chronic combined systolic and diastolic CHF; CAD    PT Comments    Patient was restless but fairly agreeable during session.  HR at rest was 61, HR during ambulation was 64.  Pt requires constant verbal cues to correct gait deviations and to ambulate safely.  He is able to ambulate without physical assistance but requires constant guarding for safety and direction.  Pt would continue to benefit from skilled therapy to address gait and balance in order to increase independence with functional activities.   Follow Up Recommendations  CIR     Equipment Recommendations  None recommended by PT    Recommendations for Other Services       Precautions / Restrictions Precautions Precautions: Fall Precaution Comments: confused Restrictions Weight Bearing Restrictions: No    Mobility  Bed Mobility                  Transfers Overall transfer level: Needs assistance Equipment used: None Transfers: Sit to/from Stand Sit to Stand: Min guard (for patient safety; to maintain balance)         General transfer comment: Pt continues to have poor balance with transfers; he is moving quickly and without forethought.  Ambulation/Gait Ambulation/Gait assistance: Min assist Ambulation Distance (Feet): 800 Feet Assistive device: 1 person hand held assist Gait Pattern/deviations: Narrow base of support;Drifts right/left;Step-through pattern;Scissoring (increased sway; toe-in bilateral; flexed knees) Gait velocity: normal   General Gait Details: Pt wants to walk quickly but his gait pattern is impaired and causing him to stumble  over his feet and walk into things.  Requires consistent verbal cues to toe-out and widen base of support.  He denies fatigue or pain during ambulation.     Stairs            Wheelchair Mobility    Modified Rankin (Stroke Patients Only)       Balance Overall balance assessment: Needs assistance Sitting-balance support: Feet supported Sitting balance-Leahy Scale: Good     Standing balance support: No upper extremity supported Standing balance-Leahy Scale: Good Standing balance comment: Requires guarding during ambulation to prevent loss of balance                    Cognition Arousal/Alertness: Awake/alert Behavior During Therapy: Restless;Impulsive Overall Cognitive Status: Impaired/Different from baseline Area of Impairment: Orientation;Attention;Following commands;Safety/judgement;Memory;Awareness Orientation Level: Disoriented to;Person;Situation Current Attention Level: Selective Memory: Decreased short-term memory Following Commands: Follows one step commands consistently Safety/Judgement: Decreased awareness of safety;Decreased awareness of deficits Awareness: Intellectual Problem Solving: Difficulty sequencing;Requires verbal cues;Slow processing General Comments: Pt remains distracted and impulsive; requiring verbal cues for focused attention. Consistently confusing family members identities    Exercises      General Comments        Pertinent Vitals/Pain Pain Assessment: No/denies pain  Resting HR: 61 Active HR: 64    Home Living                      Prior Function            PT Goals (current goals can now be found in the care plan section) Acute Rehab  PT Goals PT Goal Formulation: With patient Time For Goal Achievement: 11/14/14 Potential to Achieve Goals: Good Progress towards PT goals: Progressing toward goals    Frequency  Min 3X/week    PT Plan Current plan remains appropriate    Co-evaluation              End of Session Equipment Utilized During Treatment: Gait belt Activity Tolerance: Patient tolerated treatment well (Patient is motivated to walk and does not want to sit) Patient left: in chair;with call bell/phone within reach;with family/visitor present;with chair alarm set     Time:  -     Charges:                       G Codes:      Scorpio Fortin SPT 11/03/2014, 2:09 PM

## 2014-11-03 NOTE — Progress Notes (Signed)
Occupational Therapy Treatment Patient Details Name: Smith MinceJames Rochelle MRN: [id number] DOB: 1946-03-06 Today's Date: 11/03/2014    History of present illness This 68 y.o. male admitted 10/27/14 after witnessed v-fib arrest in a homeless shelter.  Pt was down 5-10 mins prior to initiaion of CPR and debrilation - STEMI.  Pt extubated 10/29/14.  PMH:  a-Fib; cocaine abuse; chronic combined systolic and diastolic CHF; CAD   OT comments  Pt improving.  Requires min a for BADLs due to cognitive deficits and impaired balance.   Follow Up Recommendations  Supervision/Assistance - 24 hour;CIR    Equipment Recommendations  3 in 1 bedside comode    Recommendations for Other Services      Precautions / Restrictions Precautions Precautions: Fall Precaution Comments: confused Restrictions Weight Bearing Restrictions: No       Mobility Bed Mobility                  Transfers Overall transfer level: Needs assistance Equipment used: None Transfers: Sit to/from Stand;Stand Pivot Transfers Sit to Stand: Min guard Stand pivot transfers: Min assist;+2 physical assistance       General transfer comment: requires min A and a second person for safety     Balance Overall balance assessment: Needs assistance Sitting-balance support: Feet supported Sitting balance-Leahy Scale: Good     Standing balance support: During functional activity Standing balance-Leahy Scale: Fair Standing balance comment: Requires guarding during ambulation to prevent loss of balance                   ADL Overall ADL's : Needs assistance/impaired     Grooming: Wash/dry hands;Wash/dry face;Oral care;Minimal assistance;Standing Grooming Details (indicate cue type and reason): Requries min A for standing balance              Lower Body Dressing: Minimal assistance;+2 for safety/equipment;Sit to/from stand   Toilet Transfer: Minimal assistance;+2 for safety/equipment;Ambulation;Comfort height  toilet   Toileting- Clothing Manipulation and Hygiene: Minimal assistance;+2 for physical assistance;Sit to/from stand       Functional mobility during ADLs: Minimal assistance;+2 for safety/equipment        Vision                     Perception     Praxis      Cognition   Behavior During Therapy: Restless;Impulsive Overall Cognitive Status: Impaired/Different from baseline Area of Impairment: Orientation;Attention;Memory;Following commands;Safety/judgement;Awareness;Problem solving Orientation Level: Disoriented to;Time;Situation Current Attention Level: Sustained Memory: Decreased short-term memory;Decreased recall of precautions  Following Commands: Follows one step commands consistently Safety/Judgement: Decreased awareness of safety;Decreased awareness of deficits Awareness: Intellectual Problem Solving: Slow processing;Difficulty sequencing;Requires verbal cues;Requires tactile cues General Comments: Pt remains very confused.  Unaware of why he is in hospital.  Poor safety awareness and judgement     Extremity/Trunk Assessment               Exercises     Shoulder Instructions       General Comments      Pertinent Vitals/ Pain       Pain Assessment: No/denies pain  Home Living                                          Prior Functioning/Environment              Frequency Min 2X/week     Progress Toward Goals  OT Goals(current goals can now be found in the care plan section)  Progress towards OT goals: Progressing toward goals  ADL Goals Pt Will Perform Grooming: with min assist;standing Pt Will Perform Upper Body Bathing: with supervision;sitting Pt Will Perform Lower Body Bathing: with min assist;sit to/from stand Pt Will Perform Upper Body Dressing: with supervision;sitting Pt Will Perform Lower Body Dressing: with min assist;sit to/from stand Pt Will Transfer to Toilet: with min assist;ambulating;regular height  toilet;bedside commode;grab bars Pt Will Perform Toileting - Clothing Manipulation and hygiene: with min assist;sit to/from stand Additional ADL Goal #1: Pt will be will maintain attention x 8 mins during BADLs with min verbal cues   Plan Discharge plan remains appropriate    Co-evaluation                 End of Session Equipment Utilized During Treatment: Gait belt   Activity Tolerance Patient tolerated treatment well   Patient Left Other (comment) (with PT )   Nurse Communication Mobility status        Time: [id number] OT Time Calculation (min): 18 min  Charges: OT General Charges $OT Visit: 1 Procedure OT Treatments $Self Care/Home Management : 8-22 mins  Aalaiyah Yassin M 11/03/2014, 2:14 PM

## 2014-11-03 NOTE — Progress Notes (Signed)
Patient became very agitated after visiting with family/friends. Patient tried to leave with family friend and refused to come back with staff. Family was standing in the hallway videoing patient acting out. Security was called to assist with getting patient back to his room. MD on call made aware and orders received. Bradley FerrisBrandie Chanika Byland RN BSN 11/03/2014

## 2014-11-03 NOTE — Clinical Social Work Note (Signed)
Spoke to patient and his daughter in his room.  Patient was informed that he had received bed offers to go to a SNF for short term rehab.  Explained to patient that he would be getting physical therapy to work on improving his balance and getting his strength back per recommendation by PT and OT.  Explained to patient that a SNF is a nursing home which has physical therapy and occupation therapy who would work with him.  Patient's daughter [facility], and CSW informed her that yes they did.  Patient, and daughter, stated they would like to go to SNF and asked if CSW would contact brother Jannifer HickJeff Staley at [phone] to give an update on patients progress.  Informed patient, daughter, and brother that the physician may discharge tomorrow depending on how patient progresses and if he is medically stable enough.  Contacted nurse liaison for [facility] to inform her patient is interested in going to facility.  Contacted patients son and gave him phone number for nurse liaison who he can call if he has any questions.  Scott KnackEric R. Juanjesus Bryant, MSW, [name] [phone] 11/03/2014 5:08 PM

## 2014-11-03 NOTE — Progress Notes (Signed)
Talked with Dr. Susie CassetteAbrol who has agree to take the patient onto her service. Stable from cardiac perspective. Social worker consulted for potential inpatient nursing facility placement.   Ramond DialSigned, Seiji Wiswell PA Pager: [phone]

## 2014-11-03 NOTE — Progress Notes (Signed)
Patient ID: [name], male   DOB: 1946/08/20, 68 y.o.   MRN: [id number]  Patient: [name] / Admit Date: 10/27/2014 / Date of Encounter: 11/03/2014, 9:31 AM   Subjective: No complaints needs to go to bathroom  Objective: Telemetry: NSR Physical Exam: Blood pressure 136/74, pulse 69, temperature 97.6 F (36.4 C), temperature source Oral, resp. rate 18, height 6\' 1"  (1.854 m), weight 66.951 kg (147 lb 9.6 oz), SpO2 100 %. General: Well developed thin AAM in no acute distress. Head: Normocephalic, atraumatic, sclera non-icteric, no xanthomas, nares are without discharge. Neck: JVP not elevated. Lungs: Clear bilaterally to auscultation without wheezes, rales, or rhonchi. Breathing is unlabored. Heart: RRR S1 S2 without murmurs, rubs, or gallops.  Abdomen: Soft, non-tender, non-distended.  Extremities: No clubbing or cyanosis. No edema. Distal pedal pulses are 2+ and equal bilaterally. Neuro: Currently combative and requiring restraints but was cooperative with respiratory and heart exam   Intake/Output Summary (Last 24 hours) at 11/03/14 0931 Last data filed at 11/03/14 0559  Gross per 24 hour  Intake    240 ml  Output    125 ml  Net    115 ml    Inpatient Medications:  . [START ON 11/08/2014] amiodarone  400 mg Oral Daily   Followed by  . amiodarone  400 mg Oral BID   Followed by  . [START ON 11/15/2014] amiodarone  200 mg Oral Daily  . antiseptic oral rinse  7 mL Mouth Rinse q12n4p  . aspirin  81 mg Oral Daily  . atorvastatin  80 mg Oral q1800  . carvedilol  6.25 mg Oral BID WC  . cefTRIAXone (ROCEPHIN)  IV  1 g Intravenous Q24H  . chlorhexidine  15 mL Mouth Rinse BID  . enoxaparin (LOVENOX) injection  40 mg Subcutaneous Q24H  . lisinopril  2.5 mg Oral Daily  . LORazepam  1 mg Intravenous Once  . LORazepam  0.5 mg Oral BID  . pantoprazole  40 mg Oral Q1200   Infusions:  . sodium chloride 5 mL/hr at 10/31/14 2000    Labs:  Recent Labs  11/02/14 0400  11/03/14 0307  NA 142 142  K 3.0* 3.7  CL 103 104  CO2 27 25  GLUCOSE 117* 98  BUN 13 12  CREATININE 0.78 0.75  CALCIUM 9.0 8.9  MG 2.0  --     Recent Labs  11/02/14 0400 11/03/14 0307  AST 74* 53*  ALT 46 43  ALKPHOS 63 72  BILITOT 0.7 0.6  PROT 6.0 6.5  ALBUMIN 2.8* 2.9*    Recent Labs  10/31/14 1011 11/03/14 0307  WBC 9.6 7.3  NEUTROABS 8.2*  --   HGB 11.3* 11.3*  HCT 33.6* 34.0*  MCV 86.6 85.0  PLT 175 236   No results for input(s): CKTOTAL, CKMB, TROPONINI in the last 72 hours. Invalid input(s): POCBNP No results for input(s): HGBA1C in the last 72 hours.   Radiology/Studies:  Ct Head Wo Contrast  10/27/2014   CLINICAL DATA:  Cardiac arrest.  EXAM: CT HEAD WITHOUT CONTRAST  TECHNIQUE: Contiguous axial images were obtained from the base of the skull through the vertex without intravenous contrast.  COMPARISON:  None.  FINDINGS: Bony calvarium appears intact. No mass effect or midline shift is noted. Ventricular size is within normal limits. There is no evidence of mass lesion, hemorrhage or acute infarction.  IMPRESSION: Normal head CT.   Electronically Signed   By: Roque Lias M.D.   On: 10/27/2014 16:04  [place] Abdomen Complete  10/31/2014   CLINICAL DATA:  Abdominal pain.  Diffuse abdominal pain.  EXAM: ULTRASOUND ABDOMEN COMPLETE  COMPARISON:  Radiographs 10/31/2014  FINDINGS: Gallbladder: Large volume of sludge layers dependently within the lumen of the gallbladder. There is no gallbladder wall thickening or pericholecystic fluid. Negative sonographic Murphy's sign.  Common bile duct: Diameter: Upper limits of normal at 6 mm.  Liver: No focal lesion identified. Within normal limits in parenchymal echogenicity.  IVC: No abnormality visualized.  Pancreas: Not identified  Spleen: Size and appearance within normal limits.  Right Kidney: Length: 10.9 cm. Echogenicity within normal limits. No mass or hydronephrosis visualized.  Left Kidney: Length: 11.5 cm. Echogenicity  within normal limits. No mass or hydronephrosis visualized.  Abdominal aorta: No aneurysm visualized.  Other findings: No free fluid.  IMPRESSION: 1. Gallbladder sludge without evidence of acute cholecystitis. 2. Common bile duct upper limits of normal.   Electronically Signed   By: Genevive BiStewart  Edmunds M.D.   On: 10/31/2014 14:36   Dg Chest Port 1 View  11/01/2014   CLINICAL DATA:  Pulmonary edema  EXAM: PORTABLE CHEST - 1 VIEW  COMPARISON:  Chest radiograph 10/29/2014 and acute abdominal series 11 08/2014  FINDINGS: Cardiomegaly appears similar to recent prior studies. There is cephalization above the pulmonary vascularity. There is diffuse interstitial prominence bilaterally with mild perihilar and right basilar airspace disease. No visible pleural effusion on these portable AP views of the chest. Negative for pneumothorax. Thin linear sclerotic area associated with the anterior lateral left sixth rib is unchanged dating back to 10/27/2014 and could reflect remote posttraumatic changes.  IMPRESSION: Mild pulmonary edema pattern.   Electronically Signed   By: Britta MccreedySusan  Turner M.D.   On: 11/01/2014 14:00   Dg Chest Port 1 View  10/29/2014   CLINICAL DATA:  Fever.  Smoker.  EXAM: PORTABLE CHEST - 1 VIEW  COMPARISON:  10/29/2014  FINDINGS: Endotracheal tube is been removed. Enteric tube tip is in the left upper quadrant consistent with location in the upper stomach. Shallow inspiration. Cardiac enlargement without significant vascular congestion. Atelectasis or infiltration in the lung bases, greater on the left. No pneumothorax. Similar appearance to previous study.  IMPRESSION: Cardiac enlargement. Shallow inspiration with atelectasis or infiltration in the lung bases, similar to prior study.   Electronically Signed   By: Burman NievesWilliam  Stevens M.D.   On: 10/29/2014 21:05   Dg Chest Port 1 View  10/29/2014   CLINICAL DATA:  68 year old male currently admitted with acute respiratory failure.  EXAM: PORTABLE CHEST - 1  VIEW  COMPARISON:  Prior chest x-ray 10/27/2014  FINDINGS: Endotracheal tube 7.1 cm above the carina. The tip of the nasogastric tube overlies the upper stomach just beyond the GE junction. Stable borderline cardiomegaly. Developing hazy airspace opacity in the right lower lobe concerning for pneumonia versus aspiration. Small left pleural effusion and associated left basilar atelectasis. Mild pulmonary vascular congestion without overt edema. No pneumothorax. No acute osseous abnormality.  IMPRESSION: 1. Developing hazy opacity in the medial right lower lobe concerning for pneumonia or aspiration. 2. Trace left pleural effusion with associated atelectasis. 3. Stable borderline cardiomegaly. 4. Stable position of endotracheal tube. 5. The tip of the nasogastric tube overlies the upper stomach just beyond the GE junction. Consider advancing several cm.   Electronically Signed   By: Malachy MoanHeath  McCullough M.D.   On: 10/29/2014 07:42   Dg Chest Portable 1 View  10/27/2014   CLINICAL DATA:  Weakness cardiac arrest.  Unresponsive.  EXAM: PORTABLE CHEST - 1 VIEW  COMPARISON:  None.  FINDINGS: Endotracheal tube with tip near the clavicular heads. An orogastric tube reaches the stomach.  There is no edema, consolidation, effusion, or pneumothorax. Thin line left lateral to the descending thoracic aorta favors minimal atelectasis. No convincing pneumomediastinum.  No visible rib fracture.  IMPRESSION: 1. Endotracheal and orogastric tubes are in good position. 2. No cardiomegaly or pulmonary edema.   Electronically Signed   By: Tiburcio PeaJonathan  Watts M.D.   On: 10/27/2014 13:48   Dg Abd Acute W/chest  10/31/2014   CLINICAL DATA:  Lower chest and upper abdominal pain.  EXAM: ACUTE ABDOMEN SERIES (ABDOMEN 2 VIEW & CHEST 1 VIEW)  COMPARISON:  10/29/2014  FINDINGS: The heart is mildly enlarged. There is density at the left lung base partially obscuring the hemidiaphragm and associated with air bronchograms. There has been some  improvement in aeration of the right lung base. No free intraperitoneal air.  Bowel gas pattern is nonobstructive. No evidence for organomegaly. No abnormal calcifications.  IMPRESSION: 1. Cardiomegaly. 2. Left lower lobe infiltrate. 3. Nonobstructive bowel gas pattern.   Electronically Signed   By: Rosalie GumsBeth  Brown M.D.   On: 10/31/2014 07:06   Dg Abd Portable 1v  10/29/2014   CLINICAL DATA:  Nasogastric tube placement  EXAM: PORTABLE ABDOMEN - 1 VIEW  COMPARISON:  No similar prior exam is available at this institution for comparison or on [facility] P[facility].  FINDINGS: Artifact from multiple support devices is noted. Nasogastric tube tip visualized over the body of the stomach. Side hole is at the GE junction which may increase the risk of reflux, consider advancing 3-5 cm.  IMPRESSION: Tip of NG tube projects over the body of the stomach. Consider advancing 3-5 cm for placement of the side hole distal to the GE junction.   Electronically Signed   By: Christiana PellantGretchen  Green M.D.   On: 10/29/2014 09:50     Assessment and Plan  1. VF arrest in the setting of inferior STEMI/CAD - s/p PTCA with balloon angioplasty only of mid and distal RCA. Dr. Eden EmmsNishan d/w Dr. Clifton JamesMcAlhany yesterday - given homelessness and diffuse disease would not rx proximal and mid RCA - medical therapy - continue Brilinta, aspirin, lipitor, Coreg (will need to abstain from cocaine) - with his anoxic encephalopathy, agitation/noncompliance, and poor social situation he is unlikely to be a candidate for lifevest 2. 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 VDRF post arrest with early HCAP vs aspiration PNA (strep) - appreciate IM assistance 4. Cocaine abuse, homelessness, and agitation/inappropriate behavior  - Main question for psych is does he need inpatient psych admission or SNF  Spoke with daughter at length.  She indicates he does actively do drugs.  He is not homeless but has  apartment With roommate.  Indicates prior to MI and admission he was able to live independently without previous psychological issues 5. Post-anoxic encephalopathy - improved but son states agitation is baseline 6. Paroxysmal atrial fibrillation - per Dr. Herbie BaltimoreHarding, 400mg  BID x 1 week then 400mg  daily for 1 week then 200mg  daily (I adjusted order in Epic to automatically dose this way)  7. HTN 8. Lyte abnormalities of hypernatremia, hypokalemia - internal medicine is repleting  9. Gastric ileus 10. S/p fall 11/10 - patient attempted to get off chair, tripped and fell without reported injuries 11. Abnormal LFTs, possibly d/t shock liver, improving 12. Severe protein calorie malnutrition  13. Hyperlipidemia 14. Pre-diabetes 15. ABL anemia   Transfer to medical service to await placement  Charlton Haws

## 2014-11-03 NOTE — Plan of Care (Signed)
Problem: Phase III Progression Outcomes Goal: OOB with assistance Outcome: Completed/Met Date Met:  11/03/14 Goal: Tolerating diet Outcome: Completed/Met Date Met:  11/03/14

## 2014-11-03 NOTE — Progress Notes (Addendum)
PROGRESS NOTE    [name] AVW:[id number] DOB: 1946/04/02 DOA: 10/27/2014 PCP: No primary care provider on file.  HPI/Brief narrative 68 year old male with history of cocaine abuse, found down for 5-10 minutes and in Vfib arrest in setting of inferior STEMI. He was resuscitated and underwent emergent cardiac catheterization on 10/27/14 (occluded distal RCA). Echo on 10/28/14 showed EF 20-25 percent with grade 1 diastolic dysfunction. Patient was in ICU under PCCM care and was transferred to floor on 11/01/14 [facility]   Assessment/Plan:  1.  VF arrest in the setting of inferior STEMI/CAD - s/p PTCA with balloon angioplasty only of mid and distal RCA. Dr. Eden EmmsNishan d/w Dr. Clifton JamesMcAlhany yesterday - given homelessness and diffuse disease would not rx proximal and mid RCA - medical therapy - continue Brilinta, aspirin, lipitor, Coreg (will need to abstain from cocaine) - with his anoxic encephalopathy, agitation/noncompliance, and poor social situation he is unlikely to be a candidate for lifevest  2. 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 Started on lisinopril 2.5 mg. Not a candidate for AICD despite ischemic dilated cardiomyopathy.   3.   Early HCAP Vs Aspiration PNA (strep): change IV Zosyn to Rocephin based on sputum culture from 11/7. Anticipate completion of antibiotics on 11/13, total of 8 days  4. Cocaine abuse, homelessness, and agitation/inappropriate behavior-improving on Valium and lorazepam, anticipate discontinuation of sitter, restraints today in preparation for discharge tomorrow - psychiatry consultation today , Dr Elsie SaasJonnalagadda has seen the patient yesterday, discussed patients current behaviour , avoid QT prolonging agents given COMP, start depakote and klonopin as he has recommended,   5. Paroxysmal atrial fibrillation - per Dr.  Herbie BaltimoreHarding, amiodarone 400mg  BID x 1 week then 400mg  daily for 1 week then 200mg  daily -hold off on anticoagulation per cardiology   Other chronic issues 1. Hypertension: Controlled. 2. Hypernatremia: Resolved 3. Hypokalemia: replace as needed and follow BMP. 4. Gastric ileus: Resolved and tolerating diet per nursing. 5. Normocytic anemia: Stable. 6. Post anoxic acute encephalopathy: seems confused. Requiring a sitter, restraints reviewed, psychiatry consultation today 7. S/p Fall 11/10: patient attempted to get off the chair and apparently tripped and sustained a fall. No reported injuries. Fall precautions and monitor. 8. Abnormal LFTs/transaminitis: Improving. ? Shock liver. Ultrasound abdomen shows gallbladder sludge without evidence of acute cholecystitis.   Code Status: Full  Family Communication: Discussed with daughter at bedside. Disposition Plan: anticipate discharge tomorrow, has 3 bed offers, psychiatry consultation today to determine capacity   SIGNIFICANT EVENTS/STUDIES: 11/05 Vfib arrest with 5- 15 minutes down time. DCCV X 4 reported 11/05 LHC: Inferior STEMI secondary to occluded distal RCA. Diffuse heavy calcification in the entire RCA with multiple segments of severe stenosis. Severe LV systolic dysfunction 11/05 CT head: NAD 11/06 TTE: LVEF 20-25%. AK of basal-midinferior myocardium. Grade 1 DD 11/06 EEG: mod-severe generalized slowing without epileptiform discharges 11/07 Passed SBT. Very agitated and not F/C on WUA. Extubated and no distress post extubation. dexmedetomidine gtt ordered 11/08 Has tolerated extubation. Cognition much improved. Abdominal exam abnormal/firm with diminished BS. High NGT output. Amylase/lipase/LFTs ordered 11/8 amio for fib 11/9 - high output from NGT  Consultants:  PCCM  Cardiology  Procedures: ETT 11/05 >> 11/07 L radial A line 11/05 >> 11/08 R femoral venous sheath 11/05 >> 11/07  Antibiotics: Ceftriaxone 11/07 >>  11/08 Pip-tazo 11/08 >>   Subjective: Patient more alert and oriented today, daughter by the bedside  Objective:  Filed Vitals:   11/02/14 2012 11/03/14 0559 11/03/14 0848 11/03/14 1035  BP: 157/84  136/74 165/80  Pulse: 72  69   Temp: 97.6 F (36.4 C)     TempSrc: Oral     Resp: 18     Height:      Weight:  66.951 kg (147 lb 9.6 oz)    SpO2: 100%       Intake/Output Summary (Last 24 hours) at 11/03/14 1127 Last data filed at 11/03/14 0559  Gross per 24 hour  Intake    240 ml  Output    125 ml  Net    115 ml   Filed Weights   10/31/14 2259 11/02/14 0450 11/03/14 0559  Weight: 67.994 kg (149 lb 14.4 oz) 64.683 kg (142 lb 9.6 oz) 66.951 kg (147 lb 9.6 oz)     Exam:  General: Well developed thin AAM in no acute distress. Head: Normocephalic, atraumatic, sclera non-icteric, no xanthomas, nares are without discharge. Neck: JVP not elevated. Lungs: Clear bilaterally to auscultation without wheezes, rales, or rhonchi. Breathing is unlabored. Heart: RRR S1 S2 without murmurs, rubs, or gallops.  Abdomen: Soft, non-tender, non-distended.  Extremities: No clubbing or cyanosis. No edema. Distal pedal pulses are 2+ and equal bilaterally. Neuro: Currently combative and requiring restraints but was cooperative with respiratory and heart exam.   Data Reviewed: Basic Metabolic Panel:  Recent Labs Lab 10/30/14 0350 10/31/14 0254 11/01/14 0417 11/02/14 0400 11/03/14 0307  NA 140 138 138 142 142  K 4.2 3.1* 3.6* 3.0* 3.7  CL 101 98 101 103 104  CO2 24 27 25 27 25   GLUCOSE 94 158* 95 117* 98  BUN 12 14 13 13 12   CREATININE 0.64 0.79 0.74 0.78 0.75  CALCIUM 9.5 8.9 8.9 9.0 8.9  MG 1.6  --   --  2.0  --    Liver Function Tests:  Recent Labs Lab 10/30/14 1410 10/31/14 0254 11/01/14 0417 11/02/14 0400 11/03/14 0307  AST 170* 161* 116* 74* 53*  ALT 67* 58* 54* 46 43  ALKPHOS 79 73 64 63 72  BILITOT 0.3 0.5 0.7 0.7 0.6  PROT 6.4 6.0 6.1 6.0 6.5  ALBUMIN 2.9* 2.8*  2.8* 2.8* 2.9*    Recent Labs Lab 10/30/14 1410  LIPASE 8*  AMYLASE 250*   No results for input(s): AMMONIA in the last 168 hours. CBC:  Recent Labs Lab 10/29/14 0541 10/30/14 0350 10/31/14 0254 10/31/14 1011 11/03/14 0307  WBC 9.1 14.2* 8.4 9.6 7.3  NEUTROABS  --   --   --  8.2*  --   HGB 13.1 12.6* 10.7* 11.3* 11.3*  HCT 39.0 37.2* 32.3* 33.6* 34.0*  MCV 85.2 84.9 84.6 86.6 85.0  PLT 207 200 172 175 236   Cardiac Enzymes:  Recent Labs Lab 10/27/14 1611 10/27/14 2145 10/28/14 0406  TROPONINI 2.30* 2.15* 1.53*   BNP (last 3 results) No results for input(s): PROBNP in the last 8760 hours. CBG:  Recent Labs Lab 11/01/14 0810 11/01/14 1127 11/01/14 1623 11/02/14 0252 11/02/14 2129  GLUCAP 89 96 91 129* 130*    Recent Results (from the past 240 hour(s))  Urine culture     Status: None   Collection Time: 10/27/14  4:35 PM  Result Value Ref Range Status   Specimen Description URINE, RANDOM  Final   Special Requests Normal  Final   Culture  Setup Time   Final    10/27/2014 21:50 Performed at [facility]  Colony Count NO GROWTH [facility]formed at [facility]   Final   Culture NO GROWTH [facility]formed at [facility]   Final   Report Status 10/28/2014 FINAL  Final  MRSA PCR Screening     Status: None   Collection Time: 10/27/14  4:35 PM  Result Value Ref Range Status   MRSA by PCR NEGATIVE NEGATIVE Final    Comment:        The GeneXpert MRSA Assay (FDA approved for NASAL specimens only), is one component of a comprehensive MRSA colonization surveillance program. It is not intended to diagnose MRSA infection nor to guide or monitor treatment for MRSA infections.   Culture, blood (routine x 2)     Status: None   Collection Time: 10/27/14  7:30 PM  Result Value Ref Range Status   Specimen Description BLOOD LEFT ARM  Final   Special Requests BOTTLES DRAWN AEROBIC AND ANAEROBIC 5 CC  Final   Culture  Setup Time   Final     10/28/2014 01:00 Performed at [facility]    Culture   Final    NO GROWTH 5 DAYS Performed at [facility]    Report Status 11/03/2014 FINAL  Final  Culture, respiratory (NON-Expectorated)     Status: None   Collection Time: 10/29/14  9:15 AM  Result Value Ref Range Status   Specimen Description TRACHEAL ASPIRATE  Final   Special Requests NONE  Final   Gram Stain   Final    ABUNDANT WBC PRESENT, PREDOMINANTLY PMN NO SQUAMOUS EPITHELIAL CELLS SEEN MODERATE GRAM POSITIVE COCCI IN PAIRS IN CHAINS IN CLUSTERS Performed at [facility]    Culture   Final    ABUNDANT STREPTOCOCCUS PNEUMONIAE Performed at [facility]    Report Status 11/01/2014 FINAL  Final   Organism ID, Bacteria STREPTOCOCCUS PNEUMONIAE  Final      Susceptibility   Streptococcus pneumoniae - MIC (ETEST)*    CEFTRIAXONE 0.38 SENSITIVE Sensitive     LEVOFLOXACIN 0.75 SENSITIVE Sensitive     PENICILLIN 0.75 INTERMEDIATE Intermediate     * ABUNDANT STREPTOCOCCUS PNEUMONIAE  Culture, Urine     Status: None   Collection Time: 10/29/14  8:16 PM  Result Value Ref Range Status   Specimen Description URINE, CATHETERIZED  Final   Special Requests NONE  Final   Culture  Setup Time   Final    10/29/2014 21:29 Performed at [facility]    Colony Count   Final    50,000 COLONIES/ML Performed at [facility]    Culture   Final    Multiple bacterial morphotypes present, none predominant. Suggest appropriate recollection if clinically indicated. Performed at [facility]    Report Status 10/31/2014 FINAL  Final  Culture, blood (routine x 2)     Status: None (Preliminary result)   Collection Time: 10/29/14  9:09 PM  Result Value Ref Range Status   Specimen Description BLOOD LEFT ANTECUBITAL  Final   Special Requests BOTTLES DRAWN AEROBIC ONLY 5CC  Final   Culture  Setup Time   Final    10/30/2014 00:58 Performed at [facility]    Culture   Final            BLOOD CULTURE RECEIVED NO GROWTH TO DATE CULTURE WILL BE HELD FOR 5 DAYS BEFORE ISSUING A FINAL NEGATIVE REPORT Performed at [facility]    Report Status PENDING  Incomplete  Culture, blood (routine x 2)  Status: None (Preliminary result)   Collection Time: 10/29/14  9:25 PM  Result Value Ref Range Status   Specimen Description BLOOD LEFT ANTECUBITAL  Final   Special Requests BOTTLES DRAWN AEROBIC ONLY 1CC  Final   Culture  Setup Time   Final    10/30/2014 00:58 Performed at [facility]    Culture   Final           BLOOD CULTURE RECEIVED NO GROWTH TO DATE CULTURE WILL BE HELD FOR 5 DAYS BEFORE ISSUING A FINAL NEGATIVE REPORT Performed at [facility]    Report Status PENDING  Incomplete  Clostridium Difficile by PCR     Status: None   Collection Time: 11/02/14  1:42 PM  Result Value Ref Range Status   C difficile by pcr NEGATIVE NEGATIVE Final        Studies: Dg Chest Port 1 View  11/01/2014   CLINICAL DATA:  Pulmonary edema  EXAM: PORTABLE CHEST - 1 VIEW  COMPARISON:  Chest radiograph 10/29/2014 and acute abdominal series 11 08/2014  FINDINGS: Cardiomegaly appears similar to recent prior studies. There is cephalization above the pulmonary vascularity. There is diffuse interstitial prominence bilaterally with mild perihilar and right basilar airspace disease. No visible pleural effusion on these portable AP views of the chest. Negative for pneumothorax. Thin linear sclerotic area associated with the anterior lateral left sixth rib is unchanged dating back to 10/27/2014 and could reflect remote posttraumatic changes.  IMPRESSION: Mild pulmonary edema pattern.   Electronically Signed   By: Britta Mccreedy M.D.   On: 11/01/2014 14:00        Scheduled Meds: . [START ON 11/08/2014] amiodarone  400 mg Oral Daily   Followed by  . amiodarone  400 mg Oral BID   Followed by  . [START ON 11/15/2014] amiodarone  200 mg Oral Daily  . antiseptic oral  rinse  7 mL Mouth Rinse q12n4p  . aspirin  81 mg Oral Daily  . atorvastatin  80 mg Oral q1800  . carvedilol  6.25 mg Oral BID WC  . cefTRIAXone (ROCEPHIN)  IV  1 g Intravenous Q24H  . chlorhexidine  15 mL Mouth Rinse BID  . enoxaparin (LOVENOX) injection  40 mg Subcutaneous Q24H  . lisinopril  2.5 mg Oral Daily  . LORazepam  1 mg Intravenous Once  . LORazepam  0.5 mg Oral BID  . pantoprazole  40 mg Oral Q1200   Continuous Infusions: . sodium chloride 5 mL/hr at 10/31/14 2000    Principal Problem:   Ventricular fibrillation - 2/2 Inferior STEMI Active Problems:   Inferior MI   Protein-calorie malnutrition, severe   Respiratory failure   STEMI (ST elevation myocardial infarction)   Hypoxic ischemic encephalopathy (HIE)   Abdominal pain   Hyperlipidemia with target LDL less than 70   Pre-diabetes   Transaminitis   Hypokalemia   Atrial fibrillation   Cocaine abuse   Chronic combined systolic and diastolic CHF (congestive heart failure)   Acute blood loss anemia   CAD S/P percutaneous coronary angioplasty - PTCA only of dRCA 100% for STEMI, heavily calcified vessel with extensive disease   Cardiac arrest    Time spent: 30 minutes.    Richarda Overlie, MD,   [facility] Pager [phone]  If 7PM-7AM, please contact night-coverage [email or web address] Password [email or web address] 11/03/2014, 11:27 AM    LOS: 7 days

## 2014-11-03 NOTE — Progress Notes (Signed)
Patient continues to try to leave the unit. He left with security following him and was found in the parking deck. Patient's other friend/family brought him clothes and shoes. Patient continues to be agitated after friends/family visit with him. MD made aware and orders received to put patient back in restraints. RN will continue to monitor. Bradley FerrisBrandie Marwah Disbro RN BSN 11/03/2014

## 2014-11-04 MED ORDER — ZIPRASIDONE MESYLATE 20 MG IM SOLR
10.0000 mg | INTRAMUSCULAR | Status: DC | PRN
  Filled 2014-11-04: qty 20

## 2014-11-04 MED ORDER — LISINOPRIL 2.5 MG PO TABS: 2.5000 mg | ORAL_TABLET | Freq: Every day | ORAL | Status: DC

## 2014-11-04 MED ORDER — QUETIAPINE FUMARATE 25 MG PO TABS: 25.0000 mg | ORAL_TABLET | Freq: Two times a day (BID) | ORAL | Status: DC

## 2014-11-04 MED ORDER — IPRATROPIUM-ALBUTEROL 0.5-2.5 (3) MG/3ML IN SOLN: 3.0000 mL | Freq: Four times a day (QID) | RESPIRATORY_TRACT | Status: DC | PRN

## 2014-11-04 MED ORDER — DIVALPROEX SODIUM ER 500 MG PO TB24: 500.0000 mg | ORAL_TABLET | Freq: Two times a day (BID) | ORAL | Status: DC

## 2014-11-04 MED ORDER — CARVEDILOL 6.25 MG PO TABS: 6.2500 mg | ORAL_TABLET | Freq: Two times a day (BID) | ORAL | Status: DC

## 2014-11-04 MED ORDER — ZIPRASIDONE MESYLATE 20 MG IM SOLR: 10.0000 mg | Freq: Four times a day (QID) | INTRAMUSCULAR | Status: DC | PRN

## 2014-11-04 MED ORDER — ASPIRIN 81 MG PO CHEW: 81.0000 mg | CHEWABLE_TABLET | Freq: Every day | ORAL | Status: DC

## 2014-11-04 MED ORDER — CLONAZEPAM 0.5 MG PO TABS: 0.5000 mg | ORAL_TABLET | Freq: Two times a day (BID) | ORAL | Status: DC

## 2014-11-04 MED ORDER — PANTOPRAZOLE SODIUM 40 MG PO TBEC: 40.0000 mg | DELAYED_RELEASE_TABLET | Freq: Every day | ORAL | Status: DC

## 2014-11-04 MED ORDER — QUETIAPINE FUMARATE 25 MG PO TABS
25.0000 mg | ORAL_TABLET | Freq: Two times a day (BID) | ORAL | Status: DC
  Administered 2014-11-04 – 2014-11-05 (×2): 25 mg via ORAL
  Filled 2014-11-04 (×4): qty 1

## 2014-11-04 MED ORDER — ATORVASTATIN CALCIUM 80 MG PO TABS: 80.0000 mg | ORAL_TABLET | Freq: Every day | ORAL | Status: DC

## 2014-11-04 MED ORDER — ZIPRASIDONE MESYLATE 20 MG IM SOLR: 10.0000 mg | INTRAMUSCULAR | Status: DC | PRN

## 2014-11-04 NOTE — Progress Notes (Signed)
Pt removed telemetry monitor and would not allow to have replaced.  Stated we had messed up his chest (burns from cardiac shocks) and was not messing with it any more. Pt resting with call bell within reach.  Will continue to monitor.

## 2014-11-04 NOTE — Clinical Social Work Note (Signed)
Spoke to patient to discuss going to a SNF for physical and occupational therapy to get his strength back return back home.  Patient agreed that he would go and did not have any questions.  [facility] for him to [facility] to tomorrow once he is medically ready, and discharge orders are given.  Permission was given by patient to speak to his son [name] about placement at SNF for rehab, contacted son, who is in agreement with patient going to facility.  Patient's son stated he can fill out paperwork tomorrow when he knows his dad will be discharging and weekend social work contact him.  Gave patient's son contact information for SNF and weekend social work if he has any questions.    Scott KnackEric R. Milo Bryant, MSW, [name] [phone] 11/04/2014 6:32 PM

## 2014-11-04 NOTE — Progress Notes (Signed)
During shift change patient attempting to untie posey belt and leave.  Pt refusing to get back in the bed and dialing 911.  Pt very agitated and verbally abusive to all staff. Multiple RNs attempt to calm patient and get him back in bed. Pt refused.  Security called and placed back in wrist restraints and posey belt.  Sitter at bedside.  Report given to day RN.

## 2014-11-04 NOTE — Progress Notes (Signed)
Pt is ambulating and off telemetry. N/A for CR services. Will sign off. Ethelda ChickKristan Babita Amaker CES, ACSM 10:51 AM 11/04/2014

## 2014-11-04 NOTE — Progress Notes (Signed)
Physical Therapy Treatment Patient Details Name: Smith MinceJames Flaim MRN: [id number] DOB: [birth year] Today's Date: 11/04/2014    History of Present Illness This 68 y.o. male admitted 10/27/14 after witnessed v-fib arrest in a homeless shelter.  Pt was down 5-10 mins prior to initiaion of CPR and debrilation - STEMI.  Pt extubated 10/29/14.  PMH:  a-Fib; cocaine abuse; chronic combined systolic and diastolic CHF; CAD    PT Comments    Patient progressing towards physical therapy goals. Continues to have decreased safety and deficit awareness. Focused on gait training and both static and dynamic balance activities today due to poor stability. SpO2 90-93% throughout therapy session while on room air and improves with cues for pursed lip breathing. Patient will continue to benefit from skilled physical therapy services to further improve independence with functional mobility.   Follow Up Recommendations  CIR     Equipment Recommendations  None recommended by PT    Recommendations for Other Services       Precautions / Restrictions Precautions Precautions: Fall Restrictions Weight Bearing Restrictions: No    Mobility  Bed Mobility                  Transfers Overall transfer level: Needs assistance Equipment used: None Transfers: Sit to/from Stand Sit to Stand: Min guard         General transfer comment: Min guard for safety. Moderately off balance but able to self correct.  Ambulation/Gait Ambulation/Gait assistance: Min guard Ambulation Distance (Feet): 400 Feet Assistive device: None Gait Pattern/deviations: Staggering left;Staggering right;Drifts right/left;Narrow base of support;Ataxic;Scissoring;Step-through pattern     General Gait Details: VC to widen base of support with good response. Pt staggers left and right but is able to self correct. Min guard should be provided at all times due to poor balance and recent history of fall. Practiced dynamic gait challenges  such as stepping backwards and high marching.   Stairs            Wheelchair Mobility    Modified Rankin (Stroke Patients Only)       Balance                                    Cognition Arousal/Alertness: Awake/alert Behavior During Therapy: Restless;Impulsive Overall Cognitive Status: Impaired/Different from baseline Area of Impairment: Attention;Following commands;Safety/judgement;Awareness;Problem solving   Current Attention Level: Alternating   Following Commands: Follows one step commands consistently Safety/Judgement: Decreased awareness of safety;Decreased awareness of deficits Awareness: Intellectual Problem Solving: Slow processing;Difficulty sequencing;Requires verbal cues      Exercises Other Exercises Other Exercises: Static standing balance activities. Single leg stance, tandem stance (eyes open and closed) and rhomberg. Each position required physical assist and was only able to hold <30 sec each position.    General Comments        Pertinent Vitals/Pain Pain Assessment: No/denies pain    Home Living                      Prior Function            PT Goals (current goals can now be found in the care plan section) Progress towards PT goals: Progressing toward goals    Frequency  Min 3X/week    PT Plan Current plan remains appropriate    Co-evaluation             End of Session Equipment Utilized During Treatment:  Gait belt Activity Tolerance: Patient tolerated treatment well Patient left: in chair;with call bell/phone within reach     Time: 1605-1620 PT Time Calculation (min) (ACUTE ONLY): 15 min  Charges:  $Gait Training: 8-22 mins                    G Codes:      [place], Siyana Erney S 11/04/2014, 5:12 PM  Sunday SpillersLogan Secor [place], [place] [phone]

## 2014-11-04 NOTE — Consult Note (Addendum)
Psychiatry Consult Follow up Note  Reason for Consult:  Cocaine abuse Referring Physician:  Dr. Fletcher [name] is an 68 y.o. male. Total Time spent with patient: 30 minutes  Assessment: AXIS I:  Substance Induced Mood Disorder and Cocaine abuse AXIS II:  Deferred AXIS III:   Past Medical History  Diagnosis Date  . Smoker    AXIS IV:  economic problems, housing problems, occupational problems, other psychosocial or environmental problems, problems related to social environment and problems with primary support group AXIS V:  51-60 moderate symptoms  Plan: Case discussed with Dr. Allyson Bryant and staff RN and LCSW Discontinue sitter Continue Depakote 500 mg PO BID for agitation and aggression Continue Clonazepam 0.5 mg PO BID for anxiety Will start Seroquel 25 mg PO BID if cooperative with oral intake  Start Geodon 10 mg IM PO Q2H / PRN for aggression and combative behaviors No evidence of imminent risk to self or others at present.   Supportive therapy provided about ongoing stressors. monitor closely for abnonamal behaviors for this weekend.  Refer to psych social service for appropriate SNF or ALF Appreciate psychiatric consultation and follow up as clinically required Please contact [phone] or [phone] if needs further assistance   Subjective:   [name] is a 67 y.o. male patient admitted with Vfib and STEMI.  HPI:  Patient seen and chart reviewed. Patient appeared in his room with restraints to his upper extremities due to increased confusion, agitation and paranoid delusions. Patient is a poor historian, but awake, alert and poorly oriented. Patient daughter who is at bed side stated that he needs placement because of his current mental state and feels won;t be able to care for him at home.   Interval history: Patient appeared in his room, sitting in a chair next to his bed, calm and cooperative. He is less confused, agitated and paranoid today. Patient son is near  his room and concerns about his safety due to drugs available around him and not a safe place to be. Patient is willing to be placed out side home at this time. He has been agitated and combative yesterday when staff trying to keep him safe in hospital. He was eloped and security brings him back yesterday. Patient minimizes his behaviors and drug use. Patient has limited understanding about his heart condition. He is willing to stay in his bed without safety sitter and out of home placement.   Medical history:  68 year old male with history of cocaine abuse, found down for 5-10 minutes and in Vfib arrest in setting of inferior STEMI. He was resuscitated and underwent emergent cardiac catheterization on 10/27/14 (occluded distal RCA). Echo on 10/28/14 showed EF 20-25 percent with grade 1 diastolic dysfunction. Patient was in ICU under PCCM care and was transferred to floor on 11/01/14 under cardiology service with [facility] providing consultation. Patient urin drug screen is positive of cocaine and benzo's.   HPI Elements:  Location:  confusion and substance abuse. Quality:  poor'. Severity:  acute. Timing:  unknown.  Past Psychiatric History: Past Medical History  Diagnosis Date  . Smoker     reports that he has been smoking Cigarettes.  He has been smoking about 0.00 packs per day. He does not have any smokeless tobacco history on file. His alcohol and drug histories are not on file. No family history on file.   Living Arrangements: Non-relatives/Friends   Abuse/Neglect [facility]) Physical Abuse: Denies Verbal Abuse: Denies Sexual Abuse: Denies Allergies:  No  Known Allergies  ACT Assessment Complete:  NO Objective: Blood pressure 112/72, pulse 59, temperature 98.2 F (36.8 C), temperature source Oral, resp. rate 17, height $RemoveBe'6\' 1"'DuOPfqIJd$  (1.854 m), weight 68.1 kg (150 lb 2.1 oz), SpO2 98 %.Body mass index is 19.81 kg/(m^2). Results for orders placed or performed during the hospital encounter of 10/27/14  (from the past 72 hour(s))  Glucose, capillary     Status: None   Collection Time: 11/01/14 11:27 AM  Result Value Ref Range   Glucose-Capillary 96 70 - 99 mg/dL  Glucose, capillary     Status: None   Collection Time: 11/01/14  4:23 PM  Result Value Ref Range   Glucose-Capillary 91 70 - 99 mg/dL  Glucose, capillary     Status: Abnormal   Collection Time: 11/02/14  2:52 AM  Result Value Ref Range   Glucose-Capillary 129 (H) 70 - 99 mg/dL  Comprehensive metabolic panel     Status: Abnormal   Collection Time: 11/02/14  4:00 AM  Result Value Ref Range   Sodium 142 137 - 147 mEq/L   Potassium 3.0 (L) 3.7 - 5.3 mEq/L   Chloride 103 96 - 112 mEq/L   CO2 27 19 - 32 mEq/L   Glucose, Bld 117 (H) 70 - 99 mg/dL   BUN 13 6 - 23 mg/dL   Creatinine, Ser 0.78 0.50 - 1.35 mg/dL   Calcium 9.0 8.4 - 10.5 mg/dL   Total Protein 6.0 6.0 - 8.3 g/dL   Albumin 2.8 (L) 3.5 - 5.2 g/dL   AST 74 (H) 0 - 37 U/L   ALT 46 0 - 53 U/L   Alkaline Phosphatase 63 39 - 117 U/L   Total Bilirubin 0.7 0.3 - 1.2 mg/dL   GFR calc non Af Amer >90 >90 mL/min   GFR calc Af Amer >90 >90 mL/min    Comment: (NOTE) The eGFR has been calculated using the CKD EPI equation. This calculation has not been validated in all clinical situations. eGFR's persistently <90 mL/min signify possible Chronic Kidney Disease.    Anion gap 12 5 - 15  Magnesium     Status: None   Collection Time: 11/02/14  4:00 AM  Result Value Ref Range   Magnesium 2.0 1.5 - 2.5 mg/dL  Clostridium Difficile by PCR     Status: None   Collection Time: 11/02/14  1:42 PM  Result Value Ref Range   C difficile by pcr NEGATIVE NEGATIVE  Glucose, capillary     Status: Abnormal   Collection Time: 11/02/14  9:29 PM  Result Value Ref Range   Glucose-Capillary 130 (H) 70 - 99 mg/dL  Comprehensive metabolic panel     Status: Abnormal   Collection Time: 11/03/14  3:07 AM  Result Value Ref Range   Sodium 142 137 - 147 mEq/L   Potassium 3.7 3.7 - 5.3 mEq/L     Comment: DELTA CHECK NOTED NO VISIBLE HEMOLYSIS    Chloride 104 96 - 112 mEq/L   CO2 25 19 - 32 mEq/L   Glucose, Bld 98 70 - 99 mg/dL   BUN 12 6 - 23 mg/dL   Creatinine, Ser 0.75 0.50 - 1.35 mg/dL   Calcium 8.9 8.4 - 10.5 mg/dL   Total Protein 6.5 6.0 - 8.3 g/dL   Albumin 2.9 (L) 3.5 - 5.2 g/dL   AST 53 (H) 0 - 37 U/L   ALT 43 0 - 53 U/L   Alkaline Phosphatase 72 39 - 117 U/L   Total Bilirubin  0.6 0.3 - 1.2 mg/dL   GFR calc non Af Amer >90 >90 mL/min   GFR calc Af Amer >90 >90 mL/min    Comment: (NOTE) The eGFR has been calculated using the CKD EPI equation. This calculation has not been validated in all clinical situations. eGFR's persistently <90 mL/min signify possible Chronic Kidney Disease.    Anion gap 13 5 - 15  CBC     Status: Abnormal   Collection Time: 11/03/14  3:07 AM  Result Value Ref Range   WBC 7.3 4.0 - 10.5 K/uL   RBC 4.00 (L) 4.22 - 5.81 MIL/uL   Hemoglobin 11.3 (L) 13.0 - 17.0 g/dL   HCT 34.0 (L) 39.0 - 52.0 %   MCV 85.0 78.0 - 100.0 fL   MCH 28.3 26.0 - 34.0 pg   MCHC 33.2 30.0 - 36.0 g/dL   RDW 13.8 11.5 - 15.5 %   Platelets 236 150 - 400 K/uL   Labs are reviewed.  Current Facility-Administered Medications  Medication Dose Route Frequency Provider Last Rate Last Dose  . 0.9 %  sodium chloride infusion   Intravenous Continuous Wilhelmina Mcardle, MD 5 mL/hr at 10/31/14 2000    . [START ON 11/08/2014] amiodarone (PACERONE) tablet 400 mg  400 mg Oral Daily Dayna N Dunn, PA-C       Followed by  . amiodarone (PACERONE) tablet 400 mg  400 mg Oral BID Dayna N Dunn, PA-C   400 mg at 11/03/14 2130   Followed by  . [START ON 11/15/2014] amiodarone (PACERONE) tablet 200 mg  200 mg Oral Daily Dayna N Dunn, PA-C      . antiseptic oral rinse (CPC / CETYLPYRIDINIUM CHLORIDE 0.05%) solution 7 mL  7 mL Mouth Rinse q12n4p Candee Furbish, MD   7 mL at 11/03/14 1250  . aspirin chewable tablet 81 mg  81 mg Oral Daily Almyra Deforest, [place]   81 mg at 11/03/14 1035  . atorvastatin  (LIPITOR) tablet 80 mg  80 mg Oral q1800 Wilhelmina Mcardle, MD   80 mg at 11/03/14 1827  . bisacodyl (DULCOLAX) EC tablet 5 mg  5 mg Oral Daily PRN Raylene Miyamoto, MD      . carvedilol (COREG) tablet 6.25 mg  6.25 mg Oral BID WC Almyra Deforest, PA   6.25 mg at 11/03/14 1827  . cefTRIAXone (ROCEPHIN) 1 g in dextrose 5 % 50 mL IVPB - Premix  1 g Intravenous Q24H Candee Furbish, MD   1 g at 11/03/14 1249  . chlorhexidine (PERIDEX) 0.12 % solution 15 mL  15 mL Mouth Rinse BID Candee Furbish, MD   15 mL at 11/03/14 2135  . clonazePAM (KLONOPIN) tablet 0.5 mg  0.5 mg Oral BID Reyne Dumas, MD   0.5 mg at 11/03/14 2130  . diazepam (VALIUM) injection 2 mg  2 mg Intravenous Q4H PRN Reyne Dumas, MD   2 mg at 11/03/14 1425  . divalproex (DEPAKOTE ER) 24 hr tablet 500 mg  500 mg Oral BID Reyne Dumas, MD   500 mg at 11/03/14 2130  . enoxaparin (LOVENOX) injection 40 mg  40 mg Subcutaneous Q24H Wilhelmina Mcardle, MD   40 mg at 11/02/14 2120  . fentaNYL (SUBLIMAZE) injection 25-100 mcg  25-100 mcg Intravenous Q2H PRN Wilhelmina Mcardle, MD   100 mcg at 11/03/14 0618  . hydrALAZINE (APRESOLINE) injection 10 mg  10 mg Intravenous Q4H PRN Chesley Mires, MD   10 mg at 10/30/14 0314  . ipratropium-albuterol (  DUONEB) 0.5-2.5 (3) MG/3ML nebulizer solution 3 mL  3 mL Nebulization Q6H PRN Candee Furbish, MD      . lisinopril (PRINIVIL,ZESTRIL) tablet 2.5 mg  2.5 mg Oral Daily Almyra Deforest, PA   2.5 mg at 11/03/14 1036  . LORazepam (ATIVAN) injection 1 mg  1 mg Intravenous Once Jeryl Columbia, NP      . LORazepam (ATIVAN) tablet 0.5 mg  0.5 mg Oral BID Rhetta Mura Schorr, NP   0.5 mg at 11/03/14 2130  . metoprolol (LOPRESSOR) injection 2.5-5 mg  2.5-5 mg Intravenous Q3H PRN Wilhelmina Mcardle, MD      . pantoprazole (PROTONIX) EC tablet 40 mg  40 mg Oral Q1200 Candee Furbish, MD   40 mg at 11/03/14 1249    Psychiatric Specialty Exam: Physical Exam as per history and physical  Review of Systems  Constitutional: Positive for malaise/fatigue.   Psychiatric/Behavioral: Positive for depression and substance abuse. The patient is nervous/anxious.     Blood pressure 112/72, pulse 59, temperature 98.2 F (36.8 C), temperature source Oral, resp. rate 17, height $RemoveBe'6\' 1"'FRWfLOAwk$  (1.854 m), weight 68.1 kg (150 lb 2.1 oz), SpO2 98 %.Body mass index is 19.81 kg/(m^2).  General Appearance: Guarded  Eye Contact::  Fair  Speech:  Blocked, Garbled and Slurred  Volume:  Decreased  Mood:  Angry, Anxious and Irritable  Affect:  Inappropriate and Labile  Thought Process:  Disorganized and Loose  Orientation:  Full (Time, Place, and Person)  Thought Content:  Delusions and Paranoid Ideation  Suicidal Thoughts:  No  Homicidal Thoughts:  No  Memory:  Immediate;   Poor Recent;   Poor  Judgement:  Impaired  Insight:  Lacking  Psychomotor Activity:  Increased  Concentration:  Poor  Recall:  [place] of Knowledge:Poor  Language: Fair  Akathisia:  NA  Handed:  Right  AIMS (if indicated):     Assets:  Others:  unknown  Sleep:      Musculoskeletal: Strength & Muscle Tone: increased Gait & Station: unable to stand Patient leans: N/A  Treatment Plan Summary: Daily contact with patient to assess and evaluate symptoms and progress in treatment Medication management Patient has no capacity to make his own Medical decision as he has no understanding about his medical condition and required treatment   Miabella Shannahan,JANARDHAHA R. 11/04/2014 9:09 AM

## 2014-11-04 NOTE — Progress Notes (Signed)
Pt understands that sitter has been discontinued and he will need to be cooperative and follow the rules in order to be ready for discharge tomorrow.  He understands that he will not be able to go home. He understands that he must first go to skilled nursing for rehab. Pt resting with call bell within reach.  Will continue to monitor. Thomas HoffBurton, Lennan Malone McClintock, RN

## 2014-11-04 NOTE — Progress Notes (Signed)
Pt removed IV and was upset that he "was accused" of taking it out. He stated he didn't touch it and we must have taken it out. He said he didn't need one. Pt resting with call bell within reach.  Will continue to monitor.

## 2014-11-04 NOTE — Progress Notes (Addendum)
PROGRESS NOTE    [name] ZOX:[id number] DOB: 1946-03-04 DOA: 10/27/2014 PCP: No primary care provider on file.  HPI/Brief narrative 68 year old male with history of cocaine abuse, found down for 5-10 minutes and in Vfib arrest in setting of inferior STEMI. He was resuscitated and underwent emergent cardiac catheterization on 10/27/14 (occluded distal RCA). Echo on 10/28/14 showed EF 20-25 percent with grade 1 diastolic dysfunction. Patient was in ICU under PCCM care and was transferred to floor on 11/01/14 under cardiology service with [facility] providing consultation.[facility]   Assessment/Plan:  1.  VF arrest in the setting of inferior STEMI/CAD - s/p PTCA with balloon angioplasty only of mid and distal RCA. Dr. Eden EmmsNishan d/w Dr. Clifton JamesMcAlhany yesterday - given homelessness and diffuse disease would not rx proximal and mid RCA - medical therapy - continue Brilinta, aspirin, lipitor, Coreg (will need to abstain from cocaine) - with his anoxic encephalopathy, agitation/noncompliance, and poor social situation he is unlikely to be a candidate for lifevest Patient not wanting to keep his telemetry leads on. Therefore discontinue telemetry  2. 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 Started on lisinopril 2.5 mg. Not a candidate for AICD despite ischemic dilated cardiomyopathy.   3.   Early HCAP Vs Aspiration PNA (strep): change IV Zosyn to Rocephin based on sputum culture from 11/7. Anticipate completion of antibiotics on 11/13, total of 8 days  4. Cocaine abuse, homelessness, and agitation/inappropriate behavior-improving on Valium discontinue lorazepam, we had to replace sitter because of agitation last night, no longer in restraints - psychiatry consultation today , Dr Elsie SaasJonnalagadda has seen the patient , discussed patients current behaviour , avoid QT prolonging agents given cardiomyopathy, started on  depakote and klonopin as he has recommended, he also recommend seroquel and geodon. Psychiatry recommends discharge to SNF when stable. No indication for inpatient psychiatry admission  5. Paroxysmal atrial fibrillation - per Dr. Herbie BaltimoreHarding, amiodarone 400mg  BID x 1 week then 400mg  daily for 1 week then 200mg  daily -hold off on anticoagulation per cardiology   Other chronic issues 1. Hypertension: Controlled. 2. Hypernatremia: Resolved 3. Hypokalemia: replace as needed and follow BMP. 4. Gastric ileus: Resolved and tolerating diet per nursing. 5. Normocytic anemia: Stable. 6. Post anoxic acute encephalopathy: seems confused. Requiring a sitter, restraints reviewed, psychiatry consultation today 7. S/p Fall 11/10: patient attempted to get off the chair and apparently tripped and sustained a fall. No reported injuries. Fall precautions and monitor. 8. Abnormal LFTs/transaminitis: Improving. ? Shock liver. Ultrasound abdomen shows gallbladder sludge without evidence of acute cholecystitis.   Code Status: Full  Family Communication: Discussed with daughter at bedside. Disposition Plan: anticipate discharge tomorrow, has 3 bed offers, psychiatry consultation today to determine capacity   SIGNIFICANT EVENTS/STUDIES: 11/05 Vfib arrest with 5- 15 minutes down time. DCCV X 4 reported 11/05 LHC: Inferior STEMI secondary to occluded distal RCA. Diffuse heavy calcification in the entire RCA with multiple segments of severe stenosis. Severe LV systolic dysfunction 11/05 CT head: NAD 11/06 TTE: LVEF 20-25%. AK of basal-midinferior myocardium. Grade 1 DD 11/06 EEG: mod-severe generalized slowing without epileptiform discharges 11/07 Passed SBT. Very agitated and not F/C on WUA. Extubated and no distress post extubation. dexmedetomidine gtt ordered 11/08 Has tolerated extubation. Cognition much improved. Abdominal exam abnormal/firm with diminished BS. High NGT output. Amylase/lipase/LFTs ordered 11/8  amio for fib 11/9 - high output from NGT  Consultants:  PCCM  Cardiology  Procedures: ETT 11/05 >> 11/07 L radial A line  11/05 >> 11/08 R femoral venous sheath 11/05 >> 11/07  Antibiotics: Ceftriaxone 11/07 >> 11/08 Pip-tazo 11/08 >> 11/13  Subjective: Pt very agitated and verbally abusive to all staff. Multiple RNs attempt to calm patient and get him back in bed. Pt refused  Objective: Filed Vitals:   11/03/14 1947 11/03/14 2130 11/04/14 0538 11/04/14 0639  BP: 164/100 142/83 112/72   Pulse: 65 61 59   Temp: 98.5 F (36.9 C)  98.2 F (36.8 C)   TempSrc: Oral  Oral   Resp: 18  17   Height:      Weight:    68.1 kg (150 lb 2.1 oz)  SpO2: 100%  98%     Intake/Output Summary (Last 24 hours) at 11/04/14 1053 Last data filed at 11/04/14 0930  Gross per 24 hour  Intake    480 ml  Output      0 ml  Net    480 ml   Filed Weights   11/02/14 0450 11/03/14 0559 11/04/14 0639  Weight: 64.683 kg (142 lb 9.6 oz) 66.951 kg (147 lb 9.6 oz) 68.1 kg (150 lb 2.1 oz)     Exam:  General: Well developed thin AAM in no acute distress. Head: Normocephalic, atraumatic, sclera non-icteric, no xanthomas, nares are without discharge. Neck: JVP not elevated. Lungs: Clear bilaterally to auscultation without wheezes, rales, or rhonchi. Breathing is unlabored. Heart: RRR S1 S2 without murmurs, rubs, or gallops.  Abdomen: Soft, non-tender, non-distended.  Extremities: No clubbing or cyanosis. No edema. Distal pedal pulses are 2+ and equal bilaterally. Neuro: Currently combative and requiring restraints but was cooperative with respiratory and heart exam.   Data Reviewed: Basic Metabolic Panel:  Recent Labs Lab 10/30/14 0350 10/31/14 0254 11/01/14 0417 11/02/14 0400 11/03/14 0307  NA 140 138 138 142 142  K 4.2 3.1* 3.6* 3.0* 3.7  CL 101 98 101 103 104  CO2 24 27 25 27 25   GLUCOSE 94 158* 95 117* 98  BUN 12 14 13 13 12   CREATININE 0.64 0.79 0.74 0.78 0.75  CALCIUM 9.5 8.9  8.9 9.0 8.9  MG 1.6  --   --  2.0  --    Liver Function Tests:  Recent Labs Lab 10/30/14 1410 10/31/14 0254 11/01/14 0417 11/02/14 0400 11/03/14 0307  AST 170* 161* 116* 74* 53*  ALT 67* 58* 54* 46 43  ALKPHOS 79 73 64 63 72  BILITOT 0.3 0.5 0.7 0.7 0.6  PROT 6.4 6.0 6.1 6.0 6.5  ALBUMIN 2.9* 2.8* 2.8* 2.8* 2.9*    Recent Labs Lab 10/30/14 1410  LIPASE 8*  AMYLASE 250*   No results for input(s): AMMONIA in the last 168 hours. CBC:  Recent Labs Lab 10/29/14 0541 10/30/14 0350 10/31/14 0254 10/31/14 1011 11/03/14 0307  WBC 9.1 14.2* 8.4 9.6 7.3  NEUTROABS  --   --   --  8.2*  --   HGB 13.1 12.6* 10.7* 11.3* 11.3*  HCT 39.0 37.2* 32.3* 33.6* 34.0*  MCV 85.2 84.9 84.6 86.6 85.0  PLT 207 200 172 175 236   Cardiac Enzymes: No results for input(s): CKTOTAL, CKMB, CKMBINDEX, TROPONINI in the last 168 hours. BNP (last 3 results) No results for input(s): PROBNP in the last 8760 hours. CBG:  Recent Labs Lab 11/01/14 0810 11/01/14 1127 11/01/14 1623 11/02/14 0252 11/02/14 2129  GLUCAP 89 96 91 129* 130*    Recent Results (from the past 240 hour(s))  Urine culture     Status: None   Collection  Time: 10/27/14  4:35 PM  Result Value Ref Range Status   Specimen Description URINE, RANDOM  Final   Special Requests Normal  Final   Culture  Setup Time   Final    10/27/2014 21:50 Performed at [facility]    Colony Count NO GROWTH Performed at [facility]   Final   Culture NO GROWTH Performed at [facility]   Final   Report Status 10/28/2014 FINAL  Final  MRSA PCR Screening     Status: None   Collection Time: 10/27/14  4:35 PM  Result Value Ref Range Status   MRSA by PCR NEGATIVE NEGATIVE Final    Comment:        The GeneXpert MRSA Assay (FDA approved for NASAL specimens only), is one component of a comprehensive MRSA colonization surveillance program. It is not intended to diagnose MRSA infection nor to guide or monitor  treatment for MRSA infections.   Culture, blood (routine x 2)     Status: None   Collection Time: 10/27/14  7:30 PM  Result Value Ref Range Status   Specimen Description BLOOD LEFT ARM  Final   Special Requests BOTTLES DRAWN AEROBIC AND ANAEROBIC 5 CC  Final   Culture  Setup Time   Final    10/28/2014 01:00 Performed at [facility]    Culture   Final    NO GROWTH 5 DAYS Performed at [facility]    Report Status 11/03/2014 FINAL  Final  Culture, respiratory (NON-Expectorated)     Status: None   Collection Time: 10/29/14  9:15 AM  Result Value Ref Range Status   Specimen Description TRACHEAL ASPIRATE  Final   Special Requests NONE  Final   Gram Stain   Final    ABUNDANT WBC PRESENT, PREDOMINANTLY PMN NO SQUAMOUS EPITHELIAL CELLS SEEN MODERATE GRAM POSITIVE COCCI IN PAIRS IN CHAINS IN CLUSTERS Performed at [facility]    Culture   Final    ABUNDANT STREPTOCOCCUS PNEUMONIAE Performed at [facility]    Report Status 11/01/2014 FINAL  Final   Organism ID, Bacteria STREPTOCOCCUS PNEUMONIAE  Final      Susceptibility   Streptococcus pneumoniae - MIC (ETEST)*    CEFTRIAXONE 0.38 SENSITIVE Sensitive     LEVOFLOXACIN 0.75 SENSITIVE Sensitive     PENICILLIN 0.75 INTERMEDIATE Intermediate     * ABUNDANT STREPTOCOCCUS PNEUMONIAE  Culture, Urine     Status: None   Collection Time: 10/29/14  8:16 PM  Result Value Ref Range Status   Specimen Description URINE, CATHETERIZED  Final   Special Requests NONE  Final   Culture  Setup Time   Final    10/29/2014 21:29 Performed at [facility] Count   Final    50,000 COLONIES/ML Performed at [facility]    Culture   Final    Multiple bacterial morphotypes present, none predominant. Suggest appropriate recollection if clinically indicated. Performed at [facility]    Report Status 10/31/2014 FINAL  Final  Culture, blood (routine x 2)     Status: None (Preliminary  result)   Collection Time: 10/29/14  9:09 PM  Result Value Ref Range Status   Specimen Description BLOOD LEFT ANTECUBITAL  Final   Special Requests BOTTLES DRAWN AEROBIC ONLY 5CC  Final   Culture  Setup Time   Final    10/30/2014 00:58 Performed at [facility]    Culture   Final  BLOOD CULTURE RECEIVED NO GROWTH TO DATE CULTURE WILL BE HELD FOR 5 DAYS BEFORE ISSUING A FINAL NEGATIVE REPORT [facility]    Report Status PENDING  Incomplete  Culture, blood (routine x 2)     Status: None (Preliminary result)   Collection Time: 10/29/14  9:25 PM  Result Value Ref Range Status   Specimen Description BLOOD LEFT ANTECUBITAL  Final   Special Requests BOTTLES DRAWN AEROBIC ONLY 1CC  Final   Culture  Setup Time   Final    10/30/2014 00:58 [facility]formed at [facility]    Culture   Final           BLOOD CULTURE RECEIVED NO GROWTH TO DATE CULTURE WILL BE HELD FOR 5 DAYS BEFORE ISSUING A FINAL NEGATIVE REPORT [facility]formed at [facility]    Report Status PENDING  Incomplete  Clostridium Difficile by PCR     Status: None   Collection Time: 11/02/14  1:42 PM  Result Value Ref Range Status   C difficile by pcr NEGATIVE NEGATIVE Final        Studies: No results found.      Scheduled Meds: . [START ON 11/08/2014] amiodarone  400 mg Oral Daily   Followed by  . amiodarone  400 mg Oral BID   Followed by  . [START ON 11/15/2014] amiodarone  200 mg Oral Daily  . antiseptic oral rinse  7 mL Mouth Rinse q12n4p  . aspirin  81 mg Oral Daily  . atorvastatin  80 mg Oral q1800  . carvedilol  6.25 mg Oral BID WC  . chlorhexidine  15 mL Mouth Rinse BID  . clonazePAM  0.5 mg Oral BID  . divalproex  500 mg Oral BID  . enoxaparin (LOVENOX) injection  40 mg Subcutaneous Q24H  . lisinopril  2.5 mg Oral Daily  . LORazepam  1 mg Intravenous Once  . pantoprazole  40 mg Oral Q1200   Continuous Infusions: . sodium chloride 5 mL/hr at 10/31/14 2000      Principal Problem:   Ventricular fibrillation - 2/2 Inferior STEMI Active Problems:   Inferior MI   Protein-calorie malnutrition, severe   Respiratory failure   STEMI (ST elevation myocardial infarction)   Hypoxic ischemic encephalopathy (HIE)   Abdominal pain   Hyperlipidemia with target LDL less than 70   Pre-diabetes   Transaminitis   Hypokalemia   Atrial fibrillation   Cocaine abuse   Chronic combined systolic and diastolic CHF (congestive heart failure)   Acute blood loss anemia   CAD S/P percutaneous coronary angioplasty - PTCA only of dRCA 100% for STEMI, heavily calcified vessel with extensive disease   Cardiac arrest    Time spent: 30 minutes.    Richarda OverlieABROL,Jalaysha Skilton, MD,   [facility] Pager [phone]  If 7PM-7AM, please contact night-coverage [email or web address] Password [email or web address] 11/04/2014, 10:53 AM    LOS: 8 days

## 2014-11-04 NOTE — Progress Notes (Addendum)
Rn to assess patient pain level multiple times and pt complains of pain but refuses to take any medication at this time, stating he "does not want anything else in his body". Pt continues to be verbally aggressive towards staff.  Will continue to monitor pt closely.  Sitter at bedside.  Pt resting in bed in no distress with call bell in reach.   0540  Pt pain assessed again, pt becomes very angry when asked about his pain.  Pt states he has been hurting all night but once again refuses to take anything for pain.  Pt again verbally aggressive to staff.  Will continue to monitor pt closely.  Sitter at bedside.  Pt resting in bed in no distress at time.

## 2014-11-04 NOTE — Progress Notes (Signed)
Trac phone given to Jannifer HickJeff Staley for sister Nani SkillernKeisha Harrison. Thomas HoffBurton, Maykel Reitter McClintock, RN

## 2014-11-04 NOTE — Progress Notes (Signed)
Patient ID: [name], male   DOB: [birth year], 68 y.o.   MRN: [id number]  Patient: [name] / Admit Date: 10/27/2014 / Date of Encounter: 11/04/2014, 11:59 AM   Subjective: No complaints less combative but still needed sitter last night   Objective: Telemetry: NSR Physical Exam: Blood pressure 112/72, pulse 59, temperature 98.2 F (36.8 C), temperature source Oral, resp. rate 17, height 6\' 1"  (1.854 m), weight 68.1 kg (150 lb 2.1 oz), SpO2 98 %. General: Well developed thin AAM in no acute distress. Head: Normocephalic, atraumatic, sclera non-icteric, no xanthomas, nares are without discharge. Neck: JVP not elevated. Lungs: Clear bilaterally to auscultation without wheezes, rales, or rhonchi. Breathing is unlabored. Heart: RRR S1 S2 without murmurs, rubs, or gallops.  Abdomen: Soft, non-tender, non-distended.  Extremities: No clubbing or cyanosis. No edema. Distal pedal pulses are 2+ and equal bilaterally. Neuro: Currently combative and requiring restraints but was cooperative with respiratory and heart exam   Intake/Output Summary (Last 24 hours) at 11/04/14 1159 Last data filed at 11/04/14 0930  Gross per 24 hour  Intake    480 ml  Output      0 ml  Net    480 ml    Inpatient Medications:  . [START ON 11/08/2014] amiodarone  400 mg Oral Daily   Followed by  . amiodarone  400 mg Oral BID   Followed by  . [START ON 11/15/2014] amiodarone  200 mg Oral Daily  . antiseptic oral rinse  7 mL Mouth Rinse q12n4p  . aspirin  81 mg Oral Daily  . atorvastatin  80 mg Oral q1800  . carvedilol  6.25 mg Oral BID WC  . chlorhexidine  15 mL Mouth Rinse BID  . clonazePAM  0.5 mg Oral BID  . divalproex  500 mg Oral BID  . enoxaparin (LOVENOX) injection  40 mg Subcutaneous Q24H  . lisinopril  2.5 mg Oral Daily  . LORazepam  1 mg Intravenous Once  . pantoprazole  40 mg Oral Q1200  . QUEtiapine  25 mg Oral BID   Infusions:  . sodium chloride 5 mL/hr at 10/31/14 2000     Labs:  Recent Labs  11/02/14 0400 11/03/14 0307  NA 142 142  K 3.0* 3.7  CL 103 104  CO2 27 25  GLUCOSE 117* 98  BUN 13 12  CREATININE 0.78 0.75  CALCIUM 9.0 8.9  MG 2.0  --     Recent Labs  11/02/14 0400 11/03/14 0307  AST 74* 53*  ALT 46 43  ALKPHOS 63 72  BILITOT 0.7 0.6  PROT 6.0 6.5  ALBUMIN 2.8* 2.9*    Recent Labs  11/03/14 0307  WBC 7.3  HGB 11.3*  HCT 34.0*  MCV 85.0  PLT 236     Radiology/Studies:  Ct Head Wo Contrast  10/27/2014   CLINICAL DATA:  Cardiac arrest.  EXAM: CT HEAD WITHOUT CONTRAST  TECHNIQUE: Contiguous axial images were obtained from the base of the skull through the vertex without intravenous contrast.  COMPARISON:  None.  FINDINGS: Bony calvarium appears intact. No mass effect or midline shift is noted. Ventricular size is within normal limits. There is no evidence of mass lesion, hemorrhage or acute infarction.  IMPRESSION: Normal head CT.   Electronically Signed   By: Roque Lias M.D.   On: 10/27/2014 16:04   US Abdomen Complete  10/31/2014   CLINICAL DATA:  Abdominal pain.  Diffuse abdominal pain.  EXAM: ULTRASOUND ABDOMEN COMPLETE  COMPARISON:  Radiographs 10/31/2014  FINDINGS: Gallbladder: Large volume of sludge layers dependently within the lumen of the gallbladder. There is no gallbladder wall thickening or pericholecystic fluid. Negative sonographic Murphy's sign.  Common bile duct: Diameter: Upper limits of normal at 6 mm.  Liver: No focal lesion identified. Within normal limits in parenchymal echogenicity.  IVC: No abnormality visualized.  Pancreas: Not identified  Spleen: Size and appearance within normal limits.  Right Kidney: Length: 10.9 cm. Echogenicity within normal limits. No mass or hydronephrosis visualized.  Left Kidney: Length: 11.5 cm. Echogenicity within normal limits. No mass or hydronephrosis visualized.  Abdominal aorta: No aneurysm visualized.  Other findings: No free fluid.  IMPRESSION: 1. Gallbladder sludge  without evidence of acute cholecystitis. 2. Common bile duct upper limits of normal.   Electronically Signed   By: Genevive BiStewart  Edmunds M.D.   On: 10/31/2014 14:36   Dg Chest Port 1 View  11/01/2014   CLINICAL DATA:  Pulmonary edema  EXAM: PORTABLE CHEST - 1 VIEW  COMPARISON:  Chest radiograph 10/29/2014 and acute abdominal series 11 08/2014  FINDINGS: Cardiomegaly appears similar to recent prior studies. There is cephalization above the pulmonary vascularity. There is diffuse interstitial prominence bilaterally with mild perihilar and right basilar airspace disease. No visible pleural effusion on these portable AP views of the chest. Negative for pneumothorax. Thin linear sclerotic area associated with the anterior lateral left sixth rib is unchanged dating back to 10/27/2014 and could reflect remote posttraumatic changes.  IMPRESSION: Mild pulmonary edema pattern.   Electronically Signed   By: Britta MccreedySusan  Turner M.D.   On: 11/01/2014 14:00   Dg Chest Port 1 View  10/29/2014   CLINICAL DATA:  Fever.  Smoker.  EXAM: PORTABLE CHEST - 1 VIEW  COMPARISON:  10/29/2014  FINDINGS: Endotracheal tube is been removed. Enteric tube tip is in the left upper quadrant consistent with location in the upper stomach. Shallow inspiration. Cardiac enlargement without significant vascular congestion. Atelectasis or infiltration in the lung bases, greater on the left. No pneumothorax. Similar appearance to previous study.  IMPRESSION: Cardiac enlargement. Shallow inspiration with atelectasis or infiltration in the lung bases, similar to prior study.   Electronically Signed   By: Burman NievesWilliam  Stevens M.D.   On: 10/29/2014 21:05   Dg Chest Port 1 View  10/29/2014   CLINICAL DATA:  68 year old male currently admitted with acute respiratory failure.  EXAM: PORTABLE CHEST - 1 VIEW  COMPARISON:  Prior chest x-ray 10/27/2014  FINDINGS: Endotracheal tube 7.1 cm above the carina. The tip of the nasogastric tube overlies the upper stomach just  beyond the GE junction. Stable borderline cardiomegaly. Developing hazy airspace opacity in the right lower lobe concerning for pneumonia versus aspiration. Small left pleural effusion and associated left basilar atelectasis. Mild pulmonary vascular congestion without overt edema. No pneumothorax. No acute osseous abnormality.  IMPRESSION: 1. Developing hazy opacity in the medial right lower lobe concerning for pneumonia or aspiration. 2. Trace left pleural effusion with associated atelectasis. 3. Stable borderline cardiomegaly. 4. Stable position of endotracheal tube. 5. The tip of the nasogastric tube overlies the upper stomach just beyond the GE junction. Consider advancing several cm.   Electronically Signed   By: Malachy MoanHeath  McCullough M.D.   On: 10/29/2014 07:42   Dg Chest Portable 1 View  10/27/2014   CLINICAL DATA:  Weakness cardiac arrest.  Unresponsive.  EXAM: PORTABLE CHEST - 1 VIEW  COMPARISON:  None.  FINDINGS: Endotracheal tube with tip near the clavicular heads. An orogastric tube reaches the stomach.  There is no edema, consolidation, effusion, or pneumothorax. Thin line left lateral to the descending thoracic aorta favors minimal atelectasis. No convincing pneumomediastinum.  No visible rib fracture.  IMPRESSION: 1. Endotracheal and orogastric tubes are in good position. 2. No cardiomegaly or pulmonary edema.   Electronically Signed   By: Tiburcio PeaJonathan  Watts M.D.   On: 10/27/2014 13:48   Dg Abd Acute W/chest  10/31/2014   CLINICAL DATA:  Lower chest and upper abdominal pain.  EXAM: ACUTE ABDOMEN SERIES (ABDOMEN 2 VIEW & CHEST 1 VIEW)  COMPARISON:  10/29/2014  FINDINGS: The heart is mildly enlarged. There is density at the left lung base partially obscuring the hemidiaphragm and associated with air bronchograms. There has been some improvement in aeration of the right lung base. No free intraperitoneal air.  Bowel gas pattern is nonobstructive. No evidence for organomegaly. No abnormal calcifications.   IMPRESSION: 1. Cardiomegaly. 2. Left lower lobe infiltrate. 3. Nonobstructive bowel gas pattern.   Electronically Signed   By: Rosalie GumsBeth  Brown M.D.   On: 10/31/2014 07:06   Dg Abd Portable 1v  10/29/2014   CLINICAL DATA:  Nasogastric tube placement  EXAM: PORTABLE ABDOMEN - 1 VIEW  COMPARISON:  No similar prior exam is available at this institution for comparison or on [facility] P[facility].  FINDINGS: Artifact from multiple support devices is noted. Nasogastric tube tip visualized over the body of the stomach. Side hole is at the GE junction which may increase the risk of reflux, consider advancing 3-5 cm.  IMPRESSION: Tip of NG tube projects over the body of the stomach. Consider advancing 3-5 cm for placement of the side hole distal to the GE junction.   Electronically Signed   By: Christiana PellantGretchen  Green M.D.   On: 10/29/2014 09:50     Assessment and Plan  1. VF arrest in the setting of inferior STEMI/CAD - s/p PTCA with balloon angioplasty only of mid and distal RCA. Per Dr Clifton JamesMcalhany  - given homelessness and diffuse disease would not rx proximal and mid RCA - medical therapy - continue Brilinta, aspirin, lipitor, Coreg (will need to abstain from cocaine) - with his anoxic encephalopathy, agitation/noncompliance, and poor social situation he is unlikely to be a candidate for AICD 2. 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 VDRF post arrest with early HCAP vs aspiration PNA (strep) - appreciate IM assistance 4. Cocaine abuse, homelessness, and agitation/inappropriate behavior  - Psych has seen and new meds started including depakote and klonipin  Will need SNF at d/c   5. Post-anoxic encephalopathy - improved but son states agitation is baseline 6. Paroxysmal atrial fibrillation - per Dr. Herbie BaltimoreHarding, amiodarone 400 mg / day  7. HTN 8. Lyte abnormalities of hypernatremia, hypokalemia - internal medicine is repleting  9. Gastric ileus 10. S/p  fall 11/10 - patient attempted to get off chair, tripped and fell without reported injuries 11. Abnormal LFTs, possibly d/t shock liver, improving 12. Severe protein calorie malnutrition 13. Hyperlipidemia 14. Pre-diabetes 15. ABL anemia   Will sign off Any outpatient f/u for cardiology should be with Dr Pollyann KennedyMark Skains  Cyniah Gossard

## 2014-11-04 NOTE — Progress Notes (Addendum)
NUTRITION FOLLOW UP  DOCUMENTATION CODES Per approved criteria  -Severe malnutrition in the context of chronic illness   INTERVENTION:  No nutrition intervention warranted at this time  RD to continue to follow  NUTRITION DIAGNOSIS: Inadequate oral intake, resolved  Goal: Pt to meet >/= 90% of their estimated nutrition needs, met  Monitor:  PO intake, weight, labs, I/O's  ASSESSMENT: Pt had witnessed v fib arrest after entering homeless shelter. Pt intubated and sent to [facility] s/p cath. Pt positive for cocaine and benzos on admission.   Pt extubated 11/7.  Pt transferred from [facility] to 2W-Cardiac 11/9.  Currently on a Heart Healthy diet.  PO intake 100% per flowsheet records.  Psychiatry note reviewed.  Feel pt is meeting >/= 90% of his re-estimated nutritional needs.  Height: Ht Readings from Last 1 Encounters:  10/27/14 $RemoveB'6\' 1"'eAVSMuqd$  (1.854 m)    Weight: Wt Readings from Last 1 Encounters:  11/04/14 150 lb 2.1 oz (68.1 kg)    BMI:  Body mass index is 19.81 kg/(m^2).  Re-estimated Nutritional Needs: Kcal: 2000-2200 Protein: 100-110 gm Fluid: 2.0-2.2 L  Skin: diabetic toe ulcer  Diet Order: Diet Heart   Intake/Output Summary (Last 24 hours) at 11/04/14 1200 Last data filed at 11/04/14 0930  Gross per 24 hour  Intake    480 ml  Output      0 ml  Net    480 ml    Labs:   Recent Labs Lab 10/30/14 0350  11/01/14 0417 11/02/14 0400 11/03/14 0307  NA 140  < > 138 142 142  K 4.2  < > 3.6* 3.0* 3.7  CL 101  < > 101 103 104  CO2 24  < > $R'25 27 25  'hu$ BUN 12  < > $R'13 13 12  'QA$ CREATININE 0.64  < > 0.74 0.78 0.75  CALCIUM 9.5  < > 8.9 9.0 8.9  MG 1.6  --   --  2.0  --   GLUCOSE 94  < > 95 117* 98  < > = values in this interval not displayed.  CBG (last 3)   Recent Labs  11/01/14 1623 11/02/14 0252 11/02/14 2129  GLUCAP 91 129* 130*    Scheduled Meds: . [START ON 11/08/2014] amiodarone  400 mg Oral Daily   Followed by  . amiodarone  400 mg  Oral BID   Followed by  . [START ON 11/15/2014] amiodarone  200 mg Oral Daily  . antiseptic oral rinse  7 mL Mouth Rinse q12n4p  . aspirin  81 mg Oral Daily  . atorvastatin  80 mg Oral q1800  . carvedilol  6.25 mg Oral BID WC  . chlorhexidine  15 mL Mouth Rinse BID  . clonazePAM  0.5 mg Oral BID  . divalproex  500 mg Oral BID  . enoxaparin (LOVENOX) injection  40 mg Subcutaneous Q24H  . lisinopril  2.5 mg Oral Daily  . LORazepam  1 mg Intravenous Once  . pantoprazole  40 mg Oral Q1200  . QUEtiapine  25 mg Oral BID    Continuous Infusions: . sodium chloride 5 mL/hr at 10/31/14 2000    Past Medical History  Diagnosis Date  . Smoker     History reviewed. No pertinent past surgical history.  Arthur Holms, RD, LDN Pager #: [phone] After-Hours Pager #: [phone]

## 2014-11-05 LAB — CULTURE, BLOOD (ROUTINE X 2)
CULTURE: NO GROWTH
Culture: NO GROWTH

## 2014-11-05 MED ORDER — TICAGRELOR 90 MG PO TABS
90.0000 mg | ORAL_TABLET | Freq: Two times a day (BID) | ORAL | Status: DC
  Filled 2014-11-05 (×2): qty 1

## 2014-11-05 MED ORDER — TICAGRELOR 90 MG PO TABS: 90.0000 mg | ORAL_TABLET | Freq: Two times a day (BID) | ORAL | Status: DC

## 2014-11-05 MED ORDER — CLONAZEPAM 0.5 MG PO TABS: 0.5000 mg | ORAL_TABLET | Freq: Two times a day (BID) | ORAL | Status: DC

## 2014-11-05 MED ORDER — AMIODARONE HCL 200 MG PO TABS: 200.0000 mg | ORAL_TABLET | Freq: Every day | ORAL | Status: DC

## 2014-11-05 MED ORDER — AMIODARONE HCL 400 MG PO TABS: 400.0000 mg | ORAL_TABLET | Freq: Two times a day (BID) | ORAL | Status: DC

## 2014-11-05 MED ORDER — AMIODARONE HCL 400 MG PO TABS: 400.0000 mg | ORAL_TABLET | Freq: Every day | ORAL | Status: DC

## 2014-11-05 NOTE — Progress Notes (Signed)
MEDICATION RELATED CONSULT NOTE - INITIAL   Pharmacy Consult for question re: course of ticagrelor during hospitalization Indication: S/p balloon angioplasty 11/5  No Known Allergies  Patient Measurements: Height: 6\' 1"  (185.4 cm) Weight: 149 lb 0.5 oz (67.6 kg) IBW/kg (Calculated) : 75.4  Vital Signs: Temp: 99 F (37.2 C) (11/14 0518) Temp Source: Oral (11/14 0518) BP: 117/68 mmHg (11/14 0518) Pulse Rate: 58 (11/14 0518) Intake/Output from previous day: 11/13 0701 - 11/14 0700 In: 480 [P.O.:480] Out: -  Intake/Output from this shift: Total I/O In: 240 [P.O.:240] Out: -   Labs:  Recent Labs  11/03/14 0307  WBC 7.3  HGB 11.3*  HCT 34.0*  PLT 236  CREATININE 0.75  ALBUMIN 2.9*  PROT 6.5  AST 53*  ALT 43  ALKPHOS 72  BILITOT 0.6   Estimated Creatinine Clearance: 86.8 mL/min (by C-G formula based on Cr of 0.75).   Microbiology: Recent Results (from the past 720 hour(s))  Urine culture     Status: None   Collection Time: 10/27/14  4:35 PM  Result Value Ref Range Status   Specimen Description URINE, RANDOM  Final   Special Requests Normal  Final   Culture  Setup Time   Final    10/27/2014 21:50 Performed at [facility]    Colony Count NO GROWTH Performed at [facility]   Final   Culture NO GROWTH Performed at [facility]   Final   Report Status 10/28/2014 FINAL  Final  MRSA PCR Screening     Status: None   Collection Time: 10/27/14  4:35 PM  Result Value Ref Range Status   MRSA by PCR NEGATIVE NEGATIVE Final    Comment:        The GeneXpert MRSA Assay (FDA approved for NASAL specimens only), is one component of a comprehensive MRSA colonization surveillance program. It is not intended to diagnose MRSA infection nor to guide or monitor treatment for MRSA infections.   Culture, blood (routine x 2)     Status: None   Collection Time: 10/27/14  7:30 PM  Result Value Ref Range Status   Specimen Description BLOOD LEFT  ARM  Final   Special Requests BOTTLES DRAWN AEROBIC AND ANAEROBIC 5 CC  Final   Culture  Setup Time   Final    10/28/2014 01:00 Performed at [facility]    Culture   Final    NO GROWTH 5 DAYS Performed at [facility]    Report Status 11/03/2014 FINAL  Final  Culture, respiratory (NON-Expectorated)     Status: None   Collection Time: 10/29/14  9:15 AM  Result Value Ref Range Status   Specimen Description TRACHEAL ASPIRATE  Final   Special Requests NONE  Final   Gram Stain   Final    ABUNDANT WBC PRESENT, PREDOMINANTLY PMN NO SQUAMOUS EPITHELIAL CELLS SEEN MODERATE GRAM POSITIVE COCCI IN PAIRS IN CHAINS IN CLUSTERS Performed at [facility]    Culture   Final    ABUNDANT STREPTOCOCCUS PNEUMONIAE Performed at [facility]    Report Status 11/01/2014 FINAL  Final   Organism ID, Bacteria STREPTOCOCCUS PNEUMONIAE  Final      Susceptibility   Streptococcus pneumoniae - MIC (ETEST)*    CEFTRIAXONE 0.38 SENSITIVE Sensitive     LEVOFLOXACIN 0.75 SENSITIVE Sensitive     PENICILLIN 0.75 INTERMEDIATE Intermediate     * ABUNDANT STREPTOCOCCUS PNEUMONIAE  Culture, Urine     Status: None   Collection  Time: 10/29/14  8:16 PM  Result Value Ref Range Status   Specimen Description URINE, CATHETERIZED  Final   Special Requests NONE  Final   Culture  Setup Time   Final    10/29/2014 21:29 Performed at [facility]    Colony Count   Final    50,000 COLONIES/ML Performed at [facility]    Culture   Final    Multiple bacterial morphotypes present, none predominant. Suggest appropriate recollection if clinically indicated. Performed at [facility]    Report Status 10/31/2014 FINAL  Final  Culture, blood (routine x 2)     Status: None (Preliminary result)   Collection Time: 10/29/14  9:09 PM  Result Value Ref Range Status   Specimen Description BLOOD LEFT ANTECUBITAL  Final   Special Requests BOTTLES DRAWN AEROBIC ONLY 5CC   Final   Culture  Setup Time   Final    10/30/2014 00:58 Performed at [facility]    Culture   Final           BLOOD CULTURE RECEIVED NO GROWTH TO DATE CULTURE WILL BE HELD FOR 5 DAYS BEFORE ISSUING A FINAL NEGATIVE REPORT Performed at [facility]    Report Status PENDING  Incomplete  Culture, blood (routine x 2)     Status: None (Preliminary result)   Collection Time: 10/29/14  9:25 PM  Result Value Ref Range Status   Specimen Description BLOOD LEFT ANTECUBITAL  Final   Special Requests BOTTLES DRAWN AEROBIC ONLY 1CC  Final   Culture  Setup Time   Final    10/30/2014 00:58 Performed at [facility]    Culture   Final           BLOOD CULTURE RECEIVED NO GROWTH TO DATE CULTURE WILL BE HELD FOR 5 DAYS BEFORE ISSUING A FINAL NEGATIVE REPORT Performed at [facility]    Report Status PENDING  Incomplete  Clostridium Difficile by PCR     Status: None   Collection Time: 11/02/14  1:42 PM  Result Value Ref Range Status   C difficile by pcr NEGATIVE NEGATIVE Final    Medical History: Past Medical History  Diagnosis Date  . Smoker     Medications:  No prescriptions prior to admission    Assessment: 68 yo M admitted 10/27/2014  With out of hospital cardiac arrest.  Patient was taken to the cath lab 11/5 with inferior STEMI with occluded RCA.  RCA was treated with balloon angioplasty.  Further intervention was deferred due to diffuse heavy calcification and concerns for long term medical compliance, patient noted to be at high risk for stent thrombosis.  Ticagrelor course: Load  11/5 180 mg x 1 Maintenance dose 90 BID  11/6>>11/8 11/9 ticagrelor stopped due to high NGTube output and inability to tolerate PO.    Goal of Therapy:  Ticagrelor therapy post STEMI but no stent.   Plan:  Ticagrelor therapy interrupted due to oral intake on 11/9 Consider resuming ticagrelor 90 mg PO BID  Thank you for allowing pharmacy to be a part of this patients  care team.  Lovenia KimJulie Lucille Crichlow Pharm.D., BCPS, AQ-Cardiology Clinical Pharmacist 11/05/2014 1:00 PM Pager: [phone] Phone: [phone]

## 2014-11-05 NOTE — Plan of Care (Signed)
Problem: Phase II Progression Outcomes Goal: Hemodynamically stable Outcome: Progressing     

## 2014-11-05 NOTE — Progress Notes (Signed)
[facility].  Patient is agreeable to placement and his son is currently there filling out paperwork.  CSW completed FL-2, signed by attending, medical necessity form and discharge forms printed and on chart.  [facility] and [name] called to transport patient.  CSW signing off.  [facility] Laporshia Hogen Macy MisLCSW,LCAS [place] [facility] CSW [phone]

## 2014-11-05 NOTE — Discharge Summary (Addendum)
Physician Discharge Summary  [name] MRN: [id number] DOB/AGE: 04-12-1946 68 y.o.  PCP: No primary care provider on file. .  Admit date: 10/27/2014 Discharge date: 11/05/2014  Discharge Diagnoses:   :   Ventricular fibrillation - 2/2 Inferior STEMI Substance Induced Mood Disorder and Cocaine abuse   Inferior MI   Protein-calorie malnutrition, severe   Respiratory failure   STEMI (ST elevation myocardial infarction)   Hypoxic ischemic encephalopathy (HIE)   Abdominal pain   Hyperlipidemia with target LDL less than 70   Pre-diabetes   Transaminitis   Hypokalemia   Atrial fibrillation   Cocaine abuse   Chronic combined systolic and diastolic CHF (congestive heart failure)   Acute blood loss anemia   CAD S/P percutaneous coronary angioplasty - PTCA only of dRCA 100% for STEMI, heavily calcified vessel with extensive disease   Cardiac arrest   Follow-up recommendations Follow-up with outpatient psychiatry  Follow-up with PCP in 5-7 days Follow-up CBC, BMP in one week    Medication List    TAKE these medications        amiodarone 400 MG tablet  Commonly known as:  PACERONE  Take 1 tablet (400 mg total) by mouth 2 (two) times daily.     amiodarone 400 MG tablet  Commonly known as:  PACERONE  Take 1 tablet (400 mg total) by mouth daily.  Start taking on:  11/08/2014     amiodarone 200 MG tablet  Commonly known as:  PACERONE  Take 1 tablet (200 mg total) by mouth daily.  Start taking on:  11/15/2014     aspirin 81 MG chewable tablet  Chew 1 tablet (81 mg total) by mouth daily.     atorvastatin 80 MG tablet  Commonly known as:  LIPITOR  Take 1 tablet (80 mg total) by mouth daily at 6 PM.     carvedilol 6.25 MG tablet  Commonly known as:  COREG  Take 1 tablet (6.25 mg total) by mouth 2 (two) times daily with a meal.     clonazePAM 0.5 MG tablet  Commonly known as:  KLONOPIN  Take 1 tablet (0.5 mg total) by mouth 2 (two) times daily.     divalproex  500 MG 24 hr tablet  Commonly known as:  DEPAKOTE ER  Take 1 tablet (500 mg total) by mouth 2 (two) times daily.     ipratropium-albuterol 0.5-2.5 (3) MG/3ML Soln  Commonly known as:  DUONEB  Take 3 mLs by nebulization every 6 (six) hours as needed.     lisinopril 2.5 MG tablet  Commonly known as:  PRINIVIL,ZESTRIL  Take 1 tablet (2.5 mg total) by mouth daily.     pantoprazole 40 MG tablet  Commonly known as:  PROTONIX  Take 1 tablet (40 mg total) by mouth daily at 12 noon.     QUEtiapine 25 MG tablet  Commonly known as:  SEROQUEL  Take 1 tablet (25 mg total) by mouth 2 (two) times daily.     ziprasidone injection  Commonly known as:  GEODON  Inject 10 mg into the muscle 4 (four) times daily as needed for agitation (maximum dose 20 mg for 24 hours only.).        Discharge Condition: stable   Disposition: Final discharge disposition not confirmed   Consults:  Psychiatry Cardiology  Significant Diagnostic Studies: Ct Head Wo Contrast  10/27/2014   CLINICAL DATA:  Cardiac arrest.  EXAM: CT HEAD WITHOUT CONTRAST  TECHNIQUE: Contiguous axial images were obtained from  the base of the skull through the vertex without intravenous contrast.  COMPARISON:  None.  FINDINGS: Bony calvarium appears intact. No mass effect or midline shift is noted. Ventricular size is within normal limits. There is no evidence of mass lesion, hemorrhage or acute infarction.  IMPRESSION: Normal head CT.   Electronically Signed   By: Roque LiasJames  Green M.D.   On: 10/27/2014 16:04   [place] Abdomen Complete  10/31/2014   CLINICAL DATA:  Abdominal pain.  Diffuse abdominal pain.  EXAM: ULTRASOUND ABDOMEN COMPLETE  COMPARISON:  Radiographs 10/31/2014  FINDINGS: Gallbladder: Large volume of sludge layers dependently within the lumen of the gallbladder. There is no gallbladder wall thickening or pericholecystic fluid. Negative sonographic Murphy's sign.  Common bile duct: Diameter: Upper limits of normal at 6 mm.  Liver: No  focal lesion identified. Within normal limits in parenchymal echogenicity.  IVC: No abnormality visualized.  Pancreas: Not identified  Spleen: Size and appearance within normal limits.  Right Kidney: Length: 10.9 cm. Echogenicity within normal limits. No mass or hydronephrosis visualized.  Left Kidney: Length: 11.5 cm. Echogenicity within normal limits. No mass or hydronephrosis visualized.  Abdominal aorta: No aneurysm visualized.  Other findings: No free fluid.  IMPRESSION: 1. Gallbladder sludge without evidence of acute cholecystitis. 2. Common bile duct upper limits of normal.   Electronically Signed   By: Genevive BiStewart  Edmunds M.D.   On: 10/31/2014 14:36   Dg Chest Port 1 View  11/01/2014   CLINICAL DATA:  Pulmonary edema  EXAM: PORTABLE CHEST - 1 VIEW  COMPARISON:  Chest radiograph 10/29/2014 and acute abdominal series 11 08/2014  FINDINGS: Cardiomegaly appears similar to recent prior studies. There is cephalization above the pulmonary vascularity. There is diffuse interstitial prominence bilaterally with mild perihilar and right basilar airspace disease. No visible pleural effusion on these portable AP views of the chest. Negative for pneumothorax. Thin linear sclerotic area associated with the anterior lateral left sixth rib is unchanged dating back to 10/27/2014 and could reflect remote posttraumatic changes.  IMPRESSION: Mild pulmonary edema pattern.   Electronically Signed   By: Britta MccreedySusan  Turner M.D.   On: 11/01/2014 14:00   Dg Chest Port 1 View  10/29/2014   CLINICAL DATA:  Fever.  Smoker.  EXAM: PORTABLE CHEST - 1 VIEW  COMPARISON:  10/29/2014  FINDINGS: Endotracheal tube is been removed. Enteric tube tip is in the left upper quadrant consistent with location in the upper stomach. Shallow inspiration. Cardiac enlargement without significant vascular congestion. Atelectasis or infiltration in the lung bases, greater on the left. No pneumothorax. Similar appearance to previous study.  IMPRESSION: Cardiac  enlargement. Shallow inspiration with atelectasis or infiltration in the lung bases, similar to prior study.   Electronically Signed   By: Burman NievesWilliam  Stevens M.D.   On: 10/29/2014 21:05   Dg Chest Port 1 View  10/29/2014   CLINICAL DATA:  68 year old male currently admitted with acute respiratory failure.  EXAM: PORTABLE CHEST - 1 VIEW  COMPARISON:  Prior chest x-ray 10/27/2014  FINDINGS: Endotracheal tube 7.1 cm above the carina. The tip of the nasogastric tube overlies the upper stomach just beyond the GE junction. Stable borderline cardiomegaly. Developing hazy airspace opacity in the right lower lobe concerning for pneumonia versus aspiration. Small left pleural effusion and associated left basilar atelectasis. Mild pulmonary vascular congestion without overt edema. No pneumothorax. No acute osseous abnormality.  IMPRESSION: 1. Developing hazy opacity in the medial right lower lobe concerning for pneumonia or aspiration. 2. Trace left pleural effusion  with associated atelectasis. 3. Stable borderline cardiomegaly. 4. Stable position of endotracheal tube. 5. The tip of the nasogastric tube overlies the upper stomach just beyond the GE junction. Consider advancing several cm.   Electronically Signed   By: Malachy Moan M.D.   On: 10/29/2014 07:42   Dg Chest Portable 1 View  10/27/2014   CLINICAL DATA:  Weakness cardiac arrest.  Unresponsive.  EXAM: PORTABLE CHEST - 1 VIEW  COMPARISON:  None.  FINDINGS: Endotracheal tube with tip near the clavicular heads. An orogastric tube reaches the stomach.  There is no edema, consolidation, effusion, or pneumothorax. Thin line left lateral to the descending thoracic aorta favors minimal atelectasis. No convincing pneumomediastinum.  No visible rib fracture.  IMPRESSION: 1. Endotracheal and orogastric tubes are in good position. 2. No cardiomegaly or pulmonary edema.   Electronically Signed   By: Tiburcio Pea M.D.   On: 10/27/2014 13:48   Dg Abd Acute  W/chest  10/31/2014   CLINICAL DATA:  Lower chest and upper abdominal pain.  EXAM: ACUTE ABDOMEN SERIES (ABDOMEN 2 VIEW & CHEST 1 VIEW)  COMPARISON:  10/29/2014  FINDINGS: The heart is mildly enlarged. There is density at the left lung base partially obscuring the hemidiaphragm and associated with air bronchograms. There has been some improvement in aeration of the right lung base. No free intraperitoneal air.  Bowel gas pattern is nonobstructive. No evidence for organomegaly. No abnormal calcifications.  IMPRESSION: 1. Cardiomegaly. 2. Left lower lobe infiltrate. 3. Nonobstructive bowel gas pattern.   Electronically Signed   By: Rosalie Gums M.D.   On: 10/31/2014 07:06   Dg Abd Portable 1v  10/29/2014   CLINICAL DATA:  Nasogastric tube placement  EXAM: PORTABLE ABDOMEN - 1 VIEW  COMPARISON:  No similar prior exam is available at this institution for comparison or on [facility].  FINDINGS: Artifact from multiple support devices is noted. Nasogastric tube tip visualized over the body of the stomach. Side hole is at the GE junction which may increase the risk of reflux, consider advancing 3-5 cm.  IMPRESSION: Tip of NG tube projects over the body of the stomach. Consider advancing 3-5 cm for placement of the side hole distal to the GE junction.   Electronically Signed   By: Christiana Pellant M.D.   On: 10/29/2014 09:50    2-D echo Left ventricle: The cavity size was normal. Wall thickness was normal. Systolic function was severely reduced. The estimated ejection fraction was in the range of 20% to 25%. Akinesis of the basal-midinferior myocardium. Akinesis of the basal-midanteroseptal myocardium. Doppler parameters are consistent with abnormal left ventricular relaxation (grade 1 diastolic dysfunction). - Mitral valve: There was mild regurgitation.  Microbiology: Recent Results (from the past 240 hour(s))  Urine culture     Status: None   Collection Time: 10/27/14  4:35 PM  Result Value  Ref Range Status   Specimen Description URINE, RANDOM  Final   Special Requests Normal  Final   Culture  Setup Time   Final    10/27/2014 21:50 Performed at [facility]    Colony Count NO GROWTH Performed at [facility]   Final   Culture NO GROWTH Performed at [facility]   Final   Report Status 10/28/2014 FINAL  Final  MRSA PCR Screening     Status: None   Collection Time: 10/27/14  4:35 PM  Result Value Ref Range Status   MRSA by PCR NEGATIVE NEGATIVE Final    Comment:  The GeneXpert MRSA Assay (FDA approved for NASAL specimens only), is one component of a comprehensive MRSA colonization surveillance program. It is not intended to diagnose MRSA infection nor to guide or monitor treatment for MRSA infections.   Culture, blood (routine x 2)     Status: None   Collection Time: 10/27/14  7:30 PM  Result Value Ref Range Status   Specimen Description BLOOD LEFT ARM  Final   Special Requests BOTTLES DRAWN AEROBIC AND ANAEROBIC 5 CC  Final   Culture  Setup Time   Final    10/28/2014 01:00 Performed at [facility]    Culture   Final    NO GROWTH 5 DAYS Performed at [facility]    Report Status 11/03/2014 FINAL  Final  Culture, respiratory (NON-Expectorated)     Status: None   Collection Time: 10/29/14  9:15 AM  Result Value Ref Range Status   Specimen Description TRACHEAL ASPIRATE  Final   Special Requests NONE  Final   Gram Stain   Final    ABUNDANT WBC PRESENT, PREDOMINANTLY PMN NO SQUAMOUS EPITHELIAL CELLS SEEN MODERATE GRAM POSITIVE COCCI IN PAIRS IN CHAINS IN CLUSTERS Performed at [facility]    Culture   Final    ABUNDANT STREPTOCOCCUS PNEUMONIAE Performed at [facility]    Report Status 11/01/2014 FINAL  Final   Organism ID, Bacteria STREPTOCOCCUS PNEUMONIAE  Final      Susceptibility   Streptococcus pneumoniae - MIC (ETEST)*    CEFTRIAXONE 0.38 SENSITIVE Sensitive     LEVOFLOXACIN  0.75 SENSITIVE Sensitive     PENICILLIN 0.75 INTERMEDIATE Intermediate     * ABUNDANT STREPTOCOCCUS PNEUMONIAE  Culture, Urine     Status: None   Collection Time: 10/29/14  8:16 PM  Result Value Ref Range Status   Specimen Description URINE, CATHETERIZED  Final   Special Requests NONE  Final   Culture  Setup Time   Final    10/29/2014 21:29 Performed at [facility] Count   Final    50,000 COLONIES/ML Performed at [facility]    Culture   Final    Multiple bacterial morphotypes present, none predominant. Suggest appropriate recollection if clinically indicated. Performed at [facility]    Report Status 10/31/2014 FINAL  Final  Culture, blood (routine x 2)     Status: None (Preliminary result)   Collection Time: 10/29/14  9:09 PM  Result Value Ref Range Status   Specimen Description BLOOD LEFT ANTECUBITAL  Final   Special Requests BOTTLES DRAWN AEROBIC ONLY 5CC  Final   Culture  Setup Time   Final    10/30/2014 00:58 Performed at [facility]    Culture   Final           BLOOD CULTURE RECEIVED NO GROWTH TO DATE CULTURE WILL BE HELD FOR 5 DAYS BEFORE ISSUING A FINAL NEGATIVE REPORT Performed at [facility]    Report Status PENDING  Incomplete  Culture, blood (routine x 2)     Status: None (Preliminary result)   Collection Time: 10/29/14  9:25 PM  Result Value Ref Range Status   Specimen Description BLOOD LEFT ANTECUBITAL  Final   Special Requests BOTTLES DRAWN AEROBIC ONLY 1CC  Final   Culture  Setup Time   Final    10/30/2014 00:58 Performed at [facility]    Culture   Final  BLOOD CULTURE RECEIVED NO GROWTH TO DATE CULTURE WILL BE HELD FOR 5 DAYS BEFORE ISSUING A FINAL NEGATIVE REPORT Performed at [facility]    Report Status PENDING  Incomplete  Clostridium Difficile by PCR     Status: None   Collection Time: 11/02/14  1:42 PM  Result Value Ref Range Status   C difficile by pcr  NEGATIVE NEGATIVE Final     Labs: No results found for this or any previous visit (from the past 48 hour(s)).  History of present illness 68 year old male with history of cocaine abuse, found down for 5-10 minutes and in Vfib arrest in setting of inferior STEMI. He was resuscitated and underwent emergent cardiac catheterization on 10/27/14 (occluded distal RCA). Echo on 10/28/14 showed EF 20-25 percent with grade 1 diastolic dysfunction. Patient was in ICU under PCCM care and was transferred to floor on 11/01/14 under cardiology service with [facility] providing consultation.patient transferred to [facility]  Inpatient medicine service on 11/12..   Assessment/Plan:  1. VF arrest in the setting of inferior STEMI/CAD - s/p PTCA with balloon angioplasty only of mid and distal RCA. Dr. Eden Emms d/w Dr. Clifton Dhanush yesterday - given homelessness and diffuse disease would not rx proximal and mid RCA - medical therapy - patient started on Brilinta (per cardiology) , aspirin, lipitor, Coreg (will need to abstain from cocaine) - with his anoxic encephalopathy, agitation/noncompliance, and poor social situation he is unlikely to be a candidate for lifevest Patient not wanting to keep his telemetry leads on. Therefore discontinue telemetry  2. 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 Started on lisinopril 2.5 mg. Not a candidate for AICD despite ischemic dilated cardiomyopathy.   3. Early HCAP Vs Aspiration PNA (strep): change IV Zosyn to Rocephin based on sputum culture from 11/7. Anticipate completion of antibiotics on 11/13, total of 8 days  4. Cocaine abuse, homelessness, and agitation/inappropriate behavior-improving on Valium discontinue lorazepam, we had to replace sitter because of agitation last night, no longer in restraints - psychiatry consultation today , Dr Elsie Saas has seen the patient , discussed patients current behaviour , avoid QT  prolonging agents given cardiomyopathy, started on depakote and klonopin as he has recommended, he also recommend seroquel and geodon. Psychiatry recommends discharge to SNF when stable. No indication for inpatient psychiatry admission  5. Paroxysmal atrial fibrillation - per Dr. Herbie Baltimore, amiodarone 400mg  BID x 1 week then 400mg  daily for 1 week then 200mg  daily -hold off on anticoagulation per cardiology   Other chronic issues 1. Hypertension: Controlled. 2. Hypernatremia: Resolved 3. Hypokalemia: replace as needed and follow BMP. 4. Gastric ileus: Resolved and tolerating diet per nursing. 5. Normocytic anemia: Stable. 6. Post anoxic acute encephalopathy: seems confused. Requiring a sitter, restraints reviewed, psychiatry consultation today 7. S/p Fall 11/10: patient attempted to get off the chair and apparently tripped and sustained a fall. No reported injuries. Fall precautions and monitor. 8. Abnormal LFTs/transaminitis: Improving. ? Shock liver. Ultrasound abdomen shows gallbladder sludge without evidence of acute cholecystitis.   Code Status: Full  Family Communication: Discussed with daughter at bedside. Disposition Plan: anticipate discharge tomorrow, has 3 bed offers, psychiatry consultation today to determine capacity   SIGNIFICANT EVENTS/STUDIES: 11/05 Vfib arrest with 5- 15 minutes down time. DCCV X 4 reported 11/05 LHC: Inferior STEMI secondary to occluded distal RCA. Diffuse heavy calcification in the entire RCA with multiple segments of severe stenosis. Severe LV systolic dysfunction 11/05 CT head: NAD 11/06 TTE: LVEF 20-25%. AK of basal-midinferior  myocardium. Grade 1 DD 11/06 EEG: mod-severe generalized slowing without epileptiform discharges 11/07 Passed SBT. Very agitated and not F/C on WUA. Extubated and no distress post extubation. dexmedetomidine gtt ordered 11/08 Has tolerated extubation. Cognition much improved. Abdominal exam abnormal/firm with diminished BS.  High NGT output. Amylase/lipase/LFTs ordered 11/8 amio for fib 11/9 - high output from NGT  Consultants:  PCCM  Cardiology  Procedures: ETT 11/05 >> 11/07 L radial A line 11/05 >> 11/08 R femoral venous sheath 11/05 >> 11/07  Antibiotics: Ceftriaxone 11/07 >> 11/08 Pip-tazo 11/08 >> 11/13    Discharge Exam:    Blood pressure 117/68, pulse 58, temperature 99 F (37.2 C), temperature source Oral, resp. rate 18, height 6\' 1"  (1.854 m), weight 67.6 kg (149 lb 0.5 oz), SpO2 100 %.  General: Well developed thin AAM in no acute distress. Head: Normocephalic, atraumatic, sclera non-icteric, no xanthomas, nares are without discharge. Neck: JVP not elevated. Lungs: Clear bilaterally to auscultation without wheezes, rales, or rhonchi. Breathing is unlabored. Heart: RRR S1 S2 without murmurs, rubs, or gallops.  Abdomen: Soft, non-tender, non-distended.  Extremities: No clubbing or cyanosis. No edema. Distal pedal pulses are 2+ and equal bilaterally. Neuro: Currently combative and requiring restraints but was cooperative with respiratory and heart exam       Discharge Instructions    Diet - low sodium heart healthy    Complete by:  As directed      Increase activity slowly    Complete by:  As directed              Signed: Nanea Jared 11/05/2014, 12:28 PM

## 2014-11-05 NOTE — Progress Notes (Signed)
   Called by Dr. Susie CassetteAbrol regarding Marden NobleBrilinta. I have reviewed the chart along with Dr. Armanda Magicraci Turner. The notes indicate the patient was not tolerating POs very well after extubation and Brilinta was to be resumed once he was taking POs better.  It appears that this was never re-ordered.   He had balloon angioplasty only of the mid and distal RCA in the setting of his inferior STEMI.  No stent was deployed. Medical therapy is otherwise planned given co-morbid conditions. Patient does not require loading dose at this time for Brilinta.    -  Start Brilinta 90 mg twice daily.  Order will be placed.    -  DC to SNF is pending. Signed, Tereso NewcomerScott Bora Bost, PA-C   11/05/2014 1:02 PM

## 2014-11-05 NOTE — Clinical Social Work Note (Signed)
[facility] Pt's son, [name], updated regarding discharge.  [facility]: [phone] Transportation: EMS [name]) scheduled for 2pm.  Marcelline Deistmily Biff Rutigliano, [name] [phone]) Licensed Clinical Social Worker Orthopedics [phone]([id number]) and Surgical [phone])

## 2014-11-15 DIAGNOSIS — I480 Paroxysmal atrial fibrillation: Secondary | ICD-10-CM | POA: Insufficient documentation

## 2014-11-15 DIAGNOSIS — I10 Essential (primary) hypertension: Secondary | ICD-10-CM | POA: Insufficient documentation

## 2014-11-15 DIAGNOSIS — J189 Pneumonia, unspecified organism: Secondary | ICD-10-CM | POA: Insufficient documentation

## 2014-11-16 ENCOUNTER — Encounter (HOSPITAL_COMMUNITY): Payer: Self-pay | Admitting: Emergency Medicine

## 2014-12-01 ENCOUNTER — Encounter (HOSPITAL_COMMUNITY): Payer: Self-pay | Admitting: Cardiovascular Disease

## 2015-01-09 ENCOUNTER — Emergency Department (HOSPITAL_COMMUNITY): Payer: Medicare Other

## 2015-01-09 ENCOUNTER — Inpatient Hospital Stay (HOSPITAL_COMMUNITY)
Admission: EM | Admit: 2015-01-09 | Discharge: 2015-01-18 | DRG: 286 | Disposition: A | Discharge status: Home / Self Care | Payer: Medicare Other | Attending: Internal Medicine | Admitting: Internal Medicine

## 2015-01-09 ENCOUNTER — Encounter (HOSPITAL_COMMUNITY): Payer: Self-pay | Admitting: Pulmonary Disease

## 2015-01-09 ENCOUNTER — Inpatient Hospital Stay (HOSPITAL_COMMUNITY): Payer: Medicare Other

## 2015-01-09 DIAGNOSIS — R402122 Coma scale, eyes open, to pain, at arrival to emergency department: Secondary | ICD-10-CM | POA: Diagnosis present

## 2015-01-09 DIAGNOSIS — Z8659 Personal history of other mental and behavioral disorders: Secondary | ICD-10-CM

## 2015-01-09 DIAGNOSIS — I480 Paroxysmal atrial fibrillation: Secondary | ICD-10-CM | POA: Diagnosis not present

## 2015-01-09 DIAGNOSIS — F1410 Cocaine abuse, uncomplicated: Secondary | ICD-10-CM | POA: Diagnosis present

## 2015-01-09 DIAGNOSIS — Z452 Encounter for adjustment and management of vascular access device: Secondary | ICD-10-CM

## 2015-01-09 DIAGNOSIS — I252 Old myocardial infarction: Secondary | ICD-10-CM

## 2015-01-09 DIAGNOSIS — D649 Anemia, unspecified: Secondary | ICD-10-CM | POA: Diagnosis present

## 2015-01-09 DIAGNOSIS — Z95828 Presence of other vascular implants and grafts: Secondary | ICD-10-CM

## 2015-01-09 DIAGNOSIS — E872 Acidosis: Secondary | ICD-10-CM | POA: Diagnosis present

## 2015-01-09 DIAGNOSIS — F39 Unspecified mood [affective] disorder: Secondary | ICD-10-CM | POA: Diagnosis present

## 2015-01-09 DIAGNOSIS — G9340 Encephalopathy, unspecified: Secondary | ICD-10-CM | POA: Diagnosis present

## 2015-01-09 DIAGNOSIS — E876 Hypokalemia: Secondary | ICD-10-CM | POA: Diagnosis not present

## 2015-01-09 DIAGNOSIS — B9620 Unspecified Escherichia coli [E. coli] as the cause of diseases classified elsewhere: Secondary | ICD-10-CM | POA: Diagnosis present

## 2015-01-09 DIAGNOSIS — N390 Urinary tract infection, site not specified: Secondary | ICD-10-CM | POA: Diagnosis present

## 2015-01-09 DIAGNOSIS — I10 Essential (primary) hypertension: Secondary | ICD-10-CM | POA: Diagnosis present

## 2015-01-09 DIAGNOSIS — J9600 Acute respiratory failure, unspecified whether with hypoxia or hypercapnia: Secondary | ICD-10-CM

## 2015-01-09 DIAGNOSIS — I2510 Atherosclerotic heart disease of native coronary artery without angina pectoris: Secondary | ICD-10-CM | POA: Diagnosis present

## 2015-01-09 DIAGNOSIS — I469 Cardiac arrest, cause unspecified: Secondary | ICD-10-CM | POA: Diagnosis not present

## 2015-01-09 DIAGNOSIS — Z7982 Long term (current) use of aspirin: Secondary | ICD-10-CM | POA: Diagnosis not present

## 2015-01-09 DIAGNOSIS — E440 Moderate protein-calorie malnutrition: Secondary | ICD-10-CM | POA: Diagnosis present

## 2015-01-09 DIAGNOSIS — Z681 Body mass index (BMI) 19 or less, adult: Secondary | ICD-10-CM

## 2015-01-09 DIAGNOSIS — J9690 Respiratory failure, unspecified, unspecified whether with hypoxia or hypercapnia: Secondary | ICD-10-CM

## 2015-01-09 DIAGNOSIS — I4891 Unspecified atrial fibrillation: Secondary | ICD-10-CM | POA: Diagnosis present

## 2015-01-09 DIAGNOSIS — J9811 Atelectasis: Secondary | ICD-10-CM

## 2015-01-09 DIAGNOSIS — Z590 Homelessness: Secondary | ICD-10-CM

## 2015-01-09 DIAGNOSIS — J189 Pneumonia, unspecified organism: Secondary | ICD-10-CM

## 2015-01-09 DIAGNOSIS — I4901 Ventricular fibrillation: Principal | ICD-10-CM | POA: Diagnosis present

## 2015-01-09 DIAGNOSIS — I369 Nonrheumatic tricuspid valve disorder, unspecified: Secondary | ICD-10-CM

## 2015-01-09 DIAGNOSIS — J9601 Acute respiratory failure with hypoxia: Secondary | ICD-10-CM | POA: Diagnosis present

## 2015-01-09 DIAGNOSIS — R402222 Coma scale, best verbal response, incomprehensible words, at arrival to emergency department: Secondary | ICD-10-CM | POA: Diagnosis present

## 2015-01-09 DIAGNOSIS — F17210 Nicotine dependence, cigarettes, uncomplicated: Secondary | ICD-10-CM | POA: Diagnosis present

## 2015-01-09 DIAGNOSIS — Z9119 Patient's noncompliance with other medical treatment and regimen: Secondary | ICD-10-CM | POA: Diagnosis present

## 2015-01-09 DIAGNOSIS — Z8674 Personal history of sudden cardiac arrest: Secondary | ICD-10-CM | POA: Diagnosis not present

## 2015-01-09 DIAGNOSIS — Y95 Nosocomial condition: Secondary | ICD-10-CM | POA: Diagnosis present

## 2015-01-09 DIAGNOSIS — R55 Syncope and collapse: Secondary | ICD-10-CM | POA: Diagnosis present

## 2015-01-09 DIAGNOSIS — Z7902 Long term (current) use of antithrombotics/antiplatelets: Secondary | ICD-10-CM

## 2015-01-09 DIAGNOSIS — I255 Ischemic cardiomyopathy: Secondary | ICD-10-CM | POA: Diagnosis present

## 2015-01-09 DIAGNOSIS — I5022 Chronic systolic (congestive) heart failure: Secondary | ICD-10-CM | POA: Diagnosis present

## 2015-01-09 DIAGNOSIS — R402342 Coma scale, best motor response, flexion withdrawal, at arrival to emergency department: Secondary | ICD-10-CM | POA: Diagnosis present

## 2015-01-09 DIAGNOSIS — G931 Anoxic brain damage, not elsewhere classified: Secondary | ICD-10-CM

## 2015-01-09 HISTORY — DX: Ischemic cardiomyopathy: I25.5

## 2015-01-09 HISTORY — DX: Other psychoactive substance abuse, uncomplicated: F19.10

## 2015-01-09 LAB — I-STAT ARTERIAL BLOOD GAS, ED
ACID-BASE DEFICIT: 2 mmol/L (ref 0.0–2.0)
BICARBONATE: 23.7 meq/L (ref 20.0–24.0)
O2 Saturation: 100 %
PCO2 ART: 38 mmHg (ref 35.0–45.0)
PH ART: 7.39 (ref 7.350–7.450)
TCO2: 25 mmol/L (ref 0–100)
pO2, Arterial: 435 mmHg — ABNORMAL HIGH (ref 80.0–100.0)

## 2015-01-09 LAB — GLUCOSE, CAPILLARY
GLUCOSE-CAPILLARY: 106 mg/dL — AB (ref 70–99)
GLUCOSE-CAPILLARY: 96 mg/dL (ref 70–99)
Glucose-Capillary: 178 mg/dL — ABNORMAL HIGH (ref 70–99)
Glucose-Capillary: 108 mg/dL — ABNORMAL HIGH (ref 70–99)
Glucose-Capillary: 138 mg/dL — ABNORMAL HIGH (ref 70–99)
Glucose-Capillary: 103 mg/dL — ABNORMAL HIGH (ref 70–99)

## 2015-01-09 LAB — RAPID URINE DRUG SCREEN, HOSP PERFORMED
AMPHETAMINES: NOT DETECTED
BARBITURATES: NOT DETECTED
BENZODIAZEPINES: NOT DETECTED
Cocaine: NOT DETECTED
Opiates: NOT DETECTED
TETRAHYDROCANNABINOL: NOT DETECTED

## 2015-01-09 LAB — BASIC METABOLIC PANEL
Anion gap: 16 — ABNORMAL HIGH (ref 5–15)
BUN: 8 mg/dL (ref 6–23)
CALCIUM: 9 mg/dL (ref 8.4–10.5)
CHLORIDE: 108 meq/L (ref 96–112)
CO2: 19 mmol/L (ref 19–32)
Creatinine, Ser: 1.1 mg/dL (ref 0.50–1.35)
GFR calc Af Amer: 79 mL/min — ABNORMAL LOW (ref >90)
GFR, EST NON AFRICAN AMERICAN: 68 mL/min — AB (ref >90)
Glucose, Bld: 121 mg/dL — ABNORMAL HIGH (ref 70–99)
Potassium: 3.2 mmol/L — ABNORMAL LOW (ref 3.5–5.1)
SODIUM: 143 mmol/L (ref 135–145)

## 2015-01-09 LAB — POCT I-STAT, CHEM 8
BUN: 10 mg/dL (ref 6–23)
BUN: 9 mg/dL (ref 6–23)
BUN: 8 mg/dL (ref 6–23)
BUN: 9 mg/dL (ref 6–23)
CALCIUM ION: 1.12 mmol/L — AB (ref 1.13–1.30)
CALCIUM ION: 1.04 mmol/L — AB (ref 1.13–1.30)
CALCIUM ION: 1.1 mmol/L — AB (ref 1.13–1.30)
CHLORIDE: 107 meq/L (ref 96–112)
CREATININE: 0.5 mg/dL (ref 0.50–1.35)
Calcium, Ion: 1.07 mmol/L — ABNORMAL LOW (ref 1.13–1.30)
Chloride: 106 mEq/L (ref 96–112)
Chloride: 112 mEq/L (ref 96–112)
Chloride: 116 mEq/L — ABNORMAL HIGH (ref 96–112)
Creatinine, Ser: 0.6 mg/dL (ref 0.50–1.35)
Creatinine, Ser: 0.4 mg/dL — ABNORMAL LOW (ref 0.50–1.35)
Creatinine, Ser: 0.6 mg/dL (ref 0.50–1.35)
GLUCOSE: 192 mg/dL — AB (ref 70–99)
Glucose, Bld: 188 mg/dL — ABNORMAL HIGH (ref 70–99)
Glucose, Bld: 139 mg/dL — ABNORMAL HIGH (ref 70–99)
Glucose, Bld: 106 mg/dL — ABNORMAL HIGH (ref 70–99)
HCT: 44 % (ref 39.0–52.0)
HCT: 43 % (ref 39.0–52.0)
HCT: 44 % (ref 39.0–52.0)
HCT: 43 % (ref 39.0–52.0)
HEMOGLOBIN: 15 g/dL (ref 13.0–17.0)
HEMOGLOBIN: 15 g/dL (ref 13.0–17.0)
HEMOGLOBIN: 14.6 g/dL (ref 13.0–17.0)
Hemoglobin: 14.6 g/dL (ref 13.0–17.0)
POTASSIUM: 3.7 mmol/L (ref 3.5–5.1)
POTASSIUM: 3.9 mmol/L (ref 3.5–5.1)
Potassium: 3.4 mmol/L — ABNORMAL LOW (ref 3.5–5.1)
Potassium: 3.9 mmol/L (ref 3.5–5.1)
SODIUM: 145 mmol/L (ref 135–145)
SODIUM: 143 mmol/L (ref 135–145)
SODIUM: 142 mmol/L (ref 135–145)
SODIUM: 138 mmol/L (ref 135–145)
TCO2: 26 mmol/L (ref 0–100)
TCO2: 24 mmol/L (ref 0–100)
TCO2: 23 mmol/L (ref 0–100)
TCO2: 26 mmol/L (ref 0–100)

## 2015-01-09 LAB — URINALYSIS, ROUTINE W REFLEX MICROSCOPIC
Bilirubin Urine: NEGATIVE
GLUCOSE, UA: NEGATIVE mg/dL
Ketones, ur: NEGATIVE mg/dL
NITRITE: NEGATIVE
PH: 6.5 (ref 5.0–8.0)
Protein, ur: >300 mg/dL — AB
Specific Gravity, Urine: 1.012 (ref 1.005–1.030)
Urobilinogen, UA: 1 mg/dL (ref 0.0–1.0)

## 2015-01-09 LAB — MAGNESIUM: Magnesium: 2 mg/dL (ref 1.5–2.5)

## 2015-01-09 LAB — CBC WITH DIFFERENTIAL/PLATELET
BASOS ABS: 0 10*3/uL (ref 0.0–0.1)
Basophils Relative: 0 % (ref 0–1)
EOS ABS: 0.1 10*3/uL (ref 0.0–0.7)
EOS PCT: 2 % (ref 0–5)
HCT: 39.7 % (ref 39.0–52.0)
Hemoglobin: 12.5 g/dL — ABNORMAL LOW (ref 13.0–17.0)
LYMPHS PCT: 36 % (ref 12–46)
Lymphs Abs: 2.3 10*3/uL (ref 0.7–4.0)
MCH: 28 pg (ref 26.0–34.0)
MCHC: 31.5 g/dL (ref 30.0–36.0)
MCV: 88.8 fL (ref 78.0–100.0)
MONO ABS: 0.3 10*3/uL (ref 0.1–1.0)
MONOS PCT: 5 % (ref 3–12)
NEUTROS PCT: 57 % (ref 43–77)
Neutro Abs: 3.7 10*3/uL (ref 1.7–7.7)
PLATELETS: 223 10*3/uL (ref 150–400)
RBC: 4.47 MIL/uL (ref 4.22–5.81)
RDW: 14.2 % (ref 11.5–15.5)
WBC: 6.5 10*3/uL (ref 4.0–10.5)

## 2015-01-09 LAB — URINE MICROSCOPIC-ADD ON

## 2015-01-09 LAB — I-STAT TROPONIN, ED: TROPONIN I, POC: 0.01 ng/mL (ref 0.00–0.08)

## 2015-01-09 LAB — TROPONIN I

## 2015-01-09 LAB — APTT
APTT: 35 s (ref 24–37)
aPTT: 43 seconds — ABNORMAL HIGH (ref 24–37)

## 2015-01-09 LAB — I-STAT CG4 LACTIC ACID, ED: Lactic Acid, Venous: 7.78 mmol/L — ABNORMAL HIGH (ref 0.5–2.2)

## 2015-01-09 LAB — PROTIME-INR
INR: 1.16 (ref 0.00–1.49)
INR: 1.08 (ref 0.00–1.49)
Prothrombin Time: 14.9 seconds (ref 11.6–15.2)
Prothrombin Time: 14.2 seconds (ref 11.6–15.2)

## 2015-01-09 LAB — MRSA PCR SCREENING: MRSA by PCR: NEGATIVE

## 2015-01-09 LAB — PHOSPHORUS: Phosphorus: 4.8 mg/dL — ABNORMAL HIGH (ref 2.3–4.6)

## 2015-01-09 MED ORDER — METOPROLOL TARTRATE 1 MG/ML IV SOLN
INTRAVENOUS | Status: AC
  Filled 2015-01-09: qty 5

## 2015-01-09 MED ORDER — ASPIRIN 300 MG RE SUPP
300.0000 mg | RECTAL | Status: AC
  Administered 2015-01-09: 300 mg via RECTAL
  Filled 2015-01-09: qty 1

## 2015-01-09 MED ORDER — ASPIRIN 300 MG RE SUPP: 300.0000 mg | RECTAL | Status: AC

## 2015-01-09 MED ORDER — FENTANYL CITRATE 0.05 MG/ML IJ SOLN
50.0000 ug | INTRAMUSCULAR | Status: DC | PRN
  Administered 2015-01-09 (×2): 50 ug via INTRAVENOUS

## 2015-01-09 MED ORDER — HEPARIN SODIUM (PORCINE) 5000 UNIT/ML IJ SOLN
5000.0000 [IU] | Freq: Three times a day (TID) | INTRAMUSCULAR | Status: DC
  Administered 2015-01-09 – 2015-01-11 (×7): 5000 [IU] via SUBCUTANEOUS
  Filled 2015-01-09 (×12): qty 1

## 2015-01-09 MED ORDER — SODIUM CHLORIDE 0.9 % IV SOLN
INTRAVENOUS | Status: DC
  Administered 2015-01-09: 17:00:00 via INTRAVENOUS

## 2015-01-09 MED ORDER — METOPROLOL TARTRATE 1 MG/ML IV SOLN
5.0000 mg | INTRAVENOUS | Status: DC
  Administered 2015-01-09 – 2015-01-11 (×4): 5 mg via INTRAVENOUS
  Filled 2015-01-09 (×15): qty 5

## 2015-01-09 MED ORDER — LABETALOL HCL 5 MG/ML IV SOLN
10.0000 mg | Freq: Once | INTRAVENOUS | Status: AC
  Administered 2015-01-09: 10 mg via INTRAVENOUS
  Filled 2015-01-09: qty 4

## 2015-01-09 MED ORDER — SODIUM CHLORIDE 0.9 % IV SOLN: 2000.0000 mL | Freq: Once | INTRAVENOUS | Status: AC

## 2015-01-09 MED ORDER — POTASSIUM CHLORIDE 10 MEQ/50ML IV SOLN
10.0000 meq | INTRAVENOUS | Status: AC
Start: 2015-01-09 — End: 2015-01-09
  Administered 2015-01-09 (×3): 10 meq via INTRAVENOUS
  Filled 2015-01-09 (×2): qty 50

## 2015-01-09 MED ORDER — SODIUM CHLORIDE 0.9 % IV BOLUS (SEPSIS)
1000.0000 mL | Freq: Once | INTRAVENOUS | Status: AC
  Administered 2015-01-09: 1000 mL via INTRAVENOUS

## 2015-01-09 MED ORDER — PANTOPRAZOLE SODIUM 40 MG IV SOLR
40.0000 mg | INTRAVENOUS | Status: DC
  Administered 2015-01-09 – 2015-01-11 (×3): 40 mg via INTRAVENOUS
  Filled 2015-01-09 (×3): qty 40

## 2015-01-09 MED ORDER — CISATRACURIUM BOLUS VIA INFUSION
0.0500 mg/kg | INTRAVENOUS | Status: DC | PRN
  Filled 2015-01-09: qty 4

## 2015-01-09 MED ORDER — NICARDIPINE HCL IN NACL 20-0.86 MG/200ML-% IV SOLN
3.0000 mg/h | INTRAVENOUS | Status: DC
  Administered 2015-01-09: 10 mg/h via INTRAVENOUS
  Administered 2015-01-09: 5 mg/h via INTRAVENOUS
  Administered 2015-01-09: 2 mg/h via INTRAVENOUS
  Filled 2015-01-09 (×4): qty 200

## 2015-01-09 MED ORDER — SODIUM CHLORIDE 0.9 % IV SOLN
1.0000 mg/h | INTRAVENOUS | Status: DC
  Administered 2015-01-09 (×2): 5 mg/h via INTRAVENOUS
  Administered 2015-01-10 (×2): 3 mg/h via INTRAVENOUS
  Filled 2015-01-09 (×5): qty 10

## 2015-01-09 MED ORDER — ETOMIDATE 2 MG/ML IV SOLN
INTRAVENOUS | Status: DC | PRN
  Administered 2015-01-09: 20 mg via INTRAVENOUS

## 2015-01-09 MED ORDER — CISATRACURIUM BOLUS VIA INFUSION
0.1000 mg/kg | Freq: Once | INTRAVENOUS | Status: AC
  Administered 2015-01-09: 6.8 mg via INTRAVENOUS
  Filled 2015-01-09: qty 7

## 2015-01-09 MED ORDER — DEXTROSE 5 % IV SOLN
0.0000 ug/min | INTRAVENOUS | Status: DC
  Administered 2015-01-10: 5 ug/min via INTRAVENOUS
  Filled 2015-01-09: qty 4

## 2015-01-09 MED ORDER — CISATRACURIUM BESYLATE 10 MG/ML IV SOLN
1.0000 ug/kg/min | INTRAVENOUS | Status: DC
  Administered 2015-01-09 (×2): 1 ug/kg/min via INTRAVENOUS
  Administered 2015-01-10: 1.495 ug/kg/min via INTRAVENOUS
  Filled 2015-01-09 (×2): qty 20

## 2015-01-09 MED ORDER — ARTIFICIAL TEARS OP OINT
1.0000 "application " | TOPICAL_OINTMENT | Freq: Three times a day (TID) | OPHTHALMIC | Status: DC
  Administered 2015-01-09 – 2015-01-11 (×5): 1 via OPHTHALMIC
  Filled 2015-01-09: qty 3.5

## 2015-01-09 MED ORDER — SODIUM CHLORIDE 0.9 % IV SOLN
2000.0000 mL | Freq: Once | INTRAVENOUS | Status: AC
  Administered 2015-01-09: 2000 mL via INTRAVENOUS

## 2015-01-09 MED ORDER — SODIUM CHLORIDE 0.9 % IV SOLN
25.0000 ug/h | INTRAVENOUS | Status: DC
  Administered 2015-01-09: 200 ug/h via INTRAVENOUS
  Administered 2015-01-09: 75 ug/h via INTRAVENOUS
  Administered 2015-01-10 (×2): 200 ug/h via INTRAVENOUS
  Filled 2015-01-09 (×4): qty 50

## 2015-01-09 MED ORDER — CETYLPYRIDINIUM CHLORIDE 0.05 % MT LIQD
7.0000 mL | Freq: Four times a day (QID) | OROMUCOSAL | Status: DC
  Administered 2015-01-10 – 2015-01-11 (×8): 7 mL via OROMUCOSAL

## 2015-01-09 MED ORDER — CHLORHEXIDINE GLUCONATE 0.12 % MT SOLN
15.0000 mL | Freq: Two times a day (BID) | OROMUCOSAL | Status: DC
  Administered 2015-01-09 – 2015-01-11 (×5): 15 mL via OROMUCOSAL
  Filled 2015-01-09 (×5): qty 15

## 2015-01-09 MED ORDER — FENTANYL CITRATE 0.05 MG/ML IJ SOLN: 50.0000 ug | INTRAMUSCULAR | Status: DC | PRN

## 2015-01-09 MED ORDER — ROCURONIUM BROMIDE 50 MG/5ML IV SOLN
INTRAVENOUS | Status: DC | PRN
  Administered 2015-01-09: 70 mg via INTRAVENOUS

## 2015-01-09 MED ORDER — FENTANYL CITRATE 0.05 MG/ML IJ SOLN
INTRAMUSCULAR | Status: AC
Start: 2015-01-09 — End: 2015-01-09
  Filled 2015-01-09: qty 2

## 2015-01-09 NOTE — Progress Notes (Signed)
eLink Physician-Brief Progress Note Patient Name: Scott EstimableJames E Homer DOB: 02/28/1946 MRN: [id number]   Date of Service  01/09/2015  HPI/Events of Note  Afib RVR  eICU Interventions  Start lopressor on schedule     Intervention Category Major Interventions: Arrhythmia - evaluation and management  Shan Levansatrick Roald Lukacs 01/09/2015, 5:16 PM

## 2015-01-09 NOTE — Progress Notes (Signed)
  Echocardiogram 2D Echocardiogram has been performed.  Leta JunglingCooper, Jaxon Mynhier M 01/09/2015, 4:21 PM

## 2015-01-09 NOTE — ED Notes (Signed)
Lactic acid results given to Dr. Zavitz 

## 2015-01-09 NOTE — Procedures (Signed)
Arterial Catheter Insertion Procedure Note [name] [id number] Feb 03, 1946  Procedure: Insertion of Arterial Catheter  Indications: Blood pressure monitoring and Frequent blood sampling  Procedure Details Consent: Unable to obtain consent because of altered level of consciousness. Time Out: Verified patient identification, verified procedure, site/side was marked, verified correct patient position, special equipment/implants available, medications/allergies/relevent history reviewed, required imaging and test results available.  Performed  Maximum sterile technique was used including antiseptics, cap, gloves, gown, hand hygiene, mask and sheet. Skin prep: Chlorhexidine; local anesthetic administered 20 gauge catheter was inserted into right radial artery using the Seldinger technique.  Evaluation Blood flow good; BP tracing good. Complications: No apparent complications.   [name],[name] 01/09/2015

## 2015-01-09 NOTE — ED Notes (Signed)
CARDIOLOGY AT BEDSIDE

## 2015-01-09 NOTE — Progress Notes (Signed)
eLink Physician-Brief Progress Note Patient Name: Skip EstimableJames E Zenner DOB: 11-May-1946 MRN: [id number]   Date of Service  01/09/2015  HPI/Events of Note  Needs PPI for SUP  eICU Interventions  PPI IV ordred      Intervention Category Intermediate Interventions: Best-practice therapies (e.g. DVT, beta blocker, etc.)  Shan LevansPatrick Corey Laski 01/09/2015, 5:09 PM

## 2015-01-09 NOTE — Procedures (Signed)
Central Venous Catheter Insertion Procedure Note Skip EstimableJames E Gettinger [id number] [birth year]  Procedure: Insertion of Central Venous Catheter Indications: Assessment of intravascular volume, Drug and/or fluid administration and Frequent blood sampling  Procedure Details Consent: Unable to obtain consent because of emergent medical necessity. Time Out: Verified patient identification, verified procedure, site/side was marked, verified correct patient position, special equipment/implants available, medications/allergies/relevent history reviewed, required imaging and test results available.  Performed  Maximum sterile technique was used including antiseptics, cap, gloves, gown, hand hygiene, mask and sheet. Skin prep: Chlorhexidine; local anesthetic administered A antimicrobial bonded/coated triple lumen catheter was placed in the right internal jugular vein using the Seldinger technique.  Evaluation Blood flow good Complications: No apparent complications Patient did tolerate procedure well. Chest X-ray ordered to verify placement.  CXR: pending.  U/S used in placement.  Abriel Geesey 01/09/2015, 3:05 PM

## 2015-01-09 NOTE — ED Notes (Signed)
Per EMS- Pt was walking down the street with friend reported her started feeling bad, sat down then collapsed. 15 mins downtime before CPR was started by EMS. CPR was started at 1305 by EMS. Initial rhythm was vfib, defibrilated x 4. ROSC at 1315 with ST HR 153. CBG 153. BP 190/110, capnography of 28. Given 1 nacan, 1 epi. Has L. EJ.

## 2015-01-09 NOTE — Progress Notes (Signed)
Patient transported from ED to 2H01 without event. Patient tolerated well. Vital signs stable throughout. RN at bedside. RT will continue to monitor.

## 2015-01-09 NOTE — Progress Notes (Signed)
Pt. transported to CT from Trauma-A then returned, uneventful transport, RT to monitor.

## 2015-01-09 NOTE — ED Notes (Signed)
Critical care at bedside placing central line 

## 2015-01-09 NOTE — H&P (Signed)
PULMONARY / CRITICAL CARE MEDICINE   Name: [name] MRN: [id number] DOB: 1946/12/19    ADMISSION DATE:  01/09/2015 CONSULTATION DATE:  01/09/2015  REFERRING MD :  EDP  CHIEF COMPLAINT:  Cardiac arrest  INITIAL PRESENTATION: 69 year old male with history of recent VF arrest in 10/2014 2nd to RCA lesion. Presents again to ED 1/18 with VF arrest. ROSC after 8 mins ACLS. Total downtime ~20 mins. PCCM to admit.   STUDIES:   SIGNIFICANT EVENTS: 11/5 - 11/14 > admission for VF arrest, underwent hypothermia and PCI to RCA  HISTORY OF PRESENT ILLNESS:  69 year old male with PMH as below, which includes VF arrest 2nd to MI in 10/2014. He was taken to cath lab and underwent PCI to occluded RCA. He was intubated in ICU on hypothermia protocol for several days. Resultant encephalopathy and cardiomyopathy. 1/18 he presented to [facility] after a witnessed arrest. 15 mins downtime prior to EMS arrival. ACLS was started. Initial rythym was VF. ROSC was achieved after 10 minutes ACLS with defibrillation x 4. Sinus tach. He was intubated in ED. PCCM to admit.   PAST MEDICAL HISTORY :   has a past medical history of Smoker; Cardiac arrest; and MI (myocardial infarction).  has past surgical history that includes left heart catheterization with coronary angiogram (N/A, 10/27/2014). Prior to Admission medications   Medication Sig Start Date End Date Taking? Authorizing Provider  amiodarone (PACERONE) 200 MG tablet Take 1 tablet (200 mg total) by mouth daily. 11/15/14   Richarda Overlie, MD  amiodarone (PACERONE) 400 MG tablet Take 1 tablet (400 mg total) by mouth 2 (two) times daily. 11/05/14 11/08/14  Richarda Overlie, MD  amiodarone (PACERONE) 400 MG tablet Take 1 tablet (400 mg total) by mouth daily. 11/08/14 11/14/14  Richarda Overlie, MD  aspirin 81 MG chewable tablet Chew 1 tablet (81 mg total) by mouth daily. 11/04/14   Richarda Overlie, MD  atorvastatin (LIPITOR) 80 MG tablet Take 1 tablet (80 mg total) by mouth daily  at 6 PM. 11/04/14   Richarda Overlie, MD  carvedilol (COREG) 6.25 MG tablet Take 1 tablet (6.25 mg total) by mouth 2 (two) times daily with a meal. 11/04/14   Richarda Overlie, MD  clonazePAM (KLONOPIN) 0.5 MG tablet Take 1 tablet (0.5 mg total) by mouth 2 (two) times daily. 11/05/14   Richarda Overlie, MD  divalproex (DEPAKOTE ER) 500 MG 24 hr tablet Take 1 tablet (500 mg total) by mouth 2 (two) times daily. 11/04/14   Richarda Overlie, MD  ipratropium-albuterol (DUONEB) 0.5-2.5 (3) MG/3ML SOLN Take 3 mLs by nebulization every 6 (six) hours as needed. 11/04/14   Richarda Overlie, MD  lisinopril (PRINIVIL,ZESTRIL) 2.5 MG tablet Take 1 tablet (2.5 mg total) by mouth daily. 11/04/14   Richarda Overlie, MD  pantoprazole (PROTONIX) 40 MG tablet Take 1 tablet (40 mg total) by mouth daily at 12 noon. 11/04/14   Richarda Overlie, MD  QUEtiapine (SEROQUEL) 25 MG tablet Take 1 tablet (25 mg total) by mouth 2 (two) times daily. 11/04/14   Richarda Overlie, MD  ticagrelor (BRILINTA) 90 MG TABS tablet Take 1 tablet (90 mg total) by mouth 2 (two) times daily. 11/05/14   Richarda Overlie, MD  ziprasidone (GEODON) injection Inject 10 mg into the muscle 4 (four) times daily as needed for agitation (maximum dose 20 mg for 24 hours only.). 11/04/14   Richarda Overlie, MD   Allergies  Allergen Reactions  . Aspirin Nausea And Vomiting    FAMILY HISTORY:  has no family status information on file.  SOCIAL HISTORY:  reports that he has been smoking Cigarettes.  He does not have any smokeless tobacco history on file.  REVIEW OF SYSTEMS:  unable  SUBJECTIVE:   VITAL SIGNS: Temp:  [96.1 F (35.6 C)] 96.1 F (35.6 C) (01/18 1347) Pulse Rate:  [76-118] 118 (01/18 1357) Resp:  [16-22] 16 (01/18 1357) BP: (133)/(98) 133/98 mmHg (01/18 1343) SpO2:  [96 %-100 %] 100 % (01/18 1357) FiO2 (%):  [100 %] 100 % (01/18 1357) HEMODYNAMICS:   VENTILATOR SETTINGS: Vent Mode:  [-] PRVC FiO2 (%):  [100 %] 100 % Set Rate:  [16 bmp] 16 bmp Vt Set:  [600 mL] 600  mL Plateau Pressure:  [16 cmH20] 16 cmH20 INTAKE / OUTPUT: No intake or output data in the 24 hours ending 01/09/15 1403  PHYSICAL EXAMINATION: General:  Thin black male on vent Neuro:  Obtunded HEENT:  [place]/AT, No JVD noted.  Cardiovascular:  Tachy, irreg irreg. No MRG noted Lungs:  Coarse breath sounds R>L Abdomen:  Soft, non-distended Musculoskeletal:  No acute deformity Skin:  Skin is dry, flaky lower extremities.   LABS:  CBC No results for input(s): WBC, HGB, HCT, PLT in the last 168 hours. Coag's No results for input(s): APTT, INR in the last 168 hours. BMET No results for input(s): NA, K, CL, CO2, BUN, CREATININE, GLUCOSE in the last 168 hours. Electrolytes No results for input(s): CALCIUM, MG, PHOS in the last 168 hours. Sepsis Markers No results for input(s): LATICACIDVEN, PROCALCITON, O2SATVEN in the last 168 hours. ABG No results for input(s): PHART, PCO2ART, PO2ART in the last 168 hours. Liver Enzymes No results for input(s): AST, ALT, ALKPHOS, BILITOT, ALBUMIN in the last 168 hours. Cardiac Enzymes No results for input(s): TROPONINI, PROBNP in the last 168 hours. Glucose No results for input(s): GLUCAP in the last 168 hours.  Imaging No results found.   ASSESSMENT / PLAN:  PULMONARY OETT 1/18>>> A:  Acute hypoxemic respiratory failure  P:   Full vent support. Follow ABG/CXR. VAP prevention per protocol.  CARDIOVASCULAR CVL 1/18 >>> A:  VF arrest Ischemic cardiomyopathy - LVEF 25% CAD Hypertensive Emergency  P:  Telemetry monitoring MAP goal > 85 mm/Hg SBP goal < 160 PRN labetalol to achieve SBP goal Therapeutic hypothermia Insert Art line, CVL CVP monitoring Cards following, no cath lab at this time.   RENAL A:   Metabolic acidosis, lactic  P:   Follow Bmet  GASTROINTESTINAL A:   No acute issues  P:   NPO IV Protonix for SUP  HEMATOLOGIC A:   Anemia, chronic  P:  Follow CBC  INFECTIOUS A:   No acute issues  P:    Monitor clinically  ENDOCRINE A:   No acute issues   P:   CBG monitoring and SSI  NEUROLOGIC A:   Acute encephalopathy Cocaine abuse  P:   RASS goal: -5 Versed gtt, fentanyl gtt, Nimbex gtt per hypothermia protocol UDS pending Monitor   FAMILY  - Updates:   - Inter-disciplinary family meet or Palliative Care meeting due by: 1/25   Joneen Roach, AGACNP-BC [place] [facility]/Critical Care Pager [phone] or [phone]  68 year hold homeless non compliant man with history of cocaine abuse presenting today as a witnessed arrest, no CPR x15 minutes.  ROSC.  Now hypertensive and tachycardic.  Head CT negative.  No evidence of infection/aspiration.  Not coagulopathic.  UDS pending.  Cooling candidate.  Will place TLC and hypothermia orders.  Cards input appreciated.  The patient is critically ill with multiple organ systems failure and requires high complexity decision making for assessment and support, frequent evaluation and titration of therapies, application of advanced monitoring technologies and extensive interpretation of multiple databases.   Critical Care Time devoted to patient care services described in this note is  45  Minutes. This time reflects time of care of this signee Dr Koren BoundWesam [name]. This critical care time does not reflect procedure time, or teaching time or supervisory time of PA/NP/Med student/Med Resident etc but could involve care discussion time.  Alyson ReedyWesam G. [name], M.D. [facility]. Pager: [phone]. After hours pager: [phone].

## 2015-01-09 NOTE — Progress Notes (Signed)
EEG completed, results pending. 

## 2015-01-09 NOTE — Procedures (Signed)
ELECTROENCEPHALOGRAM REPORT   Patient: [name]       Room #: 2H-01 EEG No. ID:  Age: 69 y.o.        Sex: male Referring Physician: Dr Molli KnockYacoub Report Date:  01/09/2015        Interpreting Physician: Elspeth ChoSUMNER, Garland Hincapie, Jill AlexandersJUSTIN  History: [name] is an 69 y.o. male with history of recent VF arrest in 10/2014 2nd to RCA lesion. Presents again to ED 1/18 with VF arrest. ROSC after 8 mins ACLS. Total downtime ~20 mins. EEG performed during hypothermia protocol.  Medications:  Scheduled: . [START ON 01/10/2015] antiseptic oral rinse  7 mL Mouth Rinse QID  . artificial tears  1 application Both Eyes 3 times per day  . aspirin  300 mg Rectal NOW  . chlorhexidine  15 mL Mouth Rinse BID  . heparin  5,000 Units Subcutaneous 3 times per day  . metoprolol  5 mg Intravenous Q4H  . pantoprazole (PROTONIX) IV  40 mg Intravenous Q24H  . potassium chloride  10 mEq Intravenous Q1 Hr x 3    Conditions of Recording:  This is a 16 channel EEG carried out with the patient in the unresponsive state state.  Description:  The background activity consists predominantly of a low voltage slow activity in the theta range. There is a poorly sustained posterior rhythm in the 9-10 Hz alpha rhythm. Low voltage fast activity, poorly organized, is seen anteriorly and is at times superimposed on more posterior regions.  A majority of theta rhythms are seen from the central and temporal regions. No focal slowing, no sharp wave or seizure activity is noted. No clear deliniation of awake vs sleep is noted.   Hyperventilation and photic stimulation was not performed.    IMPRESSION: Normal EEG performed during hypothermia protocol.    Elspeth Choeter Maxfield Gildersleeve, DO [facility] [phone]  If 7pm- 7am, please page neurology on call as listed in AMION. 01/09/2015, 6:47 PM

## 2015-01-09 NOTE — ED Notes (Signed)
ICE APPLIED TO GROIN AND AXILLA. COOL SALINE INFUSING.

## 2015-01-09 NOTE — ED Provider Notes (Addendum)
CSN: [id number]     Arrival date & time 01/09/15  1323 History   First MD Initiated Contact with Patient 01/09/15 1345     Chief Complaint  Patient presents with  . Cardiac Arrest     (Consider location/radiation/quality/duration/timing/severity/associated sxs/prior Treatment) HPI Comments: 69 year old male with history of recent ventricular fibrillation/inferior ST elevation heart attack in November, hypoxicischemic encephalopathy, heart failure, pneumonia, atrial fibrillation presents after witnessed syncope/cardiac arrest. EMS found patient fidget with fibrillation, with epinephrine and shocks they were able to get a pulse back. Patient was intubated on scene however patient began having gag reflex so they pulled the temporary breathing 2. Blood pressure was elevated per EMS. Patient had approximate 8 minutes without CPR and 12 minutes of resuscitation before return of spontaneous circulation.  The history is provided by the patient.    Past Medical History  Diagnosis Date  . Smoker   . Cardiac arrest     10/2014  . MI (myocardial infarction)     10/2014 - PCI to RCA  . Ischemic cardiomyopathy   . Polysubstance abuse    Past Surgical History  Procedure Laterality Date  . Left heart catheterization with coronary angiogram N/A 10/27/2014    Procedure: LEFT HEART CATHETERIZATION WITH CORONARY ANGIOGRAM;  Surgeon: Kathleene Hazel, MD;  Location: [facility] CATH LAB;  Service: Cardiovascular;  Laterality: N/A;   History reviewed. No pertinent family history. History  Substance Use Topics  . Smoking status: Current Every Day Smoker -- 1.00 packs/day for 35 years    Types: Cigarettes  . Smokeless tobacco: Not on file  . Alcohol Use: Not on file    Review of Systems  Unable to perform ROS     Allergies  Aspirin  Home Medications   Prior to Admission medications   Medication Sig Start Date End Date Taking? Authorizing Provider  amiodarone (PACERONE) 200 MG tablet Take 1  tablet (200 mg total) by mouth daily. 11/15/14   Richarda Overlie, MD  amiodarone (PACERONE) 400 MG tablet Take 1 tablet (400 mg total) by mouth 2 (two) times daily. 11/05/14 11/08/14  Richarda Overlie, MD  amiodarone (PACERONE) 400 MG tablet Take 1 tablet (400 mg total) by mouth daily. 11/08/14 11/14/14  Richarda Overlie, MD  aspirin 81 MG chewable tablet Chew 1 tablet (81 mg total) by mouth daily. 11/04/14   Richarda Overlie, MD  atorvastatin (LIPITOR) 80 MG tablet Take 1 tablet (80 mg total) by mouth daily at 6 PM. 11/04/14   Richarda Overlie, MD  carvedilol (COREG) 6.25 MG tablet Take 1 tablet (6.25 mg total) by mouth 2 (two) times daily with a meal. 11/04/14   Richarda Overlie, MD  clonazePAM (KLONOPIN) 0.5 MG tablet Take 1 tablet (0.5 mg total) by mouth 2 (two) times daily. 11/05/14   Richarda Overlie, MD  divalproex (DEPAKOTE ER) 500 MG 24 hr tablet Take 1 tablet (500 mg total) by mouth 2 (two) times daily. 11/04/14   Richarda Overlie, MD  ipratropium-albuterol (DUONEB) 0.5-2.5 (3) MG/3ML SOLN Take 3 mLs by nebulization every 6 (six) hours as needed. 11/04/14   Richarda Overlie, MD  lisinopril (PRINIVIL,ZESTRIL) 2.5 MG tablet Take 1 tablet (2.5 mg total) by mouth daily. 11/04/14   Richarda Overlie, MD  pantoprazole (PROTONIX) 40 MG tablet Take 1 tablet (40 mg total) by mouth daily at 12 noon. 11/04/14   Richarda Overlie, MD  QUEtiapine (SEROQUEL) 25 MG tablet Take 1 tablet (25 mg total) by mouth 2 (two) times daily. 11/04/14   Richarda Overlie, MD  ticagrelor (BRILINTA) 90 MG TABS tablet Take 1 tablet (90 mg total) by mouth 2 (two) times daily. 11/05/14   Richarda Overlie, MD  ziprasidone (GEODON) injection Inject 10 mg into the muscle 4 (four) times daily as needed for agitation (maximum dose 20 mg for 24 hours only.). 11/04/14   Richarda Overlie, MD   BP 96/65 mmHg  Pulse 84  Temp(Src) 91.2 F (32.9 C) (Core (Comment))  Resp 16  Ht 6\' 2"  (1.88 m)  Wt 147 lb 15.2 oz (67.11 kg)  BMI 18.99 kg/m2  SpO2 100% Physical Exam  Constitutional: He  appears well-developed.  HENT:  Head: Normocephalic.  Increased sputum at lips  Eyes: No scleral icterus.  Pupils equal not responsive  Neck: Neck supple.  Cardiovascular: Regular rhythm.  Tachycardia present.   Pulmonary/Chest: He is in respiratory distress. He has rales (few at bases bilateral).  Agonal respirations  Abdominal: He exhibits distension. There is no guarding.  Musculoskeletal: He exhibits no edema.  Neurological: GCS eye subscore is 2. GCS verbal subscore is 2. GCS motor subscore is 3.  Patient has  agonal respirations, flexed bilateral arms, no purposeful movement, nonverbal, withdraws to pain.   Skin: He is diaphoretic. Erythema: dry skin, cool.  Psychiatric:  Unresponsive to verbal  Nursing note and vitals reviewed.   ED Course  Procedures (including critical care time)   EMERGENCY DEPARTMENT [place] CARDIAC EXAM "Study: Limited Ultrasound of the heart and pericardium"  INDICATIONS:Cardiac arrest Multiple views of the heart and pericardium were obtained in real-time with a multi-frequency probe.  PERFORMED ZO:[id number]  IMAGES ARCHIVED?: Yes  FINDINGS: No pericardial effusion, Decreased contractility and Tamponade physiology absent  LIMITATIONS:  Emergent procedure  VIEWS USED: Parasternal long axis and Parasternal short axis  INTERPRETATION: Cardiac activity present, Cardiac tamponade absent and Decreased contractility  CRITICAL CARE Performed by: Enid Skeens   Total critical care time: 35 min  Critical care time was exclusive of separately billable procedures and treating other patients.  Critical care was necessary to treat or prevent imminent or life-threatening deterioration.  Critical care was time spent personally by me on the following activities: development of treatment plan with patient and/or surrogate as well as nursing, discussions with consultants, evaluation of patient's response to treatment, examination of patient, obtaining history  from patient or surrogate, ordering and performing treatments and interventions, ordering and review of laboratory studies, ordering and review of radiographic studies, pulse oximetry and re-evaluation of patient's condition.  INTUBATION Performed by: ED resident physician, I was there during procedure  Required items: required blood products, implants, devices, and special equipment available Patient identity confirmed: provided demographic data and hospital-assigned identification number Time out: Immediately prior to procedure a "time out" was called to verify the correct patient, procedure, equipment, support staff.  Indications: arrest/ ams Intubation method: direct Preoxygenation: BVM Sedatives: Etomidate Paralytic: rocuronium  Tube Size: 7.5 cuffed  Post-procedure assessment: chest rise and ETCO2 monitor Breath sounds: equal and absent over the epigastrium Tube secured with: ETT holder Chest x-ray interpreted by me.  Chest x-ray findings: endotracheal tube in appropriate position  Patient tolerated the procedure well with no immediate complications.  EKG reviewed heart rate 127, sinus tachycardia, PVC, inferior Q waves/old infarct, prolonged QT, ST depression V3, no acute ST elevation. Labs Review Labs Reviewed  CBC WITH DIFFERENTIAL - Abnormal; Notable for the following:    Hemoglobin 12.5 (*)    All other components within normal limits  BASIC METABOLIC PANEL - Abnormal; Notable for the following:  Potassium 3.2 (*)    Glucose, Bld 121 (*)    GFR calc non Af Amer 68 (*)    GFR calc Af Amer 79 (*)    Anion gap 16 (*)    All other components within normal limits  URINALYSIS, ROUTINE W REFLEX MICROSCOPIC - Abnormal; Notable for the following:    APPearance CLOUDY (*)    Hgb urine dipstick MODERATE (*)    Protein, ur >300 (*)    Leukocytes, UA TRACE (*)    All other components within normal limits  TROPONIN I - Abnormal; Notable for the following:    Troponin I  0.19 (*)    All other components within normal limits  APTT - Abnormal; Notable for the following:    aPTT 43 (*)    All other components within normal limits  PHOSPHORUS - Abnormal; Notable for the following:    Phosphorus 4.8 (*)    All other components within normal limits  URINE MICROSCOPIC-ADD ON - Abnormal; Notable for the following:    Bacteria, UA FEW (*)    All other components within normal limits  CBC - Abnormal; Notable for the following:    HCT 38.7 (*)    All other components within normal limits  BLOOD GAS, ARTERIAL - Abnormal; Notable for the following:    pH, Arterial 7.473 (*)    pCO2 arterial 33.2 (*)    pO2, Arterial 424.0 (*)    All other components within normal limits  GLUCOSE, CAPILLARY - Abnormal; Notable for the following:    Glucose-Capillary 178 (*)    All other components within normal limits  TROPONIN I - Abnormal; Notable for the following:    Troponin I 0.13 (*)    All other components within normal limits  GLUCOSE, CAPILLARY - Abnormal; Notable for the following:    Glucose-Capillary 108 (*)    All other components within normal limits  GLUCOSE, CAPILLARY - Abnormal; Notable for the following:    Glucose-Capillary 138 (*)    All other components within normal limits  GLUCOSE, CAPILLARY - Abnormal; Notable for the following:    Glucose-Capillary 103 (*)    All other components within normal limits  GLUCOSE, CAPILLARY - Abnormal; Notable for the following:    Glucose-Capillary 106 (*)    All other components within normal limits  BASIC METABOLIC PANEL - Abnormal; Notable for the following:    Potassium 3.4 (*)    Glucose, Bld 104 (*)    All other components within normal limits  BASIC METABOLIC PANEL - Abnormal; Notable for the following:    Potassium 3.2 (*)    All other components within normal limits  BASIC METABOLIC PANEL - Abnormal; Notable for the following:    Glucose, Bld 101 (*)    All other components within normal limits  I-STAT  CG4 LACTIC ACID, ED - Abnormal; Notable for the following:    Lactic Acid, Venous 7.78 (*)    All other components within normal limits  I-STAT ARTERIAL BLOOD GAS, ED - Abnormal; Notable for the following:    pO2, Arterial 435.0 (*)    All other components within normal limits  POCT I-STAT, CHEM 8 - Abnormal; Notable for the following:    Potassium 3.4 (*)    Glucose, Bld 192 (*)    Calcium, Ion 1.12 (*)    All other components within normal limits  POCT I-STAT, CHEM 8 - Abnormal; Notable for the following:    Glucose, Bld 188 (*)  Calcium, Ion 1.04 (*)    All other components within normal limits  POCT I-STAT, CHEM 8 - Abnormal; Notable for the following:    Creatinine, Ser 0.40 (*)    Glucose, Bld 139 (*)    Calcium, Ion 1.07 (*)    All other components within normal limits  POCT I-STAT, CHEM 8 - Abnormal; Notable for the following:    Chloride 116 (*)    Glucose, Bld 106 (*)    Calcium, Ion 1.10 (*)    All other components within normal limits  MRSA PCR SCREENING  PROTIME-INR  APTT  URINE RAPID DRUG SCREEN (HOSP PERFORMED)  TROPONIN I  PROTIME-INR  MAGNESIUM  MAGNESIUM  PHOSPHORUS  GLUCOSE, CAPILLARY  BASIC METABOLIC PANEL  GLUCOSE, CAPILLARY  LACTIC ACID, PLASMA  GLUCOSE, CAPILLARY  GLUCOSE, CAPILLARY  GLUCOSE, CAPILLARY  GLUCOSE, CAPILLARY  GLUCOSE, CAPILLARY  GLUCOSE, CAPILLARY  GLUCOSE, CAPILLARY  BASIC METABOLIC PANEL  BASIC METABOLIC PANEL  BASIC METABOLIC PANEL  MAGNESIUM  PHOSPHORUS  CBC  I-STAT TROPOININ, ED    Imaging Review Ct Head Wo Contrast  01/09/2015   CLINICAL DATA:  Initial encounter for cardiac arrest.  EXAM: CT HEAD WITHOUT CONTRAST  CT CERVICAL SPINE WITHOUT CONTRAST  TECHNIQUE: Multidetector CT imaging of the head and cervical spine was performed following the standard protocol without intravenous contrast. Multiplanar CT image reconstructions of the cervical spine were also generated.  COMPARISON:  None.  FINDINGS: CT HEAD FINDINGS   Sinuses/Soft tissues: Minimal mucosal thickening of ethmoid air cells and left sphenoid sinus. Clear mastoid air cells.  Intracranial: No mass lesion, hemorrhage, hydrocephalus, acute infarct, intra-axial, or extra-axial fluid collection.  CT CERVICAL SPINE FINDINGS  Spinal visualization through the bottom of T2. Prevertebral soft tissues not well evaluated secondary to endotracheal and nasogastric tubes. Mild loss of vertebral body height at C4 through C6. Likely within normal variation/chronic. Secretions in the nasopharynx and oral pharynx are also likely related to endotracheal tube in place. No apical pneumothorax. Biapical centrilobular and paraseptal emphysema.  Advanced cervical spondylosis. This results in areas of the central canal stenosis at C4-5 through C6-7. Bilateral neural foraminal narrowing at C5-6 and C6-7. Straightening of expected lordosis.  Skull base intact. Facets are well-aligned. Coronal reformats demonstrate only degenerative irregularity about the C1-2 articulation.  IMPRESSION: 1.  No acute intracranial abnormality. 2. Advanced cervical spondylosis, without acute finding. 3. Degraded evaluation for prevertebral soft tissue swelling, secondary to endotracheal tube in place. 4. Straightening of expected cervical lordosis could be positional, due to muscular spasm, or ligamentous injury. 5. Sinus disease   Electronically Signed   By: Jeronimo Greaves M.D.   On: 01/09/2015 15:03   Ct Cervical Spine Wo Contrast  01/09/2015   CLINICAL DATA:  Initial encounter for cardiac arrest.  EXAM: CT HEAD WITHOUT CONTRAST  CT CERVICAL SPINE WITHOUT CONTRAST  TECHNIQUE: Multidetector CT imaging of the head and cervical spine was performed following the standard protocol without intravenous contrast. Multiplanar CT image reconstructions of the cervical spine were also generated.  COMPARISON:  None.  FINDINGS: CT HEAD FINDINGS  Sinuses/Soft tissues: Minimal mucosal thickening of ethmoid air cells and left  sphenoid sinus. Clear mastoid air cells.  Intracranial: No mass lesion, hemorrhage, hydrocephalus, acute infarct, intra-axial, or extra-axial fluid collection.  CT CERVICAL SPINE FINDINGS  Spinal visualization through the bottom of T2. Prevertebral soft tissues not well evaluated secondary to endotracheal and nasogastric tubes. Mild loss of vertebral body height at C4 through C6. Likely within normal variation/chronic. Secretions  in the nasopharynx and oral pharynx are also likely related to endotracheal tube in place. No apical pneumothorax. Biapical centrilobular and paraseptal emphysema.  Advanced cervical spondylosis. This results in areas of the central canal stenosis at C4-5 through C6-7. Bilateral neural foraminal narrowing at C5-6 and C6-7. Straightening of expected lordosis.  Skull base intact. Facets are well-aligned. Coronal reformats demonstrate only degenerative irregularity about the C1-2 articulation.  IMPRESSION: 1.  No acute intracranial abnormality. 2. Advanced cervical spondylosis, without acute finding. 3. Degraded evaluation for prevertebral soft tissue swelling, secondary to endotracheal tube in place. 4. Straightening of expected cervical lordosis could be positional, due to muscular spasm, or ligamentous injury. 5. Sinus disease   Electronically Signed   By: Jeronimo GreavesKyle  Talbot M.D.   On: 01/09/2015 15:03   Dg Chest Port 1 View  01/10/2015   CLINICAL DATA:  Hypoxia  EXAM: PORTABLE CHEST - 1 VIEW  COMPARISON:  January 09, 2015  FINDINGS: Endotracheal tube tip is 5.1 cm above the carina. Central catheter tip is in the superior vena cava. Nasogastric tube tip and side port are below the diaphragm. No pneumothorax. There is no edema or consolidation. Heart size and pulmonary vascularity are within normal limits. No adenopathy. No bone lesions.  IMPRESSION: Tube and catheter positions as described without pneumothorax. No edema or consolidation.   Electronically Signed   By: Bretta BangWilliam  Woodruff M.D.    On: 01/10/2015 07:17   Dg Chest Port 1 View  01/09/2015   CLINICAL DATA:  PICC/central line placement.  Shortness of breath.  EXAM: PORTABLE CHEST - 1 VIEW  COMPARISON:  01/09/2015 and 07/20/2009.  FINDINGS: Endotracheal tube with tip 7.6 cm above the carina. Enteric tube courses into the region of the stomach and off the film as tip is not visualized. Right IJ central venous catheter has tip over the SVC. External defibrillator over the left chest. Lungs are adequately inflated without consolidation or effusion. Minimal stable prominence of the central vascular markings. Cardiomediastinal silhouette and remainder the exam is unchanged. There is no evidence of right-sided pneumothorax.  IMPRESSION: Suggestion mild vascular congestion.  Tubes and lines as described.   Electronically Signed   By: Elberta Fortisaniel  Boyle M.D.   On: 01/09/2015 15:44   Dg Chest Portable 1 View  01/09/2015   CLINICAL DATA:  CPR  EXAM: PORTABLE CHEST - 1 VIEW  COMPARISON:  07/20/2009  FINDINGS: Normal heart size. Endotracheal tube is 6.2 cm from the carina. NG tube is in the fundus of the stomach. Clear lungs. No pneumothorax. No pleural effusion. Normal vascularity.  IMPRESSION: Endotracheal and NG tubes as described.  Clear lungs.   Electronically Signed   By: Maryclare BeanArt  Hoss M.D.   On: 01/09/2015 14:43     EKG Interpretation   Date/Time:  Monday January 09 2015 13:44:02 EST Ventricular Rate:  127 PR Interval:  158 QRS Duration: 109 QT Interval:  361 QTC Calculation: 525 R Axis:   -23 Text Interpretation:  Sinus tachycardia vs a fib Multiple premature  complexes, vent  Inferior infarct, age indeterminate Probable posterior  infarct, recent Prolonged QT interval Baseline wander in lead(s) V4 V5  Confirmed by Justyce Yeater  MD, Dineen Conradt ([id number]) on 01/09/2015 2:07:43 PM      MDM   Final diagnoses:  Cardiac arrest  Encounter for central line placement   Patient presented after witnessed cardiac arrest proximal May 20 minutes prior to  arrival, patient had return of spontaneous circulation after defibrillation and epinephrine. Patient recently had admission for cardiac  arrest complications from that, recent discharge summary reviewed. No family at bedside at this time. Patient on records is on to [facility] lower. Patient did have a head injury from the syncope. CT head and neck ordered and pending. Patient intubated emergently by the resident I was there for the procedure. Peripheral IVs placed. Cold ice packs and saline started in the ER. Discussed with critical care who agreed with hypothermia protocol, discussed with cardiology for consult.   The patients results and plan were reviewed and discussed.   Any x-rays performed were personally reviewed by myself.   Differential diagnosis were considered with the presenting HPI.  Medications  etomidate (AMIDATE) injection (20 mg Intravenous Given 01/09/15 1340)  rocuronium (ZEMURON) injection (70 mg Intravenous Given 01/09/15 1340)  fentaNYL (SUBLIMAZE) injection 50 mcg (50 mcg Intravenous Given 01/09/15 1545)  fentaNYL (SUBLIMAZE) injection 50 mcg (not administered)  norepinephrine (LEVOPHED) 4 mg in dextrose 5 % 250 mL (0.016 mg/mL) infusion (not administered)  aspirin suppository 300 mg (300 mg Rectal Not Given 01/09/15 1502)  cisatracurium (NIMBEX) bolus via infusion 6.8 mg (6.8 mg Intravenous New Bag/Given 01/09/15 1551)    And  cisatracurium (NIMBEX) 200 mg in sodium chloride 0.9 % 200 mL (1 mg/mL) infusion (1.5 mcg/kg/min  68 kg Intravenous Rate/Dose Verify 01/10/15 0800)    And  cisatracurium (NIMBEX) bolus via infusion 3.4 mg (not administered)  artificial tears (LACRILUBE) ophthalmic ointment 1 application (1 application Both Eyes Given 01/10/15 1516)  heparin injection 5,000 Units (5,000 Units Subcutaneous Given 01/10/15 1516)  nicardipine (CARDENE)  in 0.86% saline IV infusion (0.1 mg/ml) (0 mg/hr Intravenous Stopped 01/10/15 0216)  midazolam (VERSED) 50 mg in sodium  chloride 0.9 % 50 mL (1 mg/mL) infusion (3 mg/hr Intravenous Rate/Dose Verify 01/10/15 0800)  fentaNYL (SUBLIMAZE) 2,500 mcg in sodium chloride 0.9 % 250 mL (10 mcg/mL) infusion (200 mcg/hr Intravenous Rate/Dose Verify 01/10/15 0800)  pantoprazole (PROTONIX) injection 40 mg (40 mg Intravenous Given 01/09/15 1806)  metoprolol (LOPRESSOR) injection 5 mg (5 mg Intravenous Not Given 01/10/15 1330)  0.9 %  sodium chloride infusion ( Intravenous Rate/Dose Verify 01/10/15 0800)  chlorhexidine (PERIDEX) 0.12 % solution 15 mL (15 mLs Mouth Rinse Given 01/10/15 0744)  antiseptic oral rinse (CPC / CETYLPYRIDINIUM CHLORIDE 0.05%) solution 7 mL (7 mLs Mouth Rinse Given 01/10/15 1600)  sodium chloride 0.9 % bolus 1,000 mL (1,000 mLs Intravenous New Bag/Given 01/09/15 1519)  0.9 %  sodium chloride infusion (0 mLs Intravenous Stopped 01/09/15 1520)  aspirin suppository 300 mg (300 mg Rectal Given 01/09/15 1353)  labetalol (NORMODYNE,TRANDATE) injection 10 mg (10 mg Intravenous Given 01/09/15 1415)  0.9 %  sodium chloride infusion (0 mLs Intravenous Stopped 01/09/15 1500)  fentaNYL (SUBLIMAZE) 0.05 MG/ML injection (  Duplicate 01/09/15 1530)  potassium chloride 10 mEq in 50 mL *CENTRAL LINE* IVPB (10 mEq Intravenous Given 01/09/15 2004)  metoprolol (LOPRESSOR) 1 MG/ML injection (  Duplicate 01/09/15 1730)  potassium chloride 20 MEQ/15ML (10%) solution 40 mEq (40 mEq Per Tube Given 01/10/15 0553)  potassium chloride 10 mEq in 50 mL *CENTRAL LINE* IVPB (10 mEq Intravenous Given 01/10/15 1031)    Filed Vitals:   01/10/15 1300 01/10/15 1400 01/10/15 1500 01/10/15 1530  BP:    96/65  Pulse: 87 84 84 84  Temp: 91.6 F (33.1 C) 91.4 F (33 C) 91.2 F (32.9 C)   TempSrc: Core (Comment) Core (Comment) Core (Comment)   Resp: Height:      Weight:  SpO2: 99% 99% 99% 100%    Final diagnoses:  Cardiac arrest  Encounter for central line placement    Admission/ observation were discussed with the admitting  physician   Enid Skeens, MD 01/10/15 1711  Enid Skeens, MD 01/10/15 [phone]

## 2015-01-09 NOTE — Progress Notes (Signed)
eLink Physician-Brief Progress Note Patient Name: Scott EstimableJames [name] DOB: 1946-08-03 MRN: [id number]   Date of Service  01/09/2015  HPI/Events of Note  K low   eICU Interventions  K IV supp      Intervention Category Major Interventions: Arrhythmia - evaluation and management Intermediate Interventions: Electrolyte abnormality - evaluation and management  [name] 01/09/2015, 5:20 PM

## 2015-01-09 NOTE — Consult Note (Signed)
CARDIOLOGY CONSULT NOTE   Patient ID: [name] MRN: [id number], DOB/AGE: [birth year]   Admit date: 01/09/2015 Date of Consult: 01/09/2015   Primary Physician: No PCP Per Patient Primary Cardiologist: Dr. Anne Fu  Pt. 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 Smoker   . Cardiac arrest     10/2014  . MI (myocardial infarction)     10/2014 - PCI to RCA  . Ischemic cardiomyopathy     Past Surgical History  Procedure Laterality Date  . Left heart catheterization with coronary angiogram N/A 10/27/2014    Procedure: LEFT HEART CATHETERIZATION WITH CORONARY ANGIOGRAM;  Surgeon: Kathleene Hazel, MD;  Location: [facility] CATH LAB;  Service: Cardiovascular;  Laterality: N/A;     Allergies  Allergies  Allergen Reactions  . 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 He is well-known to the cardiology service due to his recent admission following cardiac arrest. Back in November 2015, he arrived as a inferior STEMI secondary to occluded distal RCA followed by out of the hospital cardiac arrest due to ventricular fibrillation. He underwent cardiac catheterization on 10/27/2014 which showed EF 25-30% with inferior hypokinesis, he had 99% subtotal occlusion in distal RCA with diffuse 95% mid RCA stenosis, the lesion was treated with PTCA. No stent was placed at the time given his homeless status and questionable compliance. His last echocardiogram on 10/28/2014 showed EF 20-25%, mild MR, grade 1 diastolic dysfunction. His  hospitalization was prolonged due to poor mental status. He was seen by psychiatry on 11/11 who noted the patient is unable to make his own medical decision given his significant cocaine abuse and substance induced mood disorder. He was eventually discharged on 11/14 to follow-up with psychiatry as outpatient.   Apparently patient was walking with a friend on 01/09/2015 when he told his friend that he wasn't feeling good. He subsequently collapsed. He was down for roughly 10 minutes before EMS arrived and initiated CPR. CPR lasted 10 minutes with 4 shocks prior to ROSC. He was subsequently intubated and transferred to [facility] 40 further evaluation. A left EJ catheter was placed. Cardiology has been consulted for cardiac arrest.   No current facility-administered medications on file prior to encounter.   Current Outpatient Prescriptions on File Prior to Encounter  Medication Sig Dispense Refill  . amiodarone (PACERONE) 200 MG tablet Take 1 tablet (200 mg total) by mouth daily. 30 tablet 3  . amiodarone (PACERONE) 400 MG tablet Take 1 tablet (400 mg total) by mouth 2 (two) times daily. 30 tablet 0  . amiodarone (PACERONE) 400 MG tablet Take 1 tablet (400 mg total) by mouth daily. 7 tablet 0  . aspirin 81 MG chewable tablet Chew 1 tablet (81 mg total) by mouth daily. 30 tablet 2  . atorvastatin (LIPITOR) 80 MG tablet Take 1 tablet (80 mg total) by mouth daily at 6 PM. 30 tablet 2  . carvedilol (COREG) 6.25 MG tablet Take 1 tablet (6.25 mg total) by mouth 2 (two) times daily with a meal. 60 tablet 2  . clonazePAM (  KLONOPIN) 0.5 MG tablet Take 1 tablet (0.5 mg total) by mouth 2 (two) times daily. 30 tablet 0  . divalproex (DEPAKOTE ER) 500 MG 24 hr tablet Take 1 tablet (500 mg total) by mouth 2 (two) times daily. 60 tablet 2  . ipratropium-albuterol (DUONEB) 0.5-2.5 (3) MG/3ML SOLN Take 3 mLs by nebulization every 6 (six) hours as needed. 360 mL 1  . lisinopril (PRINIVIL,ZESTRIL) 2.5 MG  tablet Take 1 tablet (2.5 mg total) by mouth daily. 30 tablet 2  . pantoprazole (PROTONIX) 40 MG tablet Take 1 tablet (40 mg total) by mouth daily at 12 noon. 30 tablet 2  . QUEtiapine (SEROQUEL) 25 MG tablet Take 1 tablet (25 mg total) by mouth 2 (two) times daily. 60 tablet 0  . ticagrelor (BRILINTA) 90 MG TABS tablet Take 1 tablet (90 mg total) by mouth 2 (two) times daily. 60 tablet 3  . ziprasidone (GEODON) injection Inject 10 mg into the muscle 4 (four) times daily as needed for agitation (maximum dose 20 mg for 24 hours only.). 100 each 2    Family History No family history on file.   Social History History   Social History  . Marital Status: Single    Spouse Name: N/A    Number of Children: N/A  . Years of Education: N/A   Occupational History  . Not on file.   Social History Main Topics  . Smoking status: Current Every Day Smoker    Types: Cigarettes  . Smokeless tobacco: Not on file  . Alcohol Use: Not on file  . Drug Use: Not on file  . Sexual Activity: Not on file   Other Topics Concern  . Not on file   Social History Narrative   ** Merged History Encounter **         Review of Systems  Review of system was unable to be obtained as the patient is intubated and unable to answer any questions.  Physical Exam  Blood pressure 191/113, pulse 140, temperature 94.6 F (34.8 C), temperature source Temporal, resp. rate 16, height  (1.88 m), weight 150 lb (68.04 kg), SpO2 100 %.  General: intubated and sedated  Neuro:on unable to assess HEEN: intubated, left EJ in place Lungs:  intubated, coarse sound bilaterally  Heart: tachycardic, no obvious murmurs Abdomen: Soft, non-tender, non-distended, BS + x 4.  Extremities: No clubbing, cyanosis or edema. DP/PT/Radials 2+ and equal bilaterally.  Labs  No results for input(s): CKTOTAL, CKMB, TROPONINI in the last 72 hours. Lab Results  Component Value Date   WBC 7.3 11/03/2014   HGB 11.3* 11/03/2014   HCT  34.0* 11/03/2014   MCV 85.0 11/03/2014   PLT 236 11/03/2014   No results for input(s): NA, K, CL, CO2, BUN, CREATININE, CALCIUM, PROT, BILITOT, ALKPHOS, ALT, AST, GLUCOSE in the last 168 hours.  Invalid input(s): LABALBU Lab Results  Component Value Date   CHOL 180 10/29/2014   HDL 55 10/29/2014   LDLCALC 116* 10/29/2014   TRIG 46 10/29/2014   No results found for: DDIMER  Radiology/Studies  No results found.  ECG  tachycardia, no obvious ST elevation.  ASSESSMENT AND PLAN  1. Cardiac arrest 2/2 Vfib  - in this patient who had recent inferior STEMI, cocaine abuse and EF 25% +/- prolonged QTc  - recheck echo to establish EF. Check UDT for cocaine use  - patient is currently hypertensive and tachycardic, IV labetalol given. However if UDT positive for cocaine, will need to avoid further  BB  - likely need therapeutic hypothermic measure  - no obvious ST elevation on EKG to warrant emergent cath, however base on echo result, may still need cath if EF drastically changed or wall motion abnormal. If psychiatric problem and poor mental recovery may prevent advanced invasive intervention. Pending stat bedside echo - further recommendation per MD  2. CAD s/p inferior STEMI and cardiac arrest in Nov 2015 3. history of cocaine use 4. Homelessness 5. 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  Discussed care with CCM and ER doctor.  Unfortunate homeless male with recurrent cardiac arrest.  Shocked in field for VF.  UDS pending history of drug use.  ECG with sinus tachycardia Old IMI no acute changes.  Iv labatolol for HR/BP.  Will need CT head and cooling protocol per CCM.  Exam remarkable for unresponsive black male , intubated , left EJ line and plus 2 LE edema.  Cardiology will follow along.  Check enzymes and repeat echo for EF and r/o effusion   Charlton HawsPeter Nishan

## 2015-01-10 ENCOUNTER — Inpatient Hospital Stay (HOSPITAL_COMMUNITY): Payer: Medicare Other

## 2015-01-10 ENCOUNTER — Encounter (HOSPITAL_COMMUNITY): Payer: Self-pay | Admitting: *Deleted

## 2015-01-10 DIAGNOSIS — I255 Ischemic cardiomyopathy: Secondary | ICD-10-CM

## 2015-01-10 DIAGNOSIS — G931 Anoxic brain damage, not elsewhere classified: Secondary | ICD-10-CM | POA: Insufficient documentation

## 2015-01-10 DIAGNOSIS — E440 Moderate protein-calorie malnutrition: Secondary | ICD-10-CM | POA: Insufficient documentation

## 2015-01-10 DIAGNOSIS — J9601 Acute respiratory failure with hypoxia: Secondary | ICD-10-CM | POA: Insufficient documentation

## 2015-01-10 LAB — BASIC METABOLIC PANEL
ANION GAP: 9 (ref 5–15)
ANION GAP: 10 (ref 5–15)
ANION GAP: 9 (ref 5–15)
Anion gap: 5 (ref 5–15)
Anion gap: 8 (ref 5–15)
Anion gap: 12 (ref 5–15)
BUN: 8 mg/dL (ref 6–23)
BUN: 8 mg/dL (ref 6–23)
BUN: 10 mg/dL (ref 6–23)
BUN: 10 mg/dL (ref 6–23)
BUN: 11 mg/dL (ref 6–23)
BUN: 13 mg/dL (ref 6–23)
CHLORIDE: 110 meq/L (ref 96–112)
CHLORIDE: 110 meq/L (ref 96–112)
CO2: 28 mmol/L (ref 19–32)
CO2: 26 mmol/L (ref 19–32)
CO2: 26 mmol/L (ref 19–32)
CO2: 23 mmol/L (ref 19–32)
CO2: 21 mmol/L (ref 19–32)
CO2: 22 mmol/L (ref 19–32)
CREATININE: 0.62 mg/dL (ref 0.50–1.35)
CREATININE: 0.7 mg/dL (ref 0.50–1.35)
Calcium: 9.3 mg/dL (ref 8.4–10.5)
Calcium: 9.3 mg/dL (ref 8.4–10.5)
Calcium: 9.3 mg/dL (ref 8.4–10.5)
Calcium: 9.2 mg/dL (ref 8.4–10.5)
Calcium: 9.4 mg/dL (ref 8.4–10.5)
Calcium: 9.2 mg/dL (ref 8.4–10.5)
Chloride: 105 mEq/L (ref 96–112)
Chloride: 105 mEq/L (ref 96–112)
Chloride: 109 mEq/L (ref 96–112)
Chloride: 111 mEq/L (ref 96–112)
Creatinine, Ser: 0.62 mg/dL (ref 0.50–1.35)
Creatinine, Ser: 0.58 mg/dL (ref 0.50–1.35)
Creatinine, Ser: 0.61 mg/dL (ref 0.50–1.35)
Creatinine, Ser: 0.62 mg/dL (ref 0.50–1.35)
GFR calc Af Amer: >90 mL/min (ref >90)
GFR calc Af Amer: >90 mL/min (ref >90)
GFR calc Af Amer: >90 mL/min (ref >90)
GFR calc non Af Amer: >90 mL/min (ref >90)
GFR calc non Af Amer: >90 mL/min (ref >90)
GFR calc non Af Amer: >90 mL/min (ref >90)
GFR calc non Af Amer: >90 mL/min (ref >90)
GLUCOSE: 104 mg/dL — AB (ref 70–99)
GLUCOSE: 98 mg/dL (ref 70–99)
GLUCOSE: 99 mg/dL (ref 70–99)
Glucose, Bld: 101 mg/dL — ABNORMAL HIGH (ref 70–99)
Glucose, Bld: 102 mg/dL — ABNORMAL HIGH (ref 70–99)
Glucose, Bld: 106 mg/dL — ABNORMAL HIGH (ref 70–99)
Potassium: 3.4 mmol/L — ABNORMAL LOW (ref 3.5–5.1)
Potassium: 3.2 mmol/L — ABNORMAL LOW (ref 3.5–5.1)
Potassium: 3.9 mmol/L (ref 3.5–5.1)
Potassium: 3.9 mmol/L (ref 3.5–5.1)
Potassium: 3.7 mmol/L (ref 3.5–5.1)
Potassium: 3.5 mmol/L (ref 3.5–5.1)
SODIUM: 141 mmol/L (ref 135–145)
SODIUM: 142 mmol/L (ref 135–145)
Sodium: 142 mmol/L (ref 135–145)
Sodium: 141 mmol/L (ref 135–145)
Sodium: 141 mmol/L (ref 135–145)
Sodium: 142 mmol/L (ref 135–145)

## 2015-01-10 LAB — GLUCOSE, CAPILLARY
GLUCOSE-CAPILLARY: 95 mg/dL (ref 70–99)
GLUCOSE-CAPILLARY: 93 mg/dL (ref 70–99)
GLUCOSE-CAPILLARY: 95 mg/dL (ref 70–99)
Glucose-Capillary: 90 mg/dL (ref 70–99)
Glucose-Capillary: 91 mg/dL (ref 70–99)
Glucose-Capillary: 91 mg/dL (ref 70–99)
Glucose-Capillary: 94 mg/dL (ref 70–99)
Glucose-Capillary: 97 mg/dL (ref 70–99)
Glucose-Capillary: 98 mg/dL (ref 70–99)
Glucose-Capillary: 98 mg/dL (ref 70–99)

## 2015-01-10 LAB — BLOOD GAS, ARTERIAL
Acid-Base Excess: 0.7 mmol/L (ref 0.0–2.0)
BICARBONATE: 24 meq/L (ref 20.0–24.0)
Drawn by: 41977
FIO2: 1 %
Hi Frequency JET Vent Rate: 5
LHR: 16 {breaths}/min
O2 SAT: 99.8 %
PATIENT TEMPERATURE: 98.6
PCO2 ART: 33.2 mmHg — AB (ref 35.0–45.0)
PH ART: 7.473 — AB (ref 7.350–7.450)
TCO2: 25 mmol/L (ref 0–100)
VT: 600 mL
pO2, Arterial: 424 mmHg — ABNORMAL HIGH (ref 80.0–100.0)

## 2015-01-10 LAB — LACTIC ACID, PLASMA: Lactic Acid, Venous: 1.3 mmol/L (ref 0.5–2.2)

## 2015-01-10 LAB — TROPONIN I
TROPONIN I: 0.13 ng/mL — AB (ref <0.031)
Troponin I: 0.19 ng/mL — ABNORMAL HIGH (ref <0.031)

## 2015-01-10 LAB — CBC
HEMATOCRIT: 38.7 % — AB (ref 39.0–52.0)
Hemoglobin: 13 g/dL (ref 13.0–17.0)
MCH: 28.1 pg (ref 26.0–34.0)
MCHC: 33.6 g/dL (ref 30.0–36.0)
MCV: 83.6 fL (ref 78.0–100.0)
Platelets: 303 10*3/uL (ref 150–400)
RBC: 4.63 MIL/uL (ref 4.22–5.81)
RDW: 13.8 % (ref 11.5–15.5)
WBC: 5.7 10*3/uL (ref 4.0–10.5)

## 2015-01-10 LAB — MAGNESIUM: Magnesium: 1.9 mg/dL (ref 1.5–2.5)

## 2015-01-10 LAB — PHOSPHORUS: PHOSPHORUS: 2.6 mg/dL (ref 2.3–4.6)

## 2015-01-10 MED ORDER — POTASSIUM CHLORIDE 10 MEQ/50ML IV SOLN
10.0000 meq | INTRAVENOUS | Status: AC
  Administered 2015-01-10 (×3): 10 meq via INTRAVENOUS
  Filled 2015-01-10 (×3): qty 50

## 2015-01-10 MED ORDER — SODIUM CHLORIDE 0.45 % IV BOLUS
500.0000 mL | Freq: Once | INTRAVENOUS | Status: AC
  Administered 2015-01-10: 500 mL via INTRAVENOUS

## 2015-01-10 MED ORDER — POTASSIUM CHLORIDE 20 MEQ/15ML (10%) PO SOLN
40.0000 meq | Freq: Once | ORAL | Status: AC
  Administered 2015-01-10: 40 meq
  Filled 2015-01-10: qty 30

## 2015-01-10 NOTE — Progress Notes (Signed)
RN says falling ur op   Recent Labs Lab 01/10/15 0242 01/10/15 0645 01/10/15 1000 01/10/15 1442 01/10/15 1835  CREATININE 0.62 0.58 0.61 0.62 0.62   Estimated Creatinine Clearance: 86.2 mL/min (by C-G formula based on Cr of 0.62).    Plan 500 cc fluid bolus and reassess   Dr. Kalman ShanMurali Aniza Shor, M.D., [facility].P Pulmonary and Critical Care Medicine Staff Physician [facility] [place] [facility] and Critical Care Pager: [phone], If no answer or between  15:00h - 7:00h: call [phone]  01/10/2015 10:46 PM

## 2015-01-10 NOTE — Progress Notes (Signed)
1. [place] donor informed that they are following patient  2. Labs - low K per RN   Recent Labs Lab 01/09/15 1411 01/09/15 1440 01/09/15 1641 01/09/15 1848 01/09/15 2058 01/09/15 2304 01/10/15 0242  NA 143  --  145 143 142 138 142  K 3.2*  --  3.4* 3.7 3.9 3.9 3.4*  CL 108  --  106 107 112 116* 105  CO2 19  --   --   --   --   --  28  GLUCOSE 121*  --  192* 188* 139* 106* 104*  BUN 8  --  10 9 8 9 8   CREATININE 1.10  --  0.60 0.50 0.40* 0.60 0.62  CALCIUM 9.0  --   --   --   --   --  9.3  MG  --  2.0  --   --   --   --   --   PHOS  --  4.8*  --   --   --   --   --      A Low K  P Replete Check mag and phos Trend lactate   Dr. Kalman ShanMurali Ranvir Renovato, M.D., [facility].C.P Pulmonary and Critical Care Medicine Staff Physician [facility] [place] [facility] and Critical Care Pager: [phone], If no answer or between  15:00h - 7:00h: call [phone]  01/10/2015 5:05 AM

## 2015-01-10 NOTE — Progress Notes (Signed)
Patient ID: [name], male   DOB: [birth year], 69 y.o.   MRN: [id number]    SUBJECTIVE: Patient undergoing hypothermia protocol.  Overnight, was tachycardic, looks like sinus tachy with occasional atrial runs, no definite atrial fibrillation but cannot rule out.  Currently NSR.   Scheduled Meds: . antiseptic oral rinse  7 mL Mouth Rinse QID  . artificial tears  1 application Both Eyes 3 times per day  . aspirin  300 mg Rectal NOW  . chlorhexidine  15 mL Mouth Rinse BID  . heparin  5,000 Units Subcutaneous 3 times per day  . metoprolol  5 mg Intravenous Q4H  . pantoprazole (PROTONIX) IV  40 mg Intravenous Q24H   Continuous Infusions: . sodium chloride 10 mL/hr at 01/09/15 2100  . cisatracurium (NIMBEX) infusion 1.5 mcg/kg/min (01/09/15 1931)  . fentaNYL infusion INTRAVENOUS 200 mcg/hr (01/10/15 0439)  . midazolam (VERSED) infusion 3 mg/hr (01/10/15 0004)  . niCARDipine Stopped (01/10/15 0216)  . norepinephrine (LEVOPHED) Adult infusion     PRN Meds:.[COMPLETED] cisatracurium **AND** cisatracurium (NIMBEX) infusion **AND** cisatracurium, etomidate, fentaNYL, fentaNYL, rocuronium    Filed Vitals:   01/10/15 0400 01/10/15 0500 01/10/15 0600 01/10/15 0700  BP: 102/79     Pulse: 84 86 86   Temp: 91.4 F (33 C) 91.4 F (33 C) 91.4 F (33 C) 91.4 F (33 C)  TempSrc: Core (Comment) Core (Comment) Core (Comment) Core (Comment)  Resp: 16 16 16    Height:      Weight:   147 lb 15.2 oz (67.11 kg)   SpO2: 99% 99% 100%     Intake/Output Summary (Last 24 hours) at 01/10/15 0718 Last data filed at 01/10/15 0600  Gross per 24 hour  Intake 1131.43 ml  Output   4820 ml  Net -3688.57 ml    LABS: Basic Metabolic Panel:  Recent Labs  40/98/1101/18/16 1411 01/09/15 1440  01/09/15 2304 01/10/15 0242 01/10/15 0427  NA 143  --   < > 138 142  --   K 3.2*  --   < > 3.9 3.4*  --   CL 108  --   < > 116* 105  --   CO2 19  --   --   --  28  --   GLUCOSE 121*  --   < > 106* 104*  --   BUN 8   --   < > 9 8  --   CREATININE 1.10  --   < > 0.60 0.62  --   CALCIUM 9.0  --   --   --  9.3  --   MG  --  2.0  --   --   --  1.9  PHOS  --  4.8*  --   --   --  2.6  < > = values in this interval not displayed. Liver Function Tests: No results for input(s): AST, ALT, ALKPHOS, BILITOT, PROT, ALBUMIN in the last 72 hours. No results for input(s): LIPASE, AMYLASE in the last 72 hours. CBC:  Recent Labs  01/09/15 1411  01/09/15 2304 01/10/15 0427  WBC 6.5  --   --  5.7  NEUTROABS 3.7  --   --   --   HGB 12.5*  < > 14.6 13.0  HCT 39.7  < > 43.0 38.7*  MCV 88.8  --   --  83.6  PLT 223  --   --  303  < > = values in this interval not displayed. Cardiac Enzymes:  Recent Labs  01/09/15 1420 01/10/15 0227  TROPONINI <0.03 0.19*   BNP: Invalid input(s): POCBNP D-Dimer: No results for input(s): DDIMER in the last 72 hours. Hemoglobin A1C: No results for input(s): HGBA1C in the last 72 hours. Fasting Lipid Panel: No results for input(s): CHOL, HDL, LDLCALC, TRIG, CHOLHDL, LDLDIRECT in the last 72 hours. Thyroid Function Tests: No results for input(s): TSH, T4TOTAL, T3FREE, THYROIDAB in the last 72 hours.  Invalid input(s): FREET3 Anemia Panel: No results for input(s): VITAMINB12, FOLATE, FERRITIN, TIBC, IRON, RETICCTPCT in the last 72 hours.  RADIOLOGY: Ct Head Wo Contrast  01/09/2015   CLINICAL DATA:  Initial encounter for cardiac arrest.  EXAM: CT HEAD WITHOUT CONTRAST  CT CERVICAL SPINE WITHOUT CONTRAST  TECHNIQUE: Multidetector CT imaging of the head and cervical spine was performed following the standard protocol without intravenous contrast. Multiplanar CT image reconstructions of the cervical spine were also generated.  COMPARISON:  None.  FINDINGS: CT HEAD FINDINGS  Sinuses/Soft tissues: Minimal mucosal thickening of ethmoid air cells and left sphenoid sinus. Clear mastoid air cells.  Intracranial: No mass lesion, hemorrhage, hydrocephalus, acute infarct, intra-axial, or  extra-axial fluid collection.  CT CERVICAL SPINE FINDINGS  Spinal visualization through the bottom of T2. Prevertebral soft tissues not well evaluated secondary to endotracheal and nasogastric tubes. Mild loss of vertebral body height at C4 through C6. Likely within normal variation/chronic. Secretions in the nasopharynx and oral pharynx are also likely related to endotracheal tube in place. No apical pneumothorax. Biapical centrilobular and paraseptal emphysema.  Advanced cervical spondylosis. This results in areas of the central canal stenosis at C4-5 through C6-7. Bilateral neural foraminal narrowing at C5-6 and C6-7. Straightening of expected lordosis.  Skull base intact. Facets are well-aligned. Coronal reformats demonstrate only degenerative irregularity about the C1-2 articulation.  IMPRESSION: 1.  No acute intracranial abnormality. 2. Advanced cervical spondylosis, without acute finding. 3. Degraded evaluation for prevertebral soft tissue swelling, secondary to endotracheal tube in place. 4. Straightening of expected cervical lordosis could be positional, due to muscular spasm, or ligamentous injury. 5. Sinus disease   Electronically Signed   By: Jeronimo Greaves M.D.   On: 01/09/2015 15:03   Ct Cervical Spine Wo Contrast  01/09/2015   CLINICAL DATA:  Initial encounter for cardiac arrest.  EXAM: CT HEAD WITHOUT CONTRAST  CT CERVICAL SPINE WITHOUT CONTRAST  TECHNIQUE: Multidetector CT imaging of the head and cervical spine was performed following the standard protocol without intravenous contrast. Multiplanar CT image reconstructions of the cervical spine were also generated.  COMPARISON:  None.  FINDINGS: CT HEAD FINDINGS  Sinuses/Soft tissues: Minimal mucosal thickening of ethmoid air cells and left sphenoid sinus. Clear mastoid air cells.  Intracranial: No mass lesion, hemorrhage, hydrocephalus, acute infarct, intra-axial, or extra-axial fluid collection.  CT CERVICAL SPINE FINDINGS  Spinal visualization  through the bottom of T2. Prevertebral soft tissues not well evaluated secondary to endotracheal and nasogastric tubes. Mild loss of vertebral body height at C4 through C6. Likely within normal variation/chronic. Secretions in the nasopharynx and oral pharynx are also likely related to endotracheal tube in place. No apical pneumothorax. Biapical centrilobular and paraseptal emphysema.  Advanced cervical spondylosis. This results in areas of the central canal stenosis at C4-5 through C6-7. Bilateral neural foraminal narrowing at C5-6 and C6-7. Straightening of expected lordosis.  Skull base intact. Facets are well-aligned. Coronal reformats demonstrate only degenerative irregularity about the C1-2 articulation.  IMPRESSION: 1.  No acute intracranial abnormality. 2. Advanced cervical spondylosis, without acute finding. 3.  Degraded evaluation for prevertebral soft tissue swelling, secondary to endotracheal tube in place. 4. Straightening of expected cervical lordosis could be positional, due to muscular spasm, or ligamentous injury. 5. Sinus disease   Electronically Signed   By: Jeronimo Greaves M.D.   On: 01/09/2015 15:03   Dg Chest Port 1 View  01/09/2015   CLINICAL DATA:  PICC/central line placement.  Shortness of breath.  EXAM: PORTABLE CHEST - 1 VIEW  COMPARISON:  01/09/2015 and 07/20/2009.  FINDINGS: Endotracheal tube with tip 7.6 cm above the carina. Enteric tube courses into the region of the stomach and off the film as tip is not visualized. Right IJ central venous catheter has tip over the SVC. External defibrillator over the left chest. Lungs are adequately inflated without consolidation or effusion. Minimal stable prominence of the central vascular markings. Cardiomediastinal silhouette and remainder the exam is unchanged. There is no evidence of right-sided pneumothorax.  IMPRESSION: Suggestion mild vascular congestion.  Tubes and lines as described.   Electronically Signed   By: Elberta Fortis M.D.   On:  01/09/2015 15:44   Dg Chest Portable 1 View  01/09/2015   CLINICAL DATA:  CPR  EXAM: PORTABLE CHEST - 1 VIEW  COMPARISON:  07/20/2009  FINDINGS: Normal heart size. Endotracheal tube is 6.2 cm from the carina. NG tube is in the fundus of the stomach. Clear lungs. No pneumothorax. No pleural effusion. Normal vascularity.  IMPRESSION: Endotracheal and NG tubes as described.  Clear lungs.   Electronically Signed   By: Maryclare Bean M.D.   On: 01/09/2015 14:43    PHYSICAL EXAM General: intubated/sedated Neck: No JVD, no thyromegaly or thyroid nodule.  Lungs: Coarse breath sounds. CV: Nondisplaced PMI.  Heart regular S1/S2, no S3/S4, no murmur.  No peripheral edema.  No carotid bruit.  Normal pedal pulses.  Abdomen: Soft, no hepatosplenomegaly, no distention.  Neurologic: Sedated  Extremities: No clubbing or cyanosis.   TELEMETRY: Reviewed telemetry pt in NSR currently.  Tachycardic overnight with sinus tachy and some atrial runs.  Not clear afib but cannot rule out  ASSESSMENT AND PLAN: 69 yo homeless man with history of CAD s/p inferior MI and PTCA in 11/15 as well as cocaine abuse presented with ventricular fibrillation arrest.  1. Vfib arrest: Patient collapsed on street, was down about 20 minutes prior to defibrillation and ROSC.  Cocaine negative in urine.  ECG unchanged (old inferoposterior MI), troponin minimally elevated.  May be scar-related vfib, think less likely new ACS.  - If he recovers, will need cardiac cath. Will need to consider ICD for secondary prevention but social situation makes this very difficult.  2. CAD: TnI minimally elevated, ECG stable with old inferoposterior MI.  As above, may be scar-related vfib rather than new ACS.  However, will need cardiac cath if he has neurologic recovery.  3. Chronic systolic CHF: Ischemic cardiomyopathy.  Echo with stable EF 25-30%.    Marca Ancona 01/10/2015 7:24 AM

## 2015-01-10 NOTE — Progress Notes (Signed)
Patient's urine output < 30 mL/hour, called and spoke with MD Ramaswamy. New orders received. Ivery QualeJackson, Kire Ferg A, RN

## 2015-01-10 NOTE — Progress Notes (Signed)
PULMONARY / CRITICAL CARE MEDICINE   Name: Scott EstimableJames E Stock MRN: [id number] DOB: 1945-12-29    ADMISSION DATE:  01/09/2015 CONSULTATION DATE:  01/09/2015  REFERRING MD :  EDP  CHIEF COMPLAINT:  Cardiac arrest  INITIAL PRESENTATION: 69 year old male with history of recent VF arrest in 10/2014 2nd to RCA lesion. Presents again to ED 1/18 with VF arrest. ROSC after 8 mins ACLS. Total downtime ~20 mins. PCCM to admit.   STUDIES:   SIGNIFICANT EVENTS: 11/5 - 11/14 > admission for VF arrest, underwent hypothermia and PCI to RCA 1/18 CVL left IF >>> 1/18 A-Line left Radial >>> 1/18 EEG normal   SUBJECTIVE:  Some oozing from the central line access.  VITAL SIGNS: Temp:  [89.8 F (32.1 C)-96.1 F (35.6 C)] 91.4 F (33 C) (01/19 0700) Pulse Rate:  [74-146] 88 (01/19 0725) Resp:  [9-30] 16 (01/19 0725) BP: (100-201)/(79-121) 102/79 mmHg (01/19 0725) SpO2:  [93 %-100 %] 99 % (01/19 0725) Arterial Line BP: (99-257)/(63-169) 114/77 mmHg (01/19 0600) FiO2 (%):  [40 %-100 %] 40 % (01/19 0725) Weight:  [147 lb 15.2 oz (67.11 kg)-150 lb 12.7 oz (68.4 kg)] 147 lb 15.2 oz (67.11 kg) (01/19 0600) HEMODYNAMICS: CVP:  [0 mmHg-4 mmHg] 1 mmHg VENTILATOR SETTINGS: Vent Mode:  [-] PRVC FiO2 (%):  [40 %-100 %] 40 % Set Rate:  [16 bmp] 16 bmp Vt Set:  [600 mL] 600 mL PEEP:  [5 cmH20] 5 cmH20 Plateau Pressure:  [16 cmH20-20 cmH20] 18 cmH20 INTAKE / OUTPUT:  Intake/Output Summary (Last 24 hours) at 01/10/15 0738 Last data filed at 01/10/15 0600  Gross per 24 hour  Intake 1131.43 ml  Output   4820 ml  Net -3688.57 ml    PHYSICAL EXAMINATION: General:  Thin black male on vent Neuro:  Obtunded pupils are small and non-reactive to light HEENT:  [place]/AT, No JVD noted. Oozing at central line access.  Cardiovascular:  Tachy, irreg irreg. No MRG noted Lungs:  Coarse breath sounds R>L Abdomen:  Soft, non-distended Musculoskeletal:  No acute deformity Skin:  Skin is dry, flaky lower extremities.    LABS:  CBC  Recent Labs Lab 01/09/15 1411  01/09/15 2058 01/09/15 2304 01/10/15 0427  WBC 6.5  --   --   --  5.7  HGB 12.5*  < > 15.0 14.6 13.0  HCT 39.7  < > 44.0 43.0 38.7*  PLT 223  --   --   --  303  < > = values in this interval not displayed. Coag's  Recent Labs Lab 01/09/15 1411 01/09/15 2154  APTT 35 43*  INR 1.16 1.08   BMET  Recent Labs Lab 01/09/15 1411  01/09/15 2304 01/10/15 0242 01/10/15 0645  NA 143  < > 138 142 141  K 3.2*  < > 3.9 3.4* 3.2*  CL 108  < > 116* 105 105  CO2 19  --   --  28 26  BUN 8  < > 9 8 8   CREATININE 1.10  < > 0.60 0.62 0.58  GLUCOSE 121*  < > 106* 104* 98  < > = values in this interval not displayed. Electrolytes  Recent Labs Lab 01/09/15 1411 01/09/15 1440 01/10/15 0242 01/10/15 0427 01/10/15 0645  CALCIUM 9.0  --  9.3  --  9.3  MG  --  2.0  --  1.9  --   PHOS  --  4.8*  --  2.6  --    Sepsis Markers  Recent Labs  Lab 01/09/15 1422 01/10/15 0530  LATICACIDVEN 7.78* 1.3   ABG  Recent Labs Lab 01/09/15 1514 01/10/15 0359  PHART 7.390 7.473*  PCO2ART 38.0 33.2*  PO2ART 435.0* 424.0*   Liver Enzymes No results for input(s): AST, ALT, ALKPHOS, BILITOT, ALBUMIN in the last 168 hours. Cardiac Enzymes  Recent Labs Lab 01/09/15 1420 01/10/15 0227  TROPONINI <0.03 0.19*   Glucose  Recent Labs Lab 01/09/15 2300 01/09/15 2355 01/10/15 0050 01/10/15 0242 01/10/15 0431 01/10/15 0637  GLUCAP 96 91 98 98 94 95    Imaging Ct Head Wo Contrast  01/09/2015   CLINICAL DATA:  Initial encounter for cardiac arrest.  EXAM: CT HEAD WITHOUT CONTRAST  CT CERVICAL SPINE WITHOUT CONTRAST  TECHNIQUE: Multidetector CT imaging of the head and cervical spine was performed following the standard protocol without intravenous contrast. Multiplanar CT image reconstructions of the cervical spine were also generated.  COMPARISON:  None.  FINDINGS: CT HEAD FINDINGS  Sinuses/Soft tissues: Minimal mucosal thickening of  ethmoid air cells and left sphenoid sinus. Clear mastoid air cells.  Intracranial: No mass lesion, hemorrhage, hydrocephalus, acute infarct, intra-axial, or extra-axial fluid collection.  CT CERVICAL SPINE FINDINGS  Spinal visualization through the bottom of T2. Prevertebral soft tissues not well evaluated secondary to endotracheal and nasogastric tubes. Mild loss of vertebral body height at C4 through C6. Likely within normal variation/chronic. Secretions in the nasopharynx and oral pharynx are also likely related to endotracheal tube in place. No apical pneumothorax. Biapical centrilobular and paraseptal emphysema.  Advanced cervical spondylosis. This results in areas of the central canal stenosis at C4-5 through C6-7. Bilateral neural foraminal narrowing at C5-6 and C6-7. Straightening of expected lordosis.  Skull base intact. Facets are well-aligned. Coronal reformats demonstrate only degenerative irregularity about the C1-2 articulation.  IMPRESSION: 1.  No acute intracranial abnormality. 2. Advanced cervical spondylosis, without acute finding. 3. Degraded evaluation for prevertebral soft tissue swelling, secondary to endotracheal tube in place. 4. Straightening of expected cervical lordosis could be positional, due to muscular spasm, or ligamentous injury. 5. Sinus disease   Electronically Signed   By: Jeronimo Greaves M.D.   On: 01/09/2015 15:03   Ct Cervical Spine Wo Contrast  01/09/2015   CLINICAL DATA:  Initial encounter for cardiac arrest.  EXAM: CT HEAD WITHOUT CONTRAST  CT CERVICAL SPINE WITHOUT CONTRAST  TECHNIQUE: Multidetector CT imaging of the head and cervical spine was performed following the standard protocol without intravenous contrast. Multiplanar CT image reconstructions of the cervical spine were also generated.  COMPARISON:  None.  FINDINGS: CT HEAD FINDINGS  Sinuses/Soft tissues: Minimal mucosal thickening of ethmoid air cells and left sphenoid sinus. Clear mastoid air cells.  Intracranial:  No mass lesion, hemorrhage, hydrocephalus, acute infarct, intra-axial, or extra-axial fluid collection.  CT CERVICAL SPINE FINDINGS  Spinal visualization through the bottom of T2. Prevertebral soft tissues not well evaluated secondary to endotracheal and nasogastric tubes. Mild loss of vertebral body height at C4 through C6. Likely within normal variation/chronic. Secretions in the nasopharynx and oral pharynx are also likely related to endotracheal tube in place. No apical pneumothorax. Biapical centrilobular and paraseptal emphysema.  Advanced cervical spondylosis. This results in areas of the central canal stenosis at C4-5 through C6-7. Bilateral neural foraminal narrowing at C5-6 and C6-7. Straightening of expected lordosis.  Skull base intact. Facets are well-aligned. Coronal reformats demonstrate only degenerative irregularity about the C1-2 articulation.  IMPRESSION: 1.  No acute intracranial abnormality. 2. Advanced cervical spondylosis, without acute finding. 3. Degraded evaluation  for prevertebral soft tissue swelling, secondary to endotracheal tube in place. 4. Straightening of expected cervical lordosis could be positional, due to muscular spasm, or ligamentous injury. 5. Sinus disease   Electronically Signed   By: Jeronimo Greaves M.D.   On: 01/09/2015 15:03   Dg Chest Port 1 View  01/09/2015   CLINICAL DATA:  PICC/central line placement.  Shortness of breath.  EXAM: PORTABLE CHEST - 1 VIEW  COMPARISON:  01/09/2015 and 07/20/2009.  FINDINGS: Endotracheal tube with tip 7.6 cm above the carina. Enteric tube courses into the region of the stomach and off the film as tip is not visualized. Right IJ central venous catheter has tip over the SVC. External defibrillator over the left chest. Lungs are adequately inflated without consolidation or effusion. Minimal stable prominence of the central vascular markings. Cardiomediastinal silhouette and remainder the exam is unchanged. There is no evidence of  right-sided pneumothorax.  IMPRESSION: Suggestion mild vascular congestion.  Tubes and lines as described.   Electronically Signed   By: Elberta Fortis M.D.   On: 01/09/2015 15:44   Dg Chest Portable 1 View  01/09/2015   CLINICAL DATA:  CPR  EXAM: PORTABLE CHEST - 1 VIEW  COMPARISON:  07/20/2009  FINDINGS: Normal heart size. Endotracheal tube is 6.2 cm from the carina. NG tube is in the fundus of the stomach. Clear lungs. No pneumothorax. No pleural effusion. Normal vascularity.  IMPRESSION: Endotracheal and NG tubes as described.  Clear lungs.   Electronically Signed   By: Maryclare Bean M.D.   On: 01/09/2015 14:43     ASSESSMENT / PLAN:  PULMONARY OETT 1/18>>> A:  Acute hypoxemic respiratory failure  P:   Full vent support. VAP prevention per protocol. pcxr this morning unremarkable  CARDIOVASCULAR CVL 1/18 >>> A-line 1/18 >> A:  VF arrest Ischemic cardiomyopathy - LVEF 25% CAD Hypertensive Emergency >improved.  P:  Off cardene gtt MAP goal > 85 mm/Hg SBP goal < 160 PRN labetalol to achieve SBP goal Therapeutic hypothermia. Will start rewarming at 6pm CVP monitoring Cards following  RENAL A:   Metabolic acidosis, lactic Hypokalemia  P:   Replace potassium  Recheck BMET q4hr  GASTROINTESTINAL A:   No acute issues  P:   NPO IV Protonix for SUP  HEMATOLOGIC A:   Anemia, chronic  P:  Follow CBC Heparin for DVT ppx  INFECTIOUS A:   No acute issues  P:   Monitor clinically  ENDOCRINE A:   No acute issues   P:   CBG monitoring and SSI  NEUROLOGIC A:   Acute encephalopathy Cocaine abuse. UDS positive for cocaine  P:   RASS goal: -5 Versed gtt, fentanyl gtt, Nimbex gtt per hypothermia protocol UDS pending Monitor EEG normal     No family at bedside.    Case discussed with Dr Craige Cotta.    Signed:  Dow Adolph, MD PGY-3 Internal Medicine Teaching Service Pager: [phone] 01/10/2015, 7:46 AM    Reviewed above, examined.  69 yo  male with VF arrest.  He will be rewarmed at 6 pm tonight.  He will remain on sedation, paralysis, vent support until then.  Pupils weakly reactive, lungs scattered rhonchi, abd soft.  Will need further neuro assessment after rewarming, and then decide if further cardiac assessment needed.  CC time by me independent of resident time 35 minutes.  Coralyn Helling, MD [facility] Pulmonary/Critical Care 01/10/2015, 11:00 AM Pager:  [phone] After 3pm call: [phone]

## 2015-01-10 NOTE — Progress Notes (Signed)
RT changed ET tube holder due to blood being on it from central line dressing change. RN aware.

## 2015-01-10 NOTE — Progress Notes (Signed)
INITIAL NUTRITION ASSESSMENT  DOCUMENTATION CODES Per approved criteria  -Non-severe (moderate) malnutrition in the context of chronic illness  Pt meets criteria for moderate MALNUTRITION in the context of chronic illness as evidenced by mild to moderate fat and muscle wasting.  INTERVENTION: - Once pt re-warmed, recommend initiation of Vital AF @ 20 ml/hr via OGT and increase by 10 ml every 4 hours to goal rate of 60 ml/hr.   Tube feeding regimen provides 1728 kcal (100% of needs), 108 grams of protein, and 1166 ml of H2O.   NUTRITION DIAGNOSIS: Inadequate oral intake related to inability to eat as evidenced by NPO.   Goal: Pt to meet >/= 90% of their estimated nutrition needs   Monitor:  Weight trend, Vent status/settings, initiation and toleration of TF, labs  Reason for Assessment: Vent  69 y.o. male  Admitting Dx: <principal problem not specified>  ASSESSMENT: 69 year old male with history of recent VF arrest in 10/2014 2nd to RCA lesion. Presents again to ED 1/18 with VF arrest. ROSC after 8 mins ACLS. Total downtime ~20 mins. PCCM to admit.   - Pt currently on hypothermia protocol. Pt to begin re-warming at 6 pm.   Patient is currently intubated on ventilator support MV: 9.6 L/min Temp (24hrs), Avg:91.8 F (33.2 C), Min:89.8 F (32.1 C), Max:96.1 F (35.6 C)  Propofol: none  Nutrition Focused Physical Exam:  Subcutaneous Fat:  Orbital Region: severe wasting Upper Arm Region: mild wasting Thoracic and Lumbar Region: n/a  Muscle:  Temple Region: severe wasting Clavicle Bone Region: mild wasting Clavicle and Acromion Bone Region: mild wasting Scapular Bone Region: n/a Dorsal Hand: WNL Patellar Region: mild wasting Anterior Thigh Region: moderate wasting Posterior Calf Region: WNL to mild wasting  Edema: none  Height: Ht Readings from Last 1 Encounters:  01/09/15  (1.88 m)    Weight: Wt Readings from Last 1 Encounters:  01/10/15 147 lb 15.2  oz (67.11 kg)    Ideal Body Weight: 82.2 kg  % Ideal Body Weight: 82%  Wt Readings from Last 10 Encounters:  01/10/15 147 lb 15.2 oz (67.11 kg)  11/05/14 149 lb 0.5 oz (67.6 kg)   BMI:  Body mass index is 18.99 kg/(m^2).  Estimated Nutritional Needs: Kcal: 1722 Protein: 95-110 g Fluid: Per MD  Skin: Intact  Diet Order:    EDUCATION NEEDS: -Education not appropriate at this time   Intake/Output Summary (Last 24 hours) at 01/10/15 1026 Last data filed at 01/10/15 1000  Gross per 24 hour  Intake 1417.83 ml  Output   5020 ml  Net -3602.17 ml    Last BM: prior to admission   Labs:   Recent Labs Lab 01/09/15 1411 01/09/15 1440  01/09/15 2304 01/10/15 0242 01/10/15 0427 01/10/15 0645  NA 143  --   < > 138 142  --  141  K 3.2*  --   < > 3.9 3.4*  --  3.2*  CL 108  --   < > 116* 105  --  105  CO2 19  --   --   --  28  --  26  BUN 8  --   < > 9 8  --  8  CREATININE 1.10  --   < > 0.60 0.62  --  0.58  CALCIUM 9.0  --   --   --  9.3  --  9.3  MG  --  2.0  --   --   --  1.9  --  PHOS  --  4.8*  --   --   --  2.6  --   GLUCOSE 121*  --   < > 106* 104*  --  98  < > = values in this interval not displayed.  CBG (last 3)   Recent Labs  01/10/15 0431 01/10/15 0637 01/10/15 0841  GLUCAP 94 95 97    Scheduled Meds: . antiseptic oral rinse  7 mL Mouth Rinse QID  . artificial tears  1 application Both Eyes 3 times per day  . aspirin  300 mg Rectal NOW  . chlorhexidine  15 mL Mouth Rinse BID  . heparin  5,000 Units Subcutaneous 3 times per day  . metoprolol  5 mg Intravenous Q4H  . pantoprazole (PROTONIX) IV  40 mg Intravenous Q24H  . potassium chloride  10 mEq Intravenous Q1 Hr x 3    Continuous Infusions: . sodium chloride 10 mL/hr at 01/10/15 0800  . cisatracurium (NIMBEX) infusion 1.5 mcg/kg/min (01/10/15 0800)  . fentaNYL infusion INTRAVENOUS 200 mcg/hr (01/10/15 0800)  . midazolam (VERSED) infusion 3 mg/hr (01/10/15 0800)  . niCARDipine Stopped  (01/10/15 0216)  . norepinephrine (LEVOPHED) Adult infusion      Past Medical History  Diagnosis Date  . Smoker   . Cardiac arrest     10/2014  . MI (myocardial infarction)     10/2014 - PCI to RCA  . Ischemic cardiomyopathy   . Polysubstance abuse     Past Surgical History  Procedure Laterality Date  . Left heart catheterization with coronary angiogram N/A 10/27/2014    Procedure: LEFT HEART CATHETERIZATION WITH CORONARY ANGIOGRAM;  Surgeon: Kathleene Hazelhristopher D McAlhany, MD;  [facility];  Service: Cardiovascular;  Laterality: N/A;    Emmaline KluverHaley Ayari Liwanag MS, RD, LDN

## 2015-01-10 NOTE — Care Management Note (Addendum)
    Page 1 of 2   01/18/2015     2:57:15 PM CARE MANAGEMENT NOTE 01/18/2015  Patient:  [name],[name]   Account Number:  [id number]  Date Initiated:  01/10/2015  Documentation initiated by:  Junius CreamerWELL,Scott  Subjective/Objective Assessment:   adm w cardiac arrest-vent     Action/Plan:   lives w friend   Anticipated DC Date:  01/18/2015   Anticipated DC Plan:  HOME W HOME HEALTH SERVICES  In-house referral  Clinical Social Worker      DC Planning Services  CM consult  Follow-up appt scheduled  Outpatient Services - Pt will follow up      Choice offered to / List presented to:             Status of service:  Completed, signed off Medicare Important Message given?  YES (If response is "NO", the following Medicare IM given date fields will be blank) Date Medicare IM given:  01/12/2015 Medicare IM given by:  Junius CreamerWELL,Scott Date Additional Medicare IM given:  01/16/2015 Additional Medicare IM given by:  Junius CreamerEBBIE Bryant  Discharge Disposition:  HOME/SELF CARE  Per UR Regulation:  Reviewed for med. necessity/level of care/duration of stay  If discussed at Long Length of Stay Meetings, dates discussed:   01/17/2015    Comments:  01/18/15- 1100- Donn PieriniKristi Hayven Croy RN, BSN [phone] Pt has order for outpt PT- spoke with pt at bedside- who states he does want to do the outpt PT- closest location is [facility] location- referral sent to that location via epic- Outpt PT will contact pt to f/u on referral- also per pt he has not followed up with a PCP in a while- states that [facility] (use to see Dr. Bruna PotterBlount) would like to continue to go to this clinic as it is closest to him- call made to clinic [facility] 01/26/15 at 2:50  1/21 1047a Scott dowell rn,bsn spoke w pt. he gave permission for us to speak w son and give son his mc and ma numbers so son can work on [employer] needs. gave son phone number for seniorline for mobile meals. gave son  packet on adv directives. son wants to help w pt getting meds and being mor compiant w meds. son states has been to snf but didn't do well there but not sure if he will need snf vs [facility] after this hosp . will follow and assist as pt progresses.

## 2015-01-11 ENCOUNTER — Inpatient Hospital Stay (HOSPITAL_COMMUNITY): Payer: Medicare Other

## 2015-01-11 DIAGNOSIS — I5022 Chronic systolic (congestive) heart failure: Secondary | ICD-10-CM

## 2015-01-11 DIAGNOSIS — I4901 Ventricular fibrillation: Principal | ICD-10-CM

## 2015-01-11 DIAGNOSIS — E440 Moderate protein-calorie malnutrition: Secondary | ICD-10-CM

## 2015-01-11 LAB — BASIC METABOLIC PANEL
Anion gap: 12 (ref 5–15)
Anion gap: 13 (ref 5–15)
BUN: 15 mg/dL (ref 6–23)
BUN: 16 mg/dL (ref 6–23)
CALCIUM: 9 mg/dL (ref 8.4–10.5)
CO2: 20 mmol/L (ref 19–32)
CO2: 22 mmol/L (ref 19–32)
CREATININE: 1 mg/dL (ref 0.50–1.35)
Calcium: 9.1 mg/dL (ref 8.4–10.5)
Chloride: 110 mEq/L (ref 96–112)
Chloride: 110 mEq/L (ref 96–112)
Creatinine, Ser: 0.82 mg/dL (ref 0.50–1.35)
GFR calc Af Amer: >90 mL/min (ref >90)
GFR, EST AFRICAN AMERICAN: 89 mL/min — AB (ref >90)
GFR, EST NON AFRICAN AMERICAN: 76 mL/min — AB (ref >90)
GLUCOSE: 105 mg/dL — AB (ref 70–99)
GLUCOSE: 100 mg/dL — AB (ref 70–99)
POTASSIUM: 3.8 mmol/L (ref 3.5–5.1)
Potassium: 4.2 mmol/L (ref 3.5–5.1)
SODIUM: 142 mmol/L (ref 135–145)
Sodium: 145 mmol/L (ref 135–145)

## 2015-01-11 LAB — GLUCOSE, CAPILLARY
GLUCOSE-CAPILLARY: 89 mg/dL (ref 70–99)
GLUCOSE-CAPILLARY: 90 mg/dL (ref 70–99)
Glucose-Capillary: 91 mg/dL (ref 70–99)
Glucose-Capillary: 91 mg/dL (ref 70–99)

## 2015-01-11 LAB — CBC
HEMATOCRIT: 40.2 % (ref 39.0–52.0)
Hemoglobin: 13.4 g/dL (ref 13.0–17.0)
MCH: 27.9 pg (ref 26.0–34.0)
MCHC: 33.3 g/dL (ref 30.0–36.0)
MCV: 83.6 fL (ref 78.0–100.0)
PLATELETS: 365 10*3/uL (ref 150–400)
RBC: 4.81 MIL/uL (ref 4.22–5.81)
RDW: 14.3 % (ref 11.5–15.5)
WBC: 21.4 10*3/uL — ABNORMAL HIGH (ref 4.0–10.5)

## 2015-01-11 LAB — CARBOXYHEMOGLOBIN
CARBOXYHEMOGLOBIN: 0.3 % — AB (ref 0.5–1.5)
Methemoglobin: 1.3 % (ref 0.0–1.5)
O2 Saturation: 84.3 %
Total hemoglobin: 13.3 g/dL — ABNORMAL LOW (ref 13.5–18.0)

## 2015-01-11 LAB — PHOSPHORUS: PHOSPHORUS: 5.5 mg/dL — AB (ref 2.3–4.6)

## 2015-01-11 LAB — MAGNESIUM: Magnesium: 1.7 mg/dL (ref 1.5–2.5)

## 2015-01-11 MED ORDER — CETYLPYRIDINIUM CHLORIDE 0.05 % MT LIQD: 7.0000 mL | Freq: Two times a day (BID) | OROMUCOSAL | Status: DC

## 2015-01-11 MED ORDER — SODIUM CHLORIDE 0.9 % IV SOLN
INTRAVENOUS | Status: DC
  Administered 2015-01-11 (×2): via INTRAVENOUS

## 2015-01-11 MED ORDER — ASPIRIN 81 MG PO CHEW
81.0000 mg | CHEWABLE_TABLET | Freq: Every day | ORAL | Status: DC
  Administered 2015-01-13 – 2015-01-18 (×6): 81 mg via ORAL
  Filled 2015-01-11 (×6): qty 1

## 2015-01-11 MED ORDER — FENTANYL CITRATE 0.05 MG/ML IJ SOLN
25.0000 ug | INTRAMUSCULAR | Status: DC | PRN
  Administered 2015-01-11 – 2015-01-13 (×6): 50 ug via INTRAVENOUS
  Filled 2015-01-11 (×6): qty 2

## 2015-01-11 MED ORDER — AMIODARONE HCL IN DEXTROSE 360-4.14 MG/200ML-% IV SOLN
60.0000 mg/h | INTRAVENOUS | Status: DC
  Administered 2015-01-12: 60 mg/h via INTRAVENOUS
  Administered 2015-01-12: 30 mg/h via INTRAVENOUS
  Administered 2015-01-12 – 2015-01-13 (×2): 60 mg/h via INTRAVENOUS
  Filled 2015-01-11 (×12): qty 200

## 2015-01-11 MED ORDER — DILTIAZEM LOAD VIA INFUSION
10.0000 mg | Freq: Once | INTRAVENOUS | Status: AC
  Administered 2015-01-12: 10 mg via INTRAVENOUS
  Filled 2015-01-11: qty 10

## 2015-01-11 MED ORDER — AMIODARONE HCL 200 MG PO TABS
200.0000 mg | ORAL_TABLET | Freq: Two times a day (BID) | ORAL | Status: DC
  Administered 2015-01-11: 200 mg via ORAL
  Filled 2015-01-11 (×2): qty 1

## 2015-01-11 MED ORDER — MAGNESIUM SULFATE 2 GM/50ML IV SOLN
2.0000 g | Freq: Once | INTRAVENOUS | Status: AC
  Administered 2015-01-11: 2 g via INTRAVENOUS
  Filled 2015-01-11: qty 50

## 2015-01-11 MED ORDER — DEXTROSE 5 % IV SOLN
5.0000 mg/h | INTRAVENOUS | Status: DC
  Administered 2015-01-12: 20 mg/h via INTRAVENOUS
  Administered 2015-01-12: 5 mg/h via INTRAVENOUS
  Filled 2015-01-11: qty 100

## 2015-01-11 MED ORDER — ENALAPRIL MALEATE 2.5 MG PO TABS
2.5000 mg | ORAL_TABLET | Freq: Two times a day (BID) | ORAL | Status: DC
  Administered 2015-01-11 (×2): 2.5 mg via ORAL
  Filled 2015-01-11 (×4): qty 1

## 2015-01-11 MED ORDER — AMIODARONE HCL IN DEXTROSE 360-4.14 MG/200ML-% IV SOLN
60.0000 mg/h | INTRAVENOUS | Status: AC
  Administered 2015-01-11 – 2015-01-12 (×2): 60 mg/h via INTRAVENOUS
  Filled 2015-01-11 (×2): qty 200

## 2015-01-11 MED ORDER — ATORVASTATIN CALCIUM 80 MG PO TABS
80.0000 mg | ORAL_TABLET | Freq: Every day | ORAL | Status: DC
  Administered 2015-01-11 – 2015-01-17 (×6): 80 mg via ORAL
  Filled 2015-01-11 (×8): qty 1

## 2015-01-11 MED ORDER — AMIODARONE LOAD VIA INFUSION
150.0000 mg | Freq: Once | INTRAVENOUS | Status: AC
  Administered 2015-01-11: 150 mg via INTRAVENOUS
  Filled 2015-01-11: qty 83.34

## 2015-01-11 MED ORDER — CARVEDILOL 6.25 MG PO TABS
6.2500 mg | ORAL_TABLET | Freq: Two times a day (BID) | ORAL | Status: DC
  Administered 2015-01-11 – 2015-01-16 (×9): 6.25 mg via ORAL
  Filled 2015-01-11 (×13): qty 1

## 2015-01-11 MED ORDER — ONDANSETRON HCL 4 MG/2ML IJ SOLN
4.0000 mg | Freq: Four times a day (QID) | INTRAMUSCULAR | Status: DC | PRN
  Administered 2015-01-12: 4 mg via INTRAVENOUS
  Filled 2015-01-11: qty 2

## 2015-01-11 NOTE — Progress Notes (Signed)
Pt self extubated. RT and Dr Delton CoombesByrum at bedside. Pt on NRB sats 100%.  Pt showing no signs of distress. Will continue to monitor.

## 2015-01-11 NOTE — Progress Notes (Signed)
eLink Physician-Brief Progress Note Patient Name: Scott EstimableJames [name] DOB: May 22, 1946 MRN: [id number]   Date of Service  01/11/2015  HPI/Events of Note  IRIR tachycardia - looks like AFRVR Also c/o pain  eICU Interventions  12 lead EKG Change PO amiodarone to amiodarone bolus and gtt PRN low dose fentanyl Orders were "cleaned up" to reflect his current status     Intervention Category Major Interventions: Arrhythmia - evaluation and management  Billy FischerDavid Lavi Sheehan 01/11/2015, 10:51 PM

## 2015-01-11 NOTE — Progress Notes (Addendum)
Patient ID: [name], male   DOB: 03/25/1946, 69 y.o.   MRN: [id number]    SUBJECTIVE:    Remains intubated/sedated. Now rewarming. According to primary he opens his eves and tracks. Off pressors. CVP 7-8  WBC 5.7-> 21.4.  Afebrile.   Echo 01/09/15 EF 25-30%  (was 20-25% in 11/15)  Scheduled Meds: . antiseptic oral rinse  7 mL Mouth Rinse QID  . artificial tears  1 application Both Eyes 3 times per day  . chlorhexidine  15 mL Mouth Rinse BID  . heparin  5,000 Units Subcutaneous 3 times per day  . metoprolol  5 mg Intravenous Q4H  . pantoprazole (PROTONIX) IV  40 mg Intravenous Q24H   Continuous Infusions: . sodium chloride 10 mL/hr at 01/11/15 0900  . sodium chloride 125 mL/hr at 01/11/15 0900  . cisatracurium (NIMBEX) infusion Stopped (01/11/15 0330)  . fentaNYL infusion INTRAVENOUS Stopped (01/11/15 0530)  . midazolam (VERSED) infusion Stopped (01/11/15 0530)  . niCARDipine Stopped (01/10/15 0216)  . norepinephrine (LEVOPHED) Adult infusion Stopped (01/11/15 0355)   PRN Meds:.[COMPLETED] cisatracurium **AND** cisatracurium (NIMBEX) infusion **AND** cisatracurium, etomidate, fentaNYL, fentaNYL, rocuronium    Filed Vitals:   01/11/15 0700 01/11/15 0726 01/11/15 0800 01/11/15 0900  BP:  120/86 136/83   Pulse: 115 127 134 119  Temp: 98.6 F (37 C)  98.8 F (37.1 C)   TempSrc: Core (Comment)  Core (Comment)   Resp: Height:      Weight:      SpO2: 96%  92% 94%    Intake/Output Summary (Last 24 hours) at 01/11/15 1004 Last data filed at 01/11/15 0900  Gross per 24 hour  Intake 1377.15 ml  Output    491 ml  Net 886.15 ml    LABS: Basic Metabolic Panel:  Recent Labs  78/29/56 0427  01/11/15 0230 01/11/15 0600  NA  --   < > 142 145  K  --   < > 3.8 4.2  CL  --   < > 110 110  CO2  --   < > 20 22  GLUCOSE  --   < > 105* 100*  BUN  --   < > 15 16  CREATININE  --   < > 0.82 1.00  CALCIUM  --   < > 9.0 9.1  MG 1.9  --   --  1.7  PHOS 2.6  --    --  5.5*  < > = values in this interval not displayed. Liver Function Tests: No results for input(s): AST, ALT, ALKPHOS, BILITOT, PROT, ALBUMIN in the last 72 hours. No results for input(s): LIPASE, AMYLASE in the last 72 hours. CBC:  Recent Labs  01/09/15 1411  01/10/15 0427 01/11/15 0600  WBC 6.5  --  5.7 21.4*  NEUTROABS 3.7  --   --   --   HGB 12.5*  < > 13.0 13.4  HCT 39.7  < > 38.7* 40.2  MCV 88.8  --  83.6 83.6  PLT 223  --  303 365  < > = values in this interval not displayed. Cardiac Enzymes:  Recent Labs  01/09/15 1420 01/10/15 0227 01/10/15 0825  TROPONINI <0.03 0.19* 0.13*   BNP: Invalid input(s): POCBNP D-Dimer: No results for input(s): DDIMER in the last 72 hours. Hemoglobin A1C: No results for input(s): HGBA1C in the last 72 hours. Fasting Lipid Panel: No results for input(s): CHOL, HDL, LDLCALC, TRIG, CHOLHDL, LDLDIRECT in the last 72 hours.  Thyroid Function Tests: No results for input(s): TSH, T4TOTAL, T3FREE, THYROIDAB in the last 72 hours.  Invalid input(s): FREET3 Anemia Panel: No results for input(s): VITAMINB12, FOLATE, FERRITIN, TIBC, IRON, RETICCTPCT in the last 72 hours.  RADIOLOGY: Ct Head Wo Contrast  01/09/2015   CLINICAL DATA:  Initial encounter for cardiac arrest.  EXAM: CT HEAD WITHOUT CONTRAST  CT CERVICAL SPINE WITHOUT CONTRAST  TECHNIQUE: Multidetector CT imaging of the head and cervical spine was performed following the standard protocol without intravenous contrast. Multiplanar CT image reconstructions of the cervical spine were also generated.  COMPARISON:  None.  FINDINGS: CT HEAD FINDINGS  Sinuses/Soft tissues: Minimal mucosal thickening of ethmoid air cells and left sphenoid sinus. Clear mastoid air cells.  Intracranial: No mass lesion, hemorrhage, hydrocephalus, acute infarct, intra-axial, or extra-axial fluid collection.  CT CERVICAL SPINE FINDINGS  Spinal visualization through the bottom of T2. Prevertebral soft tissues not well  evaluated secondary to endotracheal and nasogastric tubes. Mild loss of vertebral body height at C4 through C6. Likely within normal variation/chronic. Secretions in the nasopharynx and oral pharynx are also likely related to endotracheal tube in place. No apical pneumothorax. Biapical centrilobular and paraseptal emphysema.  Advanced cervical spondylosis. This results in areas of the central canal stenosis at C4-5 through C6-7. Bilateral neural foraminal narrowing at C5-6 and C6-7. Straightening of expected lordosis.  Skull base intact. Facets are well-aligned. Coronal reformats demonstrate only degenerative irregularity about the C1-2 articulation.  IMPRESSION: 1.  No acute intracranial abnormality. 2. Advanced cervical spondylosis, without acute finding. 3. Degraded evaluation for prevertebral soft tissue swelling, secondary to endotracheal tube in place. 4. Straightening of expected cervical lordosis could be positional, due to muscular spasm, or ligamentous injury. 5. Sinus disease   Electronically Signed   By: Jeronimo Greaves M.D.   On: 01/09/2015 15:03   Ct Cervical Spine Wo Contrast  01/09/2015   CLINICAL DATA:  Initial encounter for cardiac arrest.  EXAM: CT HEAD WITHOUT CONTRAST  CT CERVICAL SPINE WITHOUT CONTRAST  TECHNIQUE: Multidetector CT imaging of the head and cervical spine was performed following the standard protocol without intravenous contrast. Multiplanar CT image reconstructions of the cervical spine were also generated.  COMPARISON:  None.  FINDINGS: CT HEAD FINDINGS  Sinuses/Soft tissues: Minimal mucosal thickening of ethmoid air cells and left sphenoid sinus. Clear mastoid air cells.  Intracranial: No mass lesion, hemorrhage, hydrocephalus, acute infarct, intra-axial, or extra-axial fluid collection.  CT CERVICAL SPINE FINDINGS  Spinal visualization through the bottom of T2. Prevertebral soft tissues not well evaluated secondary to endotracheal and nasogastric tubes. Mild loss of vertebral  body height at C4 through C6. Likely within normal variation/chronic. Secretions in the nasopharynx and oral pharynx are also likely related to endotracheal tube in place. No apical pneumothorax. Biapical centrilobular and paraseptal emphysema.  Advanced cervical spondylosis. This results in areas of the central canal stenosis at C4-5 through C6-7. Bilateral neural foraminal narrowing at C5-6 and C6-7. Straightening of expected lordosis.  Skull base intact. Facets are well-aligned. Coronal reformats demonstrate only degenerative irregularity about the C1-2 articulation.  IMPRESSION: 1.  No acute intracranial abnormality. 2. Advanced cervical spondylosis, without acute finding. 3. Degraded evaluation for prevertebral soft tissue swelling, secondary to endotracheal tube in place. 4. Straightening of expected cervical lordosis could be positional, due to muscular spasm, or ligamentous injury. 5. Sinus disease   Electronically Signed   By: Jeronimo Greaves M.D.   On: 01/09/2015 15:03   Dg Chest Port 1 View  01/11/2015  CLINICAL DATA:  Check endotracheal tube placement  EXAM: PORTABLE CHEST - 1 VIEW  COMPARISON:  01/10/2015  FINDINGS: The endotracheal tube is noted 6 cm above the carina and stable. A right jugular line is seen just above the cavoatrial junction. A nasogastric catheter courses towards the stomach. The cardiac shadow is stable. The lungs are well aerated bilaterally. No acute bony abnormality is seen.  IMPRESSION: Tubes and lines stable in appearance.  No acute abnormality is seen.   Electronically Signed   By: Alcide Clever M.D.   On: 01/11/2015 07:24   Dg Chest Port 1 View  01/10/2015   CLINICAL DATA:  Hypoxia  EXAM: PORTABLE CHEST - 1 VIEW  COMPARISON:  January 09, 2015  FINDINGS: Endotracheal tube tip is 5.1 cm above the carina. Central catheter tip is in the superior vena cava. Nasogastric tube tip and side port are below the diaphragm. No pneumothorax. There is no edema or consolidation. Heart size  and pulmonary vascularity are within normal limits. No adenopathy. No bone lesions.  IMPRESSION: Tube and catheter positions as described without pneumothorax. No edema or consolidation.   Electronically Signed   By: Bretta Bang M.D.   On: 01/10/2015 07:17   Dg Chest Port 1 View  01/09/2015   CLINICAL DATA:  PICC/central line placement.  Shortness of breath.  EXAM: PORTABLE CHEST - 1 VIEW  COMPARISON:  01/09/2015 and 07/20/2009.  FINDINGS: Endotracheal tube with tip 7.6 cm above the carina. Enteric tube courses into the region of the stomach and off the film as tip is not visualized. Right IJ central venous catheter has tip over the SVC. External defibrillator over the left chest. Lungs are adequately inflated without consolidation or effusion. Minimal stable prominence of the central vascular markings. Cardiomediastinal silhouette and remainder the exam is unchanged. There is no evidence of right-sided pneumothorax.  IMPRESSION: Suggestion mild vascular congestion.  Tubes and lines as described.   Electronically Signed   By: Elberta Fortis M.D.   On: 01/09/2015 15:44   Dg Chest Portable 1 View  01/09/2015   CLINICAL DATA:  CPR  EXAM: PORTABLE CHEST - 1 VIEW  COMPARISON:  07/20/2009  FINDINGS: Normal heart size. Endotracheal tube is 6.2 cm from the carina. NG tube is in the fundus of the stomach. Clear lungs. No pneumothorax. No pleural effusion. Normal vascularity.  IMPRESSION: Endotracheal and NG tubes as described.  Clear lungs.   Electronically Signed   By: Maryclare Bean M.D.   On: 01/09/2015 14:43    PHYSICAL EXAM General: intubated. Awake follows commands. cachetic Neck: No JVD, no thyromegaly or thyroid nodule.  Lungs: Coarse b CV: Nondisplaced breath sounds.PMI.  Tachy regular  No peripheral edema.  No carotid bruit.  Normal pedal pulses.  Abdomen: Soft, no hepatosplenomegaly, no distention.  Neurologic: awake on vent wiggles toes and opens eyes on command Extremities: No clubbing or  cyanosis.   TELEMETRY: ST 120  ASSESSMENT AND PLAN: 69 yo homeless man with history of CAD s/p inferior MI and PTCA in 11/15 as well as cocaine abuse presented with ventricular fibrillation arrest.  1. Vfib arrest: Patient collapsed on street, was down about 20 minutes prior to defibrillation and ROSC.  Cocaine negative in urine.  ECG unchanged (old inferoposterior MI), troponin minimally elevated.  Agree with Dr. Shirlee Latch. Likely scar-related vfib, think less likely new ACS.  - Will need cath once extubated. Will need to consider ICD for secondary prevention but social situation makes this very difficult. No recurrent  VT/VF on monitor. Resume po amio 200 bid. Keep K >4.0 Mg > 2.0 2. CAD: TnI minimally elevated, ECG stable with old inferoposterior MI.  As above, may be scar-related vfib rather than new ACS.  Will need cath after extubation. Start ASA and statin 3. Chronic systolic CHF: Ischemic cardiomyopathy.  Echo with stable EF 25-30%.  CVP 8. Will start coreg 6.25 bid, enalapril 2.5 bid. Check co-ox. No diuretic for now.  4. Hypomagnesemia - will supp 5. Leukocytosis - per primary team  6. Acute respiratory failure - vent management per CCM   The patient is critically ill with multiple organ systems failure and requires high complexity decision making for assessment and support, frequent evaluation and titration of therapies, application of advanced monitoring technologies and extensive interpretation of multiple databases.   Critical Care Time devoted to patient care services described in this note is 35 Minutes.  Arvilla Meresaniel Bonni Neuser MD 01/11/2015 10:04 AM

## 2015-01-11 NOTE — Progress Notes (Signed)
PULMONARY / CRITICAL CARE MEDICINE   Name: Scott EstimableJames E Pitta MRN: [id number] DOB: 07/18/1946    ADMISSION DATE:  01/09/2015 CONSULTATION DATE:  01/09/2015  REFERRING MD :  EDP  CHIEF COMPLAINT:  Cardiac arrest  INITIAL PRESENTATION: 69 year old male with history of recent VF arrest in 10/2014 2nd to RCA lesion. Presents again to ED 1/18 with VF arrest. ROSC after 8 mins ACLS. Total downtime ~20 mins. PCCM to admit.   STUDIES:   SIGNIFICANT EVENTS: 11/5 - 11/14 > admission for VF arrest, underwent hypothermia and PCI to RCA 1/18 CVL left IF >>> 1/18 A-Line left Radial >>> 1/18 EEG normal 1/19 Repeat ECHO 25-30% with inferior wall hypokinesis. Unchanged from 10/2014   SUBJECTIVE:  Started normothermia phase last evening at 6pm.  Responds to name.  Received 500 cc IVF bolus due to low urine output  VITAL SIGNS: Temp:  [91 F (32.8 C)-99.5 F (37.5 C)] 98.6 F (37 C) (01/20 0700) Pulse Rate:  [78-115] 115 (01/20 0700) Resp:  [15-18] 16 (01/20 0700) BP: (93-125)/(65-94) 120/86 mmHg (01/20 0400) SpO2:  [95 %-100 %] 96 % (01/20 0700) Arterial Line BP: (87-147)/(53-82) 107/60 mmHg (01/20 0700) FiO2 (%):  [40 %] 40 % (01/20 0422) Weight:  [153 lb 14.5 oz (69.81 kg)] 153 lb 14.5 oz (69.81 kg) (01/20 0400) HEMODYNAMICS: CVP:  [1 mmHg-2 mmHg] 2 mmHg VENTILATOR SETTINGS: Vent Mode:  [-] PRVC FiO2 (%):  [40 %] 40 % Set Rate:  [16 bmp] 16 bmp Vt Set:  [600 mL] 600 mL PEEP:  [5 cmH20] 5 cmH20 Plateau Pressure:  [16 cmH20-23 cmH20] 23 cmH20 INTAKE / OUTPUT:  Intake/Output Summary (Last 24 hours) at 01/11/15 0744 Last data filed at 01/11/15 0700  Gross per 24 hour  Intake 1558.62 ml  Output    706 ml  Net 852.62 ml    PHYSICAL EXAMINATION: General:  Thin black male on vent Neuro:  Responds to name by eye opening. Does not follow commands. Pupils reactive to light HEENT:  [place]/AT, No JVD noted. Cardiovascular:  Tachy, irreg irreg. No MRG noted Lungs:  Coarse breath sounds  R>L Abdomen:  Soft, non-distended Musculoskeletal:  Moves extremities. No acute deformity Skin:  Skin is dry, flaky lower extremities.   LABS:  CBC  Recent Labs Lab 01/09/15 1411  01/09/15 2304 01/10/15 0427 01/11/15 0600  WBC 6.5  --   --  5.7 21.4*  HGB 12.5*  < > 14.6 13.0 13.4  HCT 39.7  < > 43.0 38.7* 40.2  PLT 223  --   --  303 365  < > = values in this interval not displayed. Coag's  Recent Labs Lab 01/09/15 1411 01/09/15 2154  APTT 35 43*  INR 1.16 1.08   BMET  Recent Labs Lab 01/10/15 2230 01/11/15 0230 01/11/15 0600  NA 142 142 145  K 3.5 3.8 4.2  CL 111 110 110  CO2 22 20 22   BUN 13 15 16   CREATININE 0.70 0.82 1.00  GLUCOSE 106* 105* 100*   Electrolytes  Recent Labs Lab 01/09/15 1440  01/10/15 0427  01/10/15 2230 01/11/15 0230 01/11/15 0600  CALCIUM  --   < >  --   < > 9.2 9.0 9.1  MG 2.0  --  1.9  --   --   --  1.7  PHOS 4.8*  --  2.6  --   --   --  5.5*  < > = values in this interval not displayed. Sepsis Markers  Recent  Labs Lab 01/09/15 1422 01/10/15 0530  LATICACIDVEN 7.78* 1.3   ABG  Recent Labs Lab 01/09/15 1514 01/10/15 0359  PHART 7.390 7.473*  PCO2ART 38.0 33.2*  PO2ART 435.0* 424.0*   Liver Enzymes No results for input(s): AST, ALT, ALKPHOS, BILITOT, ALBUMIN in the last 168 hours. Cardiac Enzymes  Recent Labs Lab 01/09/15 1420 01/10/15 0227 01/10/15 0825  TROPONINI <0.03 0.19* 0.13*   Glucose  Recent Labs Lab 01/10/15 1037 01/10/15 1523 01/10/15 1709 01/10/15 2039 01/11/15 0039 01/11/15 0430  GLUCAP 93 90 95 91 91 91    Imaging Dg Chest Port 1 View  01/10/2015   CLINICAL DATA:  Hypoxia  EXAM: PORTABLE CHEST - 1 VIEW  COMPARISON:  January 09, 2015  FINDINGS: Endotracheal tube tip is 5.1 cm above the carina. Central catheter tip is in the superior vena cava. Nasogastric tube tip and side port are below the diaphragm. No pneumothorax. There is no edema or consolidation. Heart size and pulmonary  vascularity are within normal limits. No adenopathy. No bone lesions.  IMPRESSION: Tube and catheter positions as described without pneumothorax. No edema or consolidation.   Electronically Signed   By: Bretta Bang M.D.   On: 01/10/2015 07:17     ASSESSMENT / PLAN:  PULMONARY OETT 1/18>>> A:  Acute hypoxemic respiratory failure  P:   Full vent support. VAP prevention per protocol. pcxr this morning normal  WUA and assess for extubation  CARDIOVASCULAR CVL 1/18 >>> A-line 1/18 >> A:  VF arrest Ischemic cardiomyopathy - LVEF 25% CAD Hypertensive Emergency >improved. Repeat ECHO 25-30% with inferior wall hypokinesis. Unchanged from 10/2014 Low CVP 2 P:  Off cardene gtt MAP goal > 85 mm/Hg SBP goal < 160 PRN labetalol to achieve SBP goal Therapeutic hypothermia started rewarming at 6pm last evening CVP monitoring Cards following ? Whether he would be a candidate for AICD - there would be social barriers to this  RENAL A:   Metabolic acidosis, lactic Hypokalemia > resolved  Low urine output P:   Received a 500 NS bolus last night  Start NS 125 cc/hr for 12 hrs  Monitor urine out put closely  Recheck BMET q4hr  GASTROINTESTINAL A:   No acute issues  P:   NPO IV Protonix for SUP SLP for swallow eval when extubated  HEMATOLOGIC A:   Anemia, chronic Leukocytosis. No fevers  P:  Follow CBC Heparin for DVT ppx  INFECTIOUS A:   No acute issues Leukocytosis No fevers.  P:   No abx indicated at this time.  Will blood cx if he spikes fevers. Monitor clinically  ENDOCRINE A:   No acute issues   P:   CBG monitoring and SSI  NEUROLOGIC A:   Acute encephalopathy > improving Cocaine abuse. UDS positive for cocaine  P:   Change RASS goa to -0 Reduce sedation Monitor EEG normal    Family: Updated son Linward Foster.  Case discussed with Dr Delton Coombes.   Signed:  Dow Adolph, MD PGY-3 Internal Medicine Teaching Service Pager:  [phone] 01/11/2015, 7:44 AM    Attending Note:  I have examined patient, reviewed labs, studies and notes. I have discussed the case with Dr Zada Girt, and I agree with the data and plans as amended above. Pt is s/p arrest and resuscitation. Some reassuring signs 1/20 > wakes and follows some commands. Will continue supportive care and assess for extubation depending on MS. Independent critical care time is 45 minutes.   Levy Pupa, MD, PhD 01/11/2015, 11:15 AM  [facility] [phone] or if no answer [phone]

## 2015-01-11 NOTE — Progress Notes (Signed)
01/11/2015-Respiratory care note- RT called to room at 1105 for self-extubation.  On arrival to room, pt on NRB with sats of 100%, rr10, hr 117.  Pt able to follow commands.  Will monitor progress and wean oxygen as tolerated.  Ventilator and ambu bag on stand-by at bedside.

## 2015-01-11 NOTE — Progress Notes (Signed)
85mL of Versed and 325 mL of Fentanyl wasted with Stanton Kidneyeal Waddell Iten, RN.  Rise PaganiniURRY, Delisha Peaden R, RN

## 2015-01-11 NOTE — Plan of Care (Signed)
Problem: Phase I Progression Outcomes Goal: Aspirin unless contraindicated Outcome: Not Met (add Reason) Patient allergic to aspirin

## 2015-01-12 ENCOUNTER — Inpatient Hospital Stay (HOSPITAL_COMMUNITY): Payer: Medicare Other

## 2015-01-12 DIAGNOSIS — I4891 Unspecified atrial fibrillation: Secondary | ICD-10-CM

## 2015-01-12 LAB — HEPATIC FUNCTION PANEL
ALBUMIN: 2.5 g/dL — AB (ref 3.5–5.2)
ALK PHOS: 98 U/L (ref 39–117)
ALT: 29 U/L (ref 0–53)
AST: 43 U/L — AB (ref 0–37)
Bilirubin, Direct: <0.1 mg/dL (ref 0.0–0.5)
Total Bilirubin: 0.6 mg/dL (ref 0.3–1.2)
Total Protein: 7 g/dL (ref 6.0–8.3)

## 2015-01-12 LAB — URINE MICROSCOPIC-ADD ON

## 2015-01-12 LAB — BASIC METABOLIC PANEL
ANION GAP: 10 (ref 5–15)
BUN: 17 mg/dL (ref 6–23)
CALCIUM: 8.9 mg/dL (ref 8.4–10.5)
CO2: 24 mmol/L (ref 19–32)
CREATININE: 1.07 mg/dL (ref 0.50–1.35)
Chloride: 111 mEq/L (ref 96–112)
GFR calc non Af Amer: 70 mL/min — ABNORMAL LOW (ref >90)
GFR, EST AFRICAN AMERICAN: 82 mL/min — AB (ref >90)
Glucose, Bld: 77 mg/dL (ref 70–99)
POTASSIUM: 4.3 mmol/L (ref 3.5–5.1)
Sodium: 145 mmol/L (ref 135–145)

## 2015-01-12 LAB — CBC
HEMATOCRIT: 38.8 % — AB (ref 39.0–52.0)
HEMATOCRIT: 38.4 % — AB (ref 39.0–52.0)
Hemoglobin: 12.6 g/dL — ABNORMAL LOW (ref 13.0–17.0)
Hemoglobin: 12.4 g/dL — ABNORMAL LOW (ref 13.0–17.0)
MCH: 27.9 pg (ref 26.0–34.0)
MCH: 27.5 pg (ref 26.0–34.0)
MCHC: 32.5 g/dL (ref 30.0–36.0)
MCHC: 32.3 g/dL (ref 30.0–36.0)
MCV: 86 fL (ref 78.0–100.0)
MCV: 85.1 fL (ref 78.0–100.0)
PLATELETS: 374 10*3/uL (ref 150–400)
Platelets: 353 10*3/uL (ref 150–400)
RBC: 4.51 MIL/uL (ref 4.22–5.81)
RBC: 4.51 MIL/uL (ref 4.22–5.81)
RDW: 14.7 % (ref 11.5–15.5)
RDW: 14.6 % (ref 11.5–15.5)
WBC: 24 10*3/uL — ABNORMAL HIGH (ref 4.0–10.5)
WBC: 24.3 10*3/uL — ABNORMAL HIGH (ref 4.0–10.5)

## 2015-01-12 LAB — URINALYSIS, ROUTINE W REFLEX MICROSCOPIC
Bilirubin Urine: NEGATIVE
GLUCOSE, UA: NEGATIVE mg/dL
Ketones, ur: 15 mg/dL — AB
Nitrite: POSITIVE — AB
Protein, ur: NEGATIVE mg/dL
SPECIFIC GRAVITY, URINE: 1.02 (ref 1.005–1.030)
UROBILINOGEN UA: 0.2 mg/dL (ref 0.0–1.0)
pH: 5 (ref 5.0–8.0)

## 2015-01-12 LAB — LACTIC ACID, PLASMA: Lactic Acid, Venous: 2.7 mmol/L (ref 0.5–2.0)

## 2015-01-12 LAB — PROCALCITONIN: Procalcitonin: 14.16 ng/mL

## 2015-01-12 LAB — PHOSPHORUS: Phosphorus: 3.6 mg/dL (ref 2.3–4.6)

## 2015-01-12 LAB — MAGNESIUM: Magnesium: 2.2 mg/dL (ref 1.5–2.5)

## 2015-01-12 LAB — CARBOXYHEMOGLOBIN
CARBOXYHEMOGLOBIN: 0.4 % — AB (ref 0.5–1.5)
Methemoglobin: 1.3 % (ref 0.0–1.5)
O2 Saturation: 52.9 %
TOTAL HEMOGLOBIN: 13 g/dL — AB (ref 13.5–18.0)

## 2015-01-12 LAB — HEPARIN LEVEL (UNFRACTIONATED): Heparin Unfractionated: <0.1 IU/mL — ABNORMAL LOW (ref 0.30–0.70)

## 2015-01-12 MED ORDER — HEPARIN BOLUS VIA INFUSION
3000.0000 [IU] | Freq: Once | INTRAVENOUS | Status: AC
  Administered 2015-01-12: 3000 [IU] via INTRAVENOUS
  Filled 2015-01-12: qty 3000

## 2015-01-12 MED ORDER — HEPARIN (PORCINE) IN NACL 100-0.45 UNIT/ML-% IJ SOLN
1000.0000 [IU]/h | INTRAMUSCULAR | Status: DC
  Administered 2015-01-12: 1000 [IU]/h via INTRAVENOUS
  Filled 2015-01-12 (×2): qty 250

## 2015-01-12 MED ORDER — METOPROLOL TARTRATE 1 MG/ML IV SOLN
2.5000 mg | INTRAVENOUS | Status: DC | PRN
  Administered 2015-01-12: 5 mg via INTRAVENOUS

## 2015-01-12 MED ORDER — ACETAMINOPHEN 325 MG PO TABS
650.0000 mg | ORAL_TABLET | Freq: Four times a day (QID) | ORAL | Status: DC | PRN
  Administered 2015-01-12: 650 mg via ORAL
  Filled 2015-01-12: qty 2

## 2015-01-12 MED ORDER — DIGOXIN 0.25 MG/ML IJ SOLN
0.2500 mg | Freq: Once | INTRAMUSCULAR | Status: AC
  Administered 2015-01-12: 0.25 mg via INTRAVENOUS
  Filled 2015-01-12: qty 1

## 2015-01-12 MED ORDER — SODIUM CHLORIDE 0.9 % IV BOLUS (SEPSIS)
500.0000 mL | Freq: Once | INTRAVENOUS | Status: AC
  Administered 2015-01-12: 500 mL via INTRAVENOUS

## 2015-01-12 MED ORDER — HEPARIN BOLUS VIA INFUSION
2500.0000 [IU] | Freq: Once | INTRAVENOUS | Status: AC
  Administered 2015-01-12: 2500 [IU] via INTRAVENOUS
  Filled 2015-01-12: qty 2500

## 2015-01-12 MED ORDER — DIGOXIN 125 MCG PO TABS
0.1250 mg | ORAL_TABLET | Freq: Every day | ORAL | Status: DC
  Administered 2015-01-13: 0.125 mg via ORAL
  Filled 2015-01-12: qty 1

## 2015-01-12 MED ORDER — METOPROLOL TARTRATE 1 MG/ML IV SOLN
INTRAVENOUS | Status: AC
  Filled 2015-01-12: qty 5

## 2015-01-12 MED ORDER — HEPARIN (PORCINE) IN NACL 100-0.45 UNIT/ML-% IJ SOLN
1500.0000 [IU]/h | INTRAMUSCULAR | Status: DC
  Administered 2015-01-12 – 2015-01-13 (×2): 1200 [IU]/h via INTRAVENOUS
  Filled 2015-01-12 (×3): qty 250

## 2015-01-12 NOTE — Progress Notes (Signed)
ANTICOAGULATION CONSULT NOTE - Initial Consult  Pharmacy Consult for heparin Indication: atrial fibrillation  Allergies  Allergen Reactions  . Aspirin Nausea And Vomiting    Patient Measurements: Height:  (188 cm) Weight: 153 lb 14.5 oz (69.81 kg) IBW/kg (Calculated) : 82.2  Vital Signs: Temp: 98.7 F (37.1 C) (01/21 1700) Temp Source: Oral (01/21 1700) BP: 101/74 mmHg (01/21 2100) Pulse Rate: 128 (01/21 2100)  Labs:  Recent Labs  01/09/15 2154  01/10/15 0227  01/10/15 0825  01/11/15 0230 01/11/15 0600 01/12/15 0440 01/12/15 0835 01/12/15 2040  HGB  --   < >  --   < >  --   --   --  13.4 12.6* 12.4*  --   HCT  --   < >  --   < >  --   --   --  40.2 38.8* 38.4*  --   PLT  --   --   --   < >  --   --   --  365 374 353  --   APTT 43*  --   --   --   --   --   --   --   --   --   --   LABPROT 14.2  --   --   --   --   --   --   --   --   --   --   INR 1.08  --   --   --   --   --   --   --   --   --   --   HEPARINUNFRC  --   --   --   --   --   --   --   --   --   --  <0.10*  CREATININE  --   < >  --   < >  --   < > 0.82 1.00 1.07  --   --   TROPONINI  --   --  0.19*  --  0.13*  --   --   --   --   --   --   < > = values in this interval not displayed.  Estimated Creatinine Clearance: 67 mL/min (by C-G formula based on Cr of 1.07).   Medical History: Past Medical History  Diagnosis Date  . Smoker   . Cardiac arrest     10/2014  . MI (myocardial infarction)     10/2014 - PCI to RCA  . Ischemic cardiomyopathy   . Polysubstance abuse     Medications:  Prescriptions prior to admission  Medication Sig Dispense Refill Last Dose  . ibuprofen (ADVIL,MOTRIN) 200 MG tablet Take 400 mg by mouth every 6 (six) hours as needed (pain).   Past Week at Unknown time  . amiodarone (PACERONE) 200 MG tablet Take 1 tablet (200 mg total) by mouth daily. 30 tablet 3   . amiodarone (PACERONE) 400 MG tablet Take 1 tablet (400 mg total) by mouth 2 (two) times daily. 30 tablet 0    . amiodarone (PACERONE) 400 MG tablet Take 1 tablet (400 mg total) by mouth daily. 7 tablet 0   . aspirin 81 MG chewable tablet Chew 1 tablet (81 mg total) by mouth daily. (Patient not taking: Reported on 01/12/2015) 30 tablet 2 Not Taking at Unknown time  . atorvastatin (LIPITOR) 80 MG tablet Take 1 tablet (80 mg total) by mouth daily at 6  PM. (Patient not taking: Reported on 01/12/2015) 30 tablet 2 Not Taking at Unknown time  . carvedilol (COREG) 6.25 MG tablet Take 1 tablet (6.25 mg total) by mouth 2 (two) times daily with a meal. (Patient not taking: Reported on 01/12/2015) 60 tablet 2 Not Taking at Unknown time  . clonazePAM (KLONOPIN) 0.5 MG tablet Take 1 tablet (0.5 mg total) by mouth 2 (two) times daily. (Patient not taking: Reported on 01/12/2015) 30 tablet 0 Not Taking at Unknown time  . divalproex (DEPAKOTE ER) 500 MG 24 hr tablet Take 1 tablet (500 mg total) by mouth 2 (two) times daily. (Patient not taking: Reported on 01/12/2015) 60 tablet 2 Not Taking at Unknown time  . ipratropium-albuterol (DUONEB) 0.5-2.5 (3) MG/3ML SOLN Take 3 mLs by nebulization every 6 (six) hours as needed. (Patient not taking: Reported on 01/12/2015) 360 mL 1 Not Taking at Unknown time  . lisinopril (PRINIVIL,ZESTRIL) 2.5 MG tablet Take 1 tablet (2.5 mg total) by mouth daily. (Patient not taking: Reported on 01/12/2015) 30 tablet 2 Not Taking at Unknown time  . pantoprazole (PROTONIX) 40 MG tablet Take 1 tablet (40 mg total) by mouth daily at 12 noon. (Patient not taking: Reported on 01/12/2015) 30 tablet 2 Not Taking at Unknown time  . QUEtiapine (SEROQUEL) 25 MG tablet Take 1 tablet (25 mg total) by mouth 2 (two) times daily. (Patient not taking: Reported on 01/12/2015) 60 tablet 0 Not Taking at Unknown time  . ticagrelor (BRILINTA) 90 MG TABS tablet Take 1 tablet (90 mg total) by mouth 2 (two) times daily. (Patient not taking: Reported on 01/12/2015) 60 tablet 3 Not Taking at Unknown time  . ziprasidone (GEODON)  injection Inject 10 mg into the muscle 4 (four) times daily as needed for agitation (maximum dose 20 mg for 24 hours only.). (Patient not taking: Reported on 01/12/2015) 100 each 2 Not Taking at Unknown time   Scheduled:  . aspirin  81 mg Oral Daily  . atorvastatin  80 mg Oral q1800  . carvedilol  6.25 mg Oral BID WC  . [START ON 01/13/2015] digoxin  0.125 mg Oral Daily   Infusions:  . sodium chloride 10 mL/hr at 01/11/15 2030  . amiodarone 60 mg/hr (01/12/15 2000)  . heparin 1,000 Units/hr (01/12/15 2000)    Assessment: Initial heparin level was delayed due to IV infiltration .  Heparin level = <0.10 on 1000 units/hr, level drawn ~12 hours after heparin started for new Afib with RVR.  Subtherapeutic heparin level currently in this 69yo male admitted s/p Vfib arrest likely 2/2 RCA lesion. He was under cooling protocol and has been fully rewarmed; rewarming started at ~6pm on 01/10/15.  Heparin IV started this morning 01/12/15. Anemia, chronic:  Hgb 12.4, pltc 353K. No bleeding reported.   Cardiology considering cardiac cath tomorrow if fevers do not persist  Goal of Therapy:  Heparin level 0.3-0.7 units/ml Monitor platelets by anticoagulation protocol: Yes   Plan:  Give heparin bolus of 2500 units IV bolus  Increase heparin drip rate to 1200 units/hr and monitor heparin levels and CBC.   Noah Delaineuth Henley Boettner, RPh Clinical Pharmacist Pager: [phone] 01/12/2015,9:47 PM

## 2015-01-12 NOTE — Progress Notes (Signed)
Report to Zenaida Niecelatroya RN; MD aware of pt's lower arterial BP; Arterial line pulled per orders; pt alert and aware of transfer; will continue to monitor

## 2015-01-12 NOTE — Progress Notes (Signed)
Patient shivering affecting heart rate reading on monitor.  Called MD Molli KnockYacoub and discussed same. Ivery QualeJackson, Bryam Taborda A, RN

## 2015-01-12 NOTE — Progress Notes (Signed)
ANTICOAGULATION CONSULT NOTE - Initial Consult  Pharmacy Consult for heparin Indication: atrial fibrillation  Allergies  Allergen Reactions  . Aspirin Nausea And Vomiting    Patient Measurements: Height: 6\' 2"  (188 cm) Weight: 153 lb 14.5 oz (69.81 kg) IBW/kg (Calculated) : 82.2  Vital Signs: Temp: 99.1 F (37.3 C) (01/21 0600) Temp Source: Core (Comment) (01/21 0600) BP: 114/80 mmHg (01/21 0400) Pulse Rate: 147 (01/21 0600)  Labs:  Recent Labs  01/09/15 1411 01/09/15 1420  01/09/15 2154  01/10/15 0227  01/10/15 0427  01/10/15 0825  01/11/15 0230 01/11/15 0600 01/12/15 0440  HGB 12.5*  --   < >  --   < >  --   --  13.0  --   --   --   --  13.4 12.6*  HCT 39.7  --   < >  --   < >  --   --  38.7*  --   --   --   --  40.2 38.8*  PLT 223  --   --   --   --   --   --  303  --   --   --   --  365 374  APTT 35  --   --  43*  --   --   --   --   --   --   --   --   --   --   LABPROT 14.9  --   --  14.2  --   --   --   --   --   --   --   --   --   --   INR 1.16  --   --  1.08  --   --   --   --   --   --   --   --   --   --   CREATININE 1.10  --   < >  --   < >  --   < >  --   < >  --   < > 0.82 1.00 1.07  TROPONINI  --  <0.03  --   --   --  0.19*  --   --   --  0.13*  --   --   --   --   < > = values in this interval not displayed.  Estimated Creatinine Clearance: 67 mL/min (by C-G formula based on Cr of 1.07).   Medical History: Past Medical History  Diagnosis Date  . Smoker   . Cardiac arrest     10/2014  . MI (myocardial infarction)     10/2014 - PCI to RCA  . Ischemic cardiomyopathy   . Polysubstance abuse     Medications:  Prescriptions prior to admission  Medication Sig Dispense Refill Last Dose  . amiodarone (PACERONE) 200 MG tablet Take 1 tablet (200 mg total) by mouth daily. 30 tablet 3   . amiodarone (PACERONE) 400 MG tablet Take 1 tablet (400 mg total) by mouth 2 (two) times daily. 30 tablet 0   . amiodarone (PACERONE) 400 MG tablet Take 1 tablet  (400 mg total) by mouth daily. 7 tablet 0   . aspirin 81 MG chewable tablet Chew 1 tablet (81 mg total) by mouth daily. 30 tablet 2   . atorvastatin (LIPITOR) 80 MG tablet Take 1 tablet (80 mg total) by mouth daily at 6 PM. 30 tablet 2   . carvedilol (  COREG) 6.25 MG tablet Take 1 tablet (6.25 mg total) by mouth 2 (two) times daily with a meal. 60 tablet 2   . clonazePAM (KLONOPIN) 0.5 MG tablet Take 1 tablet (0.5 mg total) by mouth 2 (two) times daily. 30 tablet 0   . divalproex (DEPAKOTE ER) 500 MG 24 hr tablet Take 1 tablet (500 mg total) by mouth 2 (two) times daily. 60 tablet 2   . ipratropium-albuterol (DUONEB) 0.5-2.5 (3) MG/3ML SOLN Take 3 mLs by nebulization every 6 (six) hours as needed. 360 mL 1   . lisinopril (PRINIVIL,ZESTRIL) 2.5 MG tablet Take 1 tablet (2.5 mg total) by mouth daily. 30 tablet 2   . pantoprazole (PROTONIX) 40 MG tablet Take 1 tablet (40 mg total) by mouth daily at 12 noon. 30 tablet 2   . QUEtiapine (SEROQUEL) 25 MG tablet Take 1 tablet (25 mg total) by mouth 2 (two) times daily. 60 tablet 0   . ticagrelor (BRILINTA) 90 MG TABS tablet Take 1 tablet (90 mg total) by mouth 2 (two) times daily. 60 tablet 3   . ziprasidone (GEODON) injection Inject 10 mg into the muscle 4 (four) times daily as needed for agitation (maximum dose 20 mg for 24 hours only.). 100 each 2    Scheduled:  . antiseptic oral rinse  7 mL Mouth Rinse BID  . aspirin  81 mg Oral Daily  . atorvastatin  80 mg Oral q1800  . carvedilol  6.25 mg Oral BID WC  . digoxin  0.25 mg Intravenous Once  . [START ON 01/13/2015] digoxin  0.125 mg Oral Daily  . enalapril  2.5 mg Oral BID  . heparin  5,000 Units Subcutaneous 3 times per day  . sodium chloride  500 mL Intravenous Once   Infusions:  . sodium chloride 10 mL/hr at 01/11/15 2030  . amiodarone 30 mg/hr (01/12/15 0500)    Assessment: 69yo male admitted s/p Vfib arrest likely 2/2 RCA lesion, was under cooling protocol and now fully rewarmed and  following commands, now in Afib w/ RVR, to begin heparin.  Goal of Therapy:  Heparin level 0.3-0.7 units/ml Monitor platelets by anticoagulation protocol: Yes   Plan:  Will give heparin 3000 units IV bolus x1 followed by gtt at 1000 units/hr and monitor heparin levels and CBC.  Vernard Gambles, PharmD, BCPS  01/12/2015,7:37 AM

## 2015-01-12 NOTE — Progress Notes (Signed)
PULMONARY / CRITICAL CARE MEDICINE   Name: [name] MRN: [id number] DOB: [birth year]    ADMISSION DATE:  01/09/2015 CONSULTATION DATE:  01/09/2015  REFERRING MD :  EDP  CHIEF COMPLAINT:  Cardiac arrest  INITIAL PRESENTATION: 69 year old male with history of recent VF arrest in 10/2014 2nd to RCA lesion. Presents again to ED 1/18 with VF arrest. ROSC after 8 mins ACLS. Total downtime ~20 mins. PCCM to admit.   STUDIES:   SIGNIFICANT EVENTS: 11/5 - 11/14 > admission for VF arrest, underwent hypothermia and PCI to RCA 1/18 CVL left IF >>> 1/18 A-Line left Radial >>> 1/18 EEG normal 1/19 Repeat ECHO 25-30% with inferior wall hypokinesis. Unchanged from 10/2014 1/20 Self extubated 1/20 developed Afib with RVR >> started amio, coreg, diltiazem and digoxin   SUBJECTIVE:  Went into Afib with RVR Breathing okay Shivering with hard to control temp with hypothermia protocol   VITAL SIGNS: Temp:  [98.1 F (36.7 C)-99.5 F (37.5 C)] 99.1 F (37.3 C) (01/21 0600) Pulse Rate:  [69-153] 147 (01/21 0600) Resp:  [7-26] 24 (01/21 0600) BP: (104-160)/(68-114) 114/80 mmHg (01/21 0400) SpO2:  [91 %-100 %] 96 % (01/21 0600) Arterial Line BP: (93-153)/(52-91) 123/60 mmHg (01/21 0600) FiO2 (%):  [100 %] 100 % (01/20 1200) HEMODYNAMICS: CVP:  [3 mmHg-8 mmHg] 5 mmHg VENTILATOR SETTINGS: Vent Mode:  [-]  FiO2 (%):  [100 %] 100 % INTAKE / OUTPUT:  Intake/Output Summary (Last 24 hours) at 01/12/15 0820 Last data filed at 01/12/15 0800  Gross per 24 hour  Intake 2341.36 ml  Output   1710 ml  Net 631.36 ml    PHYSICAL EXAMINATION: General:  Thin black male, Not in acute distress. Shivering  Neuro:  Awake, follows commands, responds to questions.  HEENT:  [place]/AT, No JVD noted. Cardiovascular:  Tachy, irreg irreg. No MRG noted Lungs:  CTA bilaterally Abdomen:  Soft, non-distended Musculoskeletal:  No edema. Moves extremities. No acute deformity Skin:  Skin is dry, flaky lower  extremities.   LABS:  CBC  Recent Labs Lab 01/10/15 0427 01/11/15 0600 01/12/15 0440  WBC 5.7 21.4* 24.0*  HGB 13.0 13.4 12.6*  HCT 38.7* 40.2 38.8*  PLT 303 365 374   Coag's  Recent Labs Lab 01/09/15 1411 01/09/15 2154  APTT 35 43*  INR 1.16 1.08   BMET  Recent Labs Lab 01/11/15 0230 01/11/15 0600 01/12/15 0440  NA 142 145 145  K 3.8 4.2 4.3  CL 110 110 111  CO2 20 22 24   BUN 15 16 17   CREATININE 0.82 1.00 1.07  GLUCOSE 105* 100* 77   Electrolytes  Recent Labs Lab 01/10/15 0427  01/11/15 0230 01/11/15 0600 01/12/15 0440  CALCIUM  --   < > 9.0 9.1 8.9  MG 1.9  --   --  1.7 2.2  PHOS 2.6  --   --  5.5* 3.6  < > = values in this interval not displayed. Sepsis Markers  Recent Labs Lab 01/09/15 1422 01/10/15 0530  LATICACIDVEN 7.78* 1.3   ABG  Recent Labs Lab 01/09/15 1514 01/10/15 0359  PHART 7.390 7.473*  PCO2ART 38.0 33.2*  PO2ART 435.0* 424.0*   Liver Enzymes  Recent Labs Lab 01/12/15 0440  AST 43*  ALT 29  ALKPHOS 98  BILITOT 0.6  ALBUMIN 2.5*   Cardiac Enzymes  Recent Labs Lab 01/09/15 1420 01/10/15 0227 01/10/15 0825  TROPONINI <0.03 0.19* 0.13*   Glucose  Recent Labs Lab 01/10/15 1709 01/10/15 2039 01/11/15 0039  01/11/15 0430 01/11/15 0725 01/11/15 1142  GLUCAP 95 91 91 91 89 90    Imaging Dg Chest Port 1 View  01/11/2015   CLINICAL DATA:  Check endotracheal tube placement  EXAM: PORTABLE CHEST - 1 VIEW  COMPARISON:  01/10/2015  FINDINGS: The endotracheal tube is noted 6 cm above the carina and stable. A right jugular line is seen just above the cavoatrial junction. A nasogastric catheter courses towards the stomach. The cardiac shadow is stable. The lungs are well aerated bilaterally. No acute bony abnormality is seen.  IMPRESSION: Tubes and lines stable in appearance.  No acute abnormality is seen.   Electronically Signed   By: Alcide Clever M.D.   On: 01/11/2015 07:24     ASSESSMENT /  PLAN:  PULMONARY OETT 1/18>>>1/20 self extubated A:  Acute hypoxemic respiratory failure  P:   On 5L [place]  Breathing normally  CARDIOVASCULAR CVL 1/18 >>> A-line 1/18 >> A:  VF arrest Ischemic cardiomyopathy - LVEF 25% CAD Hypertensive Emergency >improved. Repeat ECHO 25-30% with inferior wall hypokinesis. Unchanged from 10/2014 Low CVP 2 Atrial fibrillation with RVR P:  D/c cardizem 1/21 IV fluids 500 bolus this a.m 1/21 Cont with amiodarone, digoxin and coreg per cards recs Cont with heparin gtt, will be a poor candidate for oral anticoagulants.  MAP goal > 65 mm/Hg SBP goal < 160 PRN labetalol to achieve SBP goal Will d/c normothermia phase at 9:30 1/21 Cardiology considering cardiac cath tomorrow if fevers do not persist ? Whether he would be a candidate for AICD - there may be social barriers to this  RENAL A:   Metabolic acidosis, lactic Hypokalemia > resolved  Low urine output P:   Give a 500 NS bolus now, will reassess.  Caution with IVF due to CHF D/c NS 125 on 1/21 Monitor urine output closely  Monitor BMET  GASTROINTESTINAL A:   No acute issues  P:   Able to have sips of water Will require a formal swallowing eval 1/21  HEMATOLOGIC A:   Anemia, chronic Leukocytosis.  Possible fevers with temp of 99.1 while being cooled   P:  Follow CBC Heparin for DVT ppx  INFECTIOUS A:   No acute issues Leukocytosis No fevers.  P:   Will look for source of possible infection  Do cxr 1/21 Blood and urine culture 1/21>> Lactic acid, Pct level  Urinalysis  Tylenol   ENDOCRINE A:   No acute issues   P:   CBG monitoring and SSI  NEUROLOGIC A:   Acute encephalopathy > improving Cocaine abuse. UDS positive for cocaine  P:   Full awake this am  D/c sedation  EEG normal 1/18   Family: Updated son Linward Foster 1/20. No family this morning.  Case discussed with Dr Delton Coombes.   Signed:  Dow Adolph, MD PGY-3 Internal Medicine  Teaching Service Pager: [phone] 01/12/2015, 8:20 AM    Attending Note:  I have examined patient, reviewed labs, studies and notes. I have discussed the case with Dr Zada Girt, and I agree with the data and plans as amended above. He is s/p VF arrest, now exhibiting great neurological recovery. He had A Fib overnight, now with better rate control. Will need to decide timing of possible L heart cath, also discuss utility of AICD placement. Appreciated Cardiology's help. We will move to SDU bed today 1/21. Attempt to start diet and mobilize.   Levy Pupa, MD, PhD 01/12/2015, 9:55 AM [facility] [phone] or if  no answer [phone]

## 2015-01-12 NOTE — Evaluation (Signed)
Clinical/Bedside Swallow Evaluation Patient Details  Name: [name] MRN: [id number] Date of Birth: [birth year]  Today's Date: 01/12/2015 Time: SLP Start Time (ACUTE ONLY): 1035 SLP Stop Time (ACUTE ONLY): 1048 SLP Time Calculation (min) (ACUTE ONLY): 13 min  Past Medical History:  Past Medical History  Diagnosis Date  . Smoker   . Cardiac arrest     10/2014  . MI (myocardial infarction)     10/2014 - PCI to RCA  . Ischemic cardiomyopathy   . Polysubstance abuse    Past Surgical History:  Past Surgical History  Procedure Laterality Date  . Left heart catheterization with coronary angiogram N/A 10/27/2014    Procedure: LEFT HEART CATHETERIZATION WITH CORONARY ANGIOGRAM;  Surgeon: Kathleene Hazelhristopher D McAlhany, MD;  [facility];  Service: Cardiovascular;  Laterality: N/A;   HPI:  69 year old male with history of recent VF arrest in 10/2014 2nd to RCA lesion. Presents again to ED 1/18 with VF arrest. ROSC after 8 mins ACLS. Total downtime ~20 mins. Intubated from 1/18 to 1/20, pt self extubated. Hypothermia protocol.    Assessment / Plan / Recommendation Clinical Impression  Pt demonstrates strong swallow response, clear vocal quality and immediate evidence of aspiration. Pt does have a congested cough at baseline and was noted to have some coughing prior to POs and at end of session. Did not appear related to PO intake, Discussed findings with RN. Will initiate regular diet and thin liquids and sign off. RN will reorder if any concerns arise.     Aspiration Risk  Mild    Diet Recommendation Regular;Thin liquid   Liquid Administration via: Cup;Straw Medication Administration: Whole meds with liquid Supervision: Patient able to self feed Postural Changes and/or Swallow Maneuvers: Seated upright 90 degrees;Upright 30-60 min after meal    Other  Recommendations Oral Care Recommendations: Oral care BID   Follow Up Recommendations  None    Frequency and Duration         Pertinent Vitals/Pain NA    SLP Swallow Goals     Swallow Study Prior Functional Status       General HPI: 69 year old male with history of recent VF arrest in 10/2014 2nd to RCA lesion. Presents again to ED 1/18 with VF arrest. ROSC after 8 mins ACLS. Total downtime ~20 mins. Intubated from 1/18 to 1/20, pt self extubated. Hypothermia protocol.  Type of Study: Bedside swallow evaluation Previous Swallow Assessment: none Diet Prior to this Study: NPO Temperature Spikes Noted: No Respiratory Status: Nasal cannula History of Recent Intubation: Yes Length of Intubations (days): 3 days Date extubated: 01/11/15 Behavior/Cognition: Alert;Cooperative;Pleasant mood Oral Cavity - Dentition: Adequate natural dentition Self-Feeding Abilities: Able to feed self Patient Positioning: Upright in bed Baseline Vocal Quality: Clear (initially seemed hoarse, improved with effort) Volitional Cough: Strong;Congested Volitional Swallow: Able to elicit    Oral/Motor/Sensory Function Overall Oral Motor/Sensory Function: Appears within functional limits for tasks assessed   Ice Chips     Thin Liquid Thin Liquid: Within functional limits Presentation: Cup;Straw    Nectar Thick Nectar Thick Liquid: Not tested   Honey Thick Honey Thick Liquid: Not tested   Puree Puree: Within functional limits   Solid   GO    Solid: Within functional limits      [facility]aptist Medical CenterBonnie Riker Collier, MA CCC-SLP [phone]  Claudine MoutonDeBlois, Tracen Mahler Caroline 01/12/2015,11:01 AM

## 2015-01-12 NOTE — Progress Notes (Signed)
Patient ID: [name], male   DOB: [birth year], 69 y.o.   MRN: [id number]    SUBJECTIVE:    Patient extubated, awake and alert.  Shivering.  Probably febrile (T 99.5 being cooled).  WBCs high.  He went into atrial fibrillation with RVR last night.    Echo 01/09/15 EF 25-30%  (was 20-25% in 11/15)  Scheduled Meds: . antiseptic oral rinse  7 mL Mouth Rinse BID  . aspirin  81 mg Oral Daily  . atorvastatin  80 mg Oral q1800  . carvedilol  6.25 mg Oral BID WC  . digoxin  0.25 mg Intravenous Once  . [START ON 01/13/2015] digoxin  0.125 mg Oral Daily  . enalapril  2.5 mg Oral BID  . heparin  5,000 Units Subcutaneous 3 times per day  . sodium chloride  500 mL Intravenous Once   Continuous Infusions: . sodium chloride 10 mL/hr at 01/11/15 2030  . amiodarone 30 mg/hr (01/12/15 0500)   PRN Meds:.acetaminophen, fentaNYL, metoprolol, ondansetron (ZOFRAN) IV    Filed Vitals:   01/12/15 0300 01/12/15 0400 01/12/15 0500 01/12/15 0600  BP:  114/80    Pulse: 153 113 150 147  Temp:  99.5 F (37.5 C)  99.1 F (37.3 C)  TempSrc:  Core (Comment)  Core (Comment)  Resp: 19 22 26 24   Height:      Weight:      SpO2: 96% 91% 93% 96%    Intake/Output Summary (Last 24 hours) at 01/12/15 0738 Last data filed at 01/12/15 0600  Gross per 24 hour  Intake 2284.66 ml  Output   1735 ml  Net 549.66 ml    LABS: Basic Metabolic Panel:  Recent Labs  40/98/11 0600 01/12/15 0440  NA 145 145  K 4.2 4.3  CL 110 111  CO2 22 24  GLUCOSE 100* 77  BUN 16 17  CREATININE 1.00 1.07  CALCIUM 9.1 8.9  MG 1.7 2.2  PHOS 5.5* 3.6   Liver Function Tests:  Recent Labs  01/12/15 0440  AST 43*  ALT 29  ALKPHOS 98  BILITOT 0.6  PROT 7.0  ALBUMIN 2.5*   No results for input(s): LIPASE, AMYLASE in the last 72 hours. CBC:  Recent Labs  01/09/15 1411  01/11/15 0600 01/12/15 0440  WBC 6.5  < > 21.4* 24.0*  NEUTROABS 3.7  --   --   --   HGB 12.5*  < > 13.4 12.6*  HCT 39.7  < > 40.2 38.8*    MCV 88.8  < > 83.6 86.0  PLT 223  < > 365 374  < > = values in this interval not displayed. Cardiac Enzymes:  Recent Labs  01/09/15 1420 01/10/15 0227 01/10/15 0825  TROPONINI <0.03 0.19* 0.13*   BNP: Invalid input(s): POCBNP D-Dimer: No results for input(s): DDIMER in the last 72 hours. Hemoglobin A1C: No results for input(s): HGBA1C in the last 72 hours. Fasting Lipid Panel: No results for input(s): CHOL, HDL, LDLCALC, TRIG, CHOLHDL, LDLDIRECT in the last 72 hours. Thyroid Function Tests: No results for input(s): TSH, T4TOTAL, T3FREE, THYROIDAB in the last 72 hours.  Invalid input(s): FREET3 Anemia Panel: No results for input(s): VITAMINB12, FOLATE, FERRITIN, TIBC, IRON, RETICCTPCT in the last 72 hours.  RADIOLOGY: Ct Head Wo Contrast  01/09/2015   CLINICAL DATA:  Initial encounter for cardiac arrest.  EXAM: CT HEAD WITHOUT CONTRAST  CT CERVICAL SPINE WITHOUT CONTRAST  TECHNIQUE: Multidetector CT imaging of the head and cervical spine was performed following  the standard protocol without intravenous contrast. Multiplanar CT image reconstructions of the cervical spine were also generated.  COMPARISON:  None.  FINDINGS: CT HEAD FINDINGS  Sinuses/Soft tissues: Minimal mucosal thickening of ethmoid air cells and left sphenoid sinus. Clear mastoid air cells.  Intracranial: No mass lesion, hemorrhage, hydrocephalus, acute infarct, intra-axial, or extra-axial fluid collection.  CT CERVICAL SPINE FINDINGS  Spinal visualization through the bottom of T2. Prevertebral soft tissues not well evaluated secondary to endotracheal and nasogastric tubes. Mild loss of vertebral body height at C4 through C6. Likely within normal variation/chronic. Secretions in the nasopharynx and oral pharynx are also likely related to endotracheal tube in place. No apical pneumothorax. Biapical centrilobular and paraseptal emphysema.  Advanced cervical spondylosis. This results in areas of the central canal stenosis  at C4-5 through C6-7. Bilateral neural foraminal narrowing at C5-6 and C6-7. Straightening of expected lordosis.  Skull base intact. Facets are well-aligned. Coronal reformats demonstrate only degenerative irregularity about the C1-2 articulation.  IMPRESSION: 1.  No acute intracranial abnormality. 2. Advanced cervical spondylosis, without acute finding. 3. Degraded evaluation for prevertebral soft tissue swelling, secondary to endotracheal tube in place. 4. Straightening of expected cervical lordosis could be positional, due to muscular spasm, or ligamentous injury. 5. Sinus disease   Electronically Signed   By: Jeronimo Greaves M.D.   On: 01/09/2015 15:03   Ct Cervical Spine Wo Contrast  01/09/2015   CLINICAL DATA:  Initial encounter for cardiac arrest.  EXAM: CT HEAD WITHOUT CONTRAST  CT CERVICAL SPINE WITHOUT CONTRAST  TECHNIQUE: Multidetector CT imaging of the head and cervical spine was performed following the standard protocol without intravenous contrast. Multiplanar CT image reconstructions of the cervical spine were also generated.  COMPARISON:  None.  FINDINGS: CT HEAD FINDINGS  Sinuses/Soft tissues: Minimal mucosal thickening of ethmoid air cells and left sphenoid sinus. Clear mastoid air cells.  Intracranial: No mass lesion, hemorrhage, hydrocephalus, acute infarct, intra-axial, or extra-axial fluid collection.  CT CERVICAL SPINE FINDINGS  Spinal visualization through the bottom of T2. Prevertebral soft tissues not well evaluated secondary to endotracheal and nasogastric tubes. Mild loss of vertebral body height at C4 through C6. Likely within normal variation/chronic. Secretions in the nasopharynx and oral pharynx are also likely related to endotracheal tube in place. No apical pneumothorax. Biapical centrilobular and paraseptal emphysema.  Advanced cervical spondylosis. This results in areas of the central canal stenosis at C4-5 through C6-7. Bilateral neural foraminal narrowing at C5-6 and C6-7.  Straightening of expected lordosis.  Skull base intact. Facets are well-aligned. Coronal reformats demonstrate only degenerative irregularity about the C1-2 articulation.  IMPRESSION: 1.  No acute intracranial abnormality. 2. Advanced cervical spondylosis, without acute finding. 3. Degraded evaluation for prevertebral soft tissue swelling, secondary to endotracheal tube in place. 4. Straightening of expected cervical lordosis could be positional, due to muscular spasm, or ligamentous injury. 5. Sinus disease   Electronically Signed   By: Jeronimo Greaves M.D.   On: 01/09/2015 15:03   Dg Chest Port 1 View  01/11/2015   CLINICAL DATA:  Check endotracheal tube placement  EXAM: PORTABLE CHEST - 1 VIEW  COMPARISON:  01/10/2015  FINDINGS: The endotracheal tube is noted 6 cm above the carina and stable. A right jugular line is seen just above the cavoatrial junction. A nasogastric catheter courses towards the stomach. The cardiac shadow is stable. The lungs are well aerated bilaterally. No acute bony abnormality is seen.  IMPRESSION: Tubes and lines stable in appearance.  No acute abnormality is  seen.   Electronically Signed   By: Alcide CleverMark  Lukens M.D.   On: 01/11/2015 07:24   Dg Chest Port 1 View  01/10/2015   CLINICAL DATA:  Hypoxia  EXAM: PORTABLE CHEST - 1 VIEW  COMPARISON:  January 09, 2015  FINDINGS: Endotracheal tube tip is 5.1 cm above the carina. Central catheter tip is in the superior vena cava. Nasogastric tube tip and side port are below the diaphragm. No pneumothorax. There is no edema or consolidation. Heart size and pulmonary vascularity are within normal limits. No adenopathy. No bone lesions.  IMPRESSION: Tube and catheter positions as described without pneumothorax. No edema or consolidation.   Electronically Signed   By: Bretta BangWilliam  Woodruff M.D.   On: 01/10/2015 07:17   Dg Chest Port 1 View  01/09/2015   CLINICAL DATA:  PICC/central line placement.  Shortness of breath.  EXAM: PORTABLE CHEST - 1 VIEW   COMPARISON:  01/09/2015 and 07/20/2009.  FINDINGS: Endotracheal tube with tip 7.6 cm above the carina. Enteric tube courses into the region of the stomach and off the film as tip is not visualized. Right IJ central venous catheter has tip over the SVC. External defibrillator over the left chest. Lungs are adequately inflated without consolidation or effusion. Minimal stable prominence of the central vascular markings. Cardiomediastinal silhouette and remainder the exam is unchanged. There is no evidence of right-sided pneumothorax.  IMPRESSION: Suggestion mild vascular congestion.  Tubes and lines as described.   Electronically Signed   By: Elberta Fortisaniel  Boyle M.D.   On: 01/09/2015 15:44   Dg Chest Portable 1 View  01/09/2015   CLINICAL DATA:  CPR  EXAM: PORTABLE CHEST - 1 VIEW  COMPARISON:  07/20/2009  FINDINGS: Normal heart size. Endotracheal tube is 6.2 cm from the carina. NG tube is in the fundus of the stomach. Clear lungs. No pneumothorax. No pleural effusion. Normal vascularity.  IMPRESSION: Endotracheal and NG tubes as described.  Clear lungs.   Electronically Signed   By: Maryclare BeanArt  Hoss M.D.   On: 01/09/2015 14:43    PHYSICAL EXAM General: NAD Neck: No JVD, no thyromegaly or thyroid nodule.  Lungs: Coarse breath sounds CV: Nondisplaced breath sounds.PMI.  Tachy irregular  No peripheral edema.  No carotid bruit.  Normal pedal pulses.  Abdomen: Soft, no hepatosplenomegaly, no distention.  Neurologic: Awake/alert Extremities: No clubbing or cyanosis.   TELEMETRY: ST 120  ASSESSMENT AND PLAN: 69 yo homeless man with history of CAD s/p inferior MI and PTCA in 11/15 as well as cocaine abuse presented with ventricular fibrillation arrest.  1. Vfib arrest: Patient collapsed on street, was down about 20 minutes prior to defibrillation and ROSC.  Cocaine negative in urine.  ECG unchanged (old inferoposterior MI), troponin minimally elevated.  Likely scar-related vfib, think less likely new ACS.  - He will  need cardiac cath, possibly tomorrow depending on what fever curve does. Will need to consider ICD for secondary prevention but social situation makes this very difficult. No recurrent VT/VF on monitor.  - Continue amiodarone.  2. CAD: TnI minimally elevated, ECG stable with old inferoposterior MI.  As above, may be scar-related vfib rather than new ACS.   - Continue ASA/statin.  - Will need cardiac cath, possibly tomorrow.  3. Chronic systolic CHF: Ischemic cardiomyopathy.  Echo with stable EF 25-30%.  CVP 5.  - Co-ox is lower today but now in afib with RVR and on diltiazem gtt.  - Continue Coreg/enalapril, will add digoxin.  4. Atrial fibrillation: With RVR.  Started 1/20 pm.  Now on diltiazem and amiodarone gtts.  - Stop diltiazem given cardiomyopathy.  - Increase amiodarone to 60 mg/hr for now.  - Continue Coreg and will add digoxin 0.25 IV today then 0.125 daily.  - Heparin gtt for now.  Probably a poor candidate for long-term anticoagulation, but if we need to cardiovert him this admission could probably manage at least a month of Eliquis.  Will not start oral anticoagulation at this time as he will need a cardiac cath also.  5. ID: WBCs 24 and temp 99.5 despite cooling.  Concern for infection.  - CXR today - per CCM  The patient is critically ill with multiple organ systems failure and requires high complexity decision making for assessment and support, frequent evaluation and titration of therapies, application of advanced monitoring technologies and extensive interpretation of multiple databases.   Critical Care Time devoted to patient care services described in this note is 35 Minutes.  Marca Ancona MD 01/12/2015 7:38 AM

## 2015-01-13 ENCOUNTER — Encounter (HOSPITAL_COMMUNITY)
Admission: EM | Disposition: A | Discharge status: Home / Self Care | Payer: Self-pay | Source: Home / Self Care | Attending: Pulmonary Disease

## 2015-01-13 ENCOUNTER — Encounter (HOSPITAL_COMMUNITY): Payer: Self-pay | Admitting: Interventional Cardiology

## 2015-01-13 DIAGNOSIS — J189 Pneumonia, unspecified organism: Secondary | ICD-10-CM

## 2015-01-13 DIAGNOSIS — I4901 Ventricular fibrillation: Secondary | ICD-10-CM | POA: Insufficient documentation

## 2015-01-13 DIAGNOSIS — I2510 Atherosclerotic heart disease of native coronary artery without angina pectoris: Secondary | ICD-10-CM | POA: Insufficient documentation

## 2015-01-13 DIAGNOSIS — I480 Paroxysmal atrial fibrillation: Secondary | ICD-10-CM

## 2015-01-13 HISTORY — PX: LEFT HEART CATHETERIZATION WITH CORONARY ANGIOGRAM: SHX5451

## 2015-01-13 LAB — HEPARIN LEVEL (UNFRACTIONATED): Heparin Unfractionated: <0.1 IU/mL — ABNORMAL LOW (ref 0.30–0.70)

## 2015-01-13 LAB — CARBOXYHEMOGLOBIN
CARBOXYHEMOGLOBIN: 0.8 % (ref 0.5–1.5)
Methemoglobin: 1.3 % (ref 0.0–1.5)
O2 Saturation: 78.5 %
TOTAL HEMOGLOBIN: 11.3 g/dL — AB (ref 13.5–18.0)

## 2015-01-13 LAB — BASIC METABOLIC PANEL
ANION GAP: 7 (ref 5–15)
BUN: 17 mg/dL (ref 6–23)
CO2: 25 mmol/L (ref 19–32)
CREATININE: 1.19 mg/dL (ref 0.50–1.35)
Calcium: 8.6 mg/dL (ref 8.4–10.5)
Chloride: 107 mEq/L (ref 96–112)
GFR calc Af Amer: 71 mL/min — ABNORMAL LOW (ref >90)
GFR calc non Af Amer: 61 mL/min — ABNORMAL LOW (ref >90)
GLUCOSE: 107 mg/dL — AB (ref 70–99)
Potassium: 3.6 mmol/L (ref 3.5–5.1)
SODIUM: 139 mmol/L (ref 135–145)

## 2015-01-13 LAB — CBC
HCT: 33.6 % — ABNORMAL LOW (ref 39.0–52.0)
Hemoglobin: 11.2 g/dL — ABNORMAL LOW (ref 13.0–17.0)
MCH: 28.4 pg (ref 26.0–34.0)
MCHC: 33.3 g/dL (ref 30.0–36.0)
MCV: 85.1 fL (ref 78.0–100.0)
Platelets: 306 10*3/uL (ref 150–400)
RBC: 3.95 MIL/uL — AB (ref 4.22–5.81)
RDW: 14.5 % (ref 11.5–15.5)
WBC: 15.4 10*3/uL — ABNORMAL HIGH (ref 4.0–10.5)

## 2015-01-13 LAB — PROCALCITONIN: PROCALCITONIN: 17.56 ng/mL

## 2015-01-13 SURGERY — LEFT HEART CATHETERIZATION WITH CORONARY ANGIOGRAM
Anesthesia: LOCAL

## 2015-01-13 MED ORDER — VERAPAMIL HCL 2.5 MG/ML IV SOLN
INTRAVENOUS | Status: AC
  Filled 2015-01-13: qty 2

## 2015-01-13 MED ORDER — ASPIRIN 81 MG PO CHEW: 81.0000 mg | CHEWABLE_TABLET | ORAL | Status: DC

## 2015-01-13 MED ORDER — NITROGLYCERIN 1 MG/10 ML FOR IR/CATH LAB
INTRA_ARTERIAL | Status: AC
  Filled 2015-01-13: qty 10

## 2015-01-13 MED ORDER — SODIUM CHLORIDE 0.9 % IJ SOLN
3.0000 mL | Freq: Two times a day (BID) | INTRAMUSCULAR | Status: DC
  Administered 2015-01-13: 3 mL via INTRAVENOUS

## 2015-01-13 MED ORDER — POTASSIUM CHLORIDE CRYS ER 20 MEQ PO TBCR
40.0000 meq | EXTENDED_RELEASE_TABLET | Freq: Once | ORAL | Status: AC
  Administered 2015-01-13: 40 meq via ORAL
  Filled 2015-01-13: qty 2

## 2015-01-13 MED ORDER — ENOXAPARIN SODIUM 40 MG/0.4ML ~~LOC~~ SOLN
40.0000 mg | SUBCUTANEOUS | Status: DC
  Administered 2015-01-14 – 2015-01-18 (×4): 40 mg via SUBCUTANEOUS
  Filled 2015-01-13 (×7): qty 0.4

## 2015-01-13 MED ORDER — HEPARIN SODIUM (PORCINE) 1000 UNIT/ML IJ SOLN
INTRAMUSCULAR | Status: AC
  Filled 2015-01-13: qty 1

## 2015-01-13 MED ORDER — SODIUM CHLORIDE 0.9 % IJ SOLN
10.0000 mL | Freq: Two times a day (BID) | INTRAMUSCULAR | Status: DC
  Administered 2015-01-13: 20 mL

## 2015-01-13 MED ORDER — SODIUM CHLORIDE 0.9 % IJ SOLN: 10.0000 mL | INTRAMUSCULAR | Status: DC | PRN

## 2015-01-13 MED ORDER — HEPARIN (PORCINE) IN NACL 2-0.9 UNIT/ML-% IJ SOLN
INTRAMUSCULAR | Status: AC
  Filled 2015-01-13: qty 1500

## 2015-01-13 MED ORDER — MIDAZOLAM HCL 2 MG/2ML IJ SOLN
INTRAMUSCULAR | Status: AC
  Filled 2015-01-13: qty 2

## 2015-01-13 MED ORDER — SODIUM CHLORIDE 0.9 % IV SOLN: 250.0000 mL | INTRAVENOUS | Status: DC | PRN

## 2015-01-13 MED ORDER — AMIODARONE HCL 200 MG PO TABS
200.0000 mg | ORAL_TABLET | Freq: Two times a day (BID) | ORAL | Status: DC
  Administered 2015-01-13 – 2015-01-16 (×6): 200 mg via ORAL
  Filled 2015-01-13 (×9): qty 1

## 2015-01-13 MED ORDER — LIDOCAINE HCL (PF) 1 % IJ SOLN
INTRAMUSCULAR | Status: AC
  Filled 2015-01-13: qty 30

## 2015-01-13 MED ORDER — DEXTROSE 5 % IV SOLN
1.0000 g | Freq: Three times a day (TID) | INTRAVENOUS | Status: DC
  Administered 2015-01-13 – 2015-01-18 (×14): 1 g via INTRAVENOUS
  Filled 2015-01-13 (×17): qty 1

## 2015-01-13 MED ORDER — VANCOMYCIN HCL IN DEXTROSE 750-5 MG/150ML-% IV SOLN
750.0000 mg | Freq: Two times a day (BID) | INTRAVENOUS | Status: DC
  Administered 2015-01-13 – 2015-01-16 (×6): 750 mg via INTRAVENOUS
  Filled 2015-01-13 (×7): qty 150

## 2015-01-13 MED ORDER — NOREPINEPHRINE BITARTRATE 1 MG/ML IV SOLN
INTRAVENOUS | Status: AC
  Filled 2015-01-13: qty 4

## 2015-01-13 MED ORDER — HEPARIN BOLUS VIA INFUSION
2000.0000 [IU] | Freq: Once | INTRAVENOUS | Status: AC
  Administered 2015-01-13: 2000 [IU] via INTRAVENOUS
  Filled 2015-01-13: qty 2000

## 2015-01-13 MED ORDER — FENTANYL CITRATE 0.05 MG/ML IJ SOLN
INTRAMUSCULAR | Status: AC
  Filled 2015-01-13: qty 2

## 2015-01-13 MED ORDER — SODIUM CHLORIDE 0.9 % IV SOLN
INTRAVENOUS | Status: AC
  Administered 2015-01-13: 16:00:00 via INTRAVENOUS

## 2015-01-13 MED ORDER — ACETAMINOPHEN 325 MG PO TABS
650.0000 mg | ORAL_TABLET | ORAL | Status: DC | PRN
  Administered 2015-01-16 – 2015-01-17 (×2): 650 mg via ORAL
  Filled 2015-01-13 (×2): qty 2

## 2015-01-13 MED ORDER — SODIUM CHLORIDE 0.9 % IJ SOLN: 3.0000 mL | INTRAMUSCULAR | Status: DC | PRN

## 2015-01-13 NOTE — Progress Notes (Signed)
ANTICOAGULATION CONSULT NOTE - Follow Up Consult  Pharmacy Consult for Heparin  Indication: atrial fibrillation  Allergies  Allergen Reactions  . Aspirin Nausea And Vomiting   Patient Measurements: Height: 6\' 2"  (188 cm) Weight: 153 lb 14.5 oz (69.81 kg) IBW/kg (Calculated) : 82.2  Vital Signs: Temp: 98.9 F (37.2 C) (01/22 0000) Temp Source: Oral (01/22 0000) BP: 118/77 mmHg (01/22 0600) Pulse Rate: 105 (01/22 0600)  Labs:  Recent Labs  01/10/15 0825  01/11/15 0600 01/12/15 0440 01/12/15 0835 01/12/15 2040 01/13/15 0500 01/13/15 0525  HGB  --   < > 13.4 12.6* 12.4*  --   --  11.2*  HCT  --   < > 40.2 38.8* 38.4*  --   --  33.6*  PLT  --   < > 365 374 353  --   --  306  HEPARINUNFRC  --   --   --   --   --  <0.10* <0.10*  --   CREATININE  --   < > 1.00 1.07  --   --   --  1.19  TROPONINI 0.13*  --   --   --   --   --   --   --   < > = values in this interval not displayed.  Estimated Creatinine Clearance: 59.5 mL/min (by C-G formula based on Cr of 1.19).  Assessment: Sub-therapeutic heparin level despite rate increase, no issues per RN.   Goal of Therapy:  Heparin level 0.3-0.7 units/ml Monitor platelets by anticoagulation protocol: Yes   Plan:  -Heparin 2000 units BOLUS -Increase heparin drip to 1500 units/hr -1400 HL -Daily CBC/HL -Monitor for bleeding  Abran DukeLedford, [place] 01/13/2015,6:32 AM

## 2015-01-13 NOTE — CV Procedure (Addendum)
     Left Heart Catheterization with Coronary Angiography  Report  Skip EstimableJames [name]  69 y.o.  male [birth year]  Procedure Date: 01/13/2015 Referring Physician: Gala RomneyBensimhon, MD Primary Cardiologist: Clifton JamesMcAlhany, MD  INDICATIONS: Ventricular fibrillation  PROCEDURE: 1. Left heart catheterization; 2. Coronary angiography; 3. Left ventriculography  CONSENT:  The risks, benefits, and details of the procedure were explained in detail to the patient. Risks including death, stroke, heart attack, kidney injury, allergy, limb ischemia, bleeding and radiation injury were discussed.  The patient verbalized understanding and wanted to proceed.  Informed written consent was obtained.  PROCEDURE TECHNIQUE:  After Xylocaine anesthesia a 5 French Slender sheath was placed in the right radial artery with an angiocath and the modified Seldinger technique.  Coronary angiography was done using a 5 F JR4 and JL 3.5 cm catheter.  Left ventriculography was done using the JR 4 diagnostic catheter and hand injection.   Images were reviewed. The distal right coronary is similar in appearance to post PCI in November. Patient has an inferobasal aneurysm. EF is reduced.   CONTRAST:  Total of 90 cc.  COMPLICATIONS:  None   HEMODYNAMICS:  Aortic pressure 103/59 mmHg mmHg; LV pressure 104/2 mmHg; LVEDP 17 mmHg  ANGIOGRAPHIC DATA:   The left main coronary artery is heavily calcified and widely patent.  The left anterior descending artery is heavily calcified, tortuous, with 50% mid stenosis.  The left circumflex artery is small in distribution and widely patent.  The ramus intermedius bifurcates, is calcified, is small to moderate in size, and widely patent..  The right coronary artery is heavily calcified and tortuous. There is 95-99% distal stenosis within an acute bend. There is 90% stenosis at the ostium of the PDA. Both are sites of prior intervention in November 2015. Current appearance is very similar to post PCI  appearance. Stenting was not performed before because of tortuosity and heavy vessel calcification preventing stent delivery.  LEFT VENTRICULOGRAM:  Left ventricular angiogram was done in the 30 RAO projection and revealed a large inferobasal aneurysm similar to the prior study. Presence of the aneurysm was suggestive the inferior wall is not viable. Intervention on the right coronary would be futile. The aneurysm likely serves a substrate for malignant ventricular arrhythmia. Ejection fraction 25%.   IMPRESSIONS:  1. High-grade distal RCA disease unchanged from the post PCI (PTCA only) images in November 2015. 2. 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 3. Widely patent left coronary 4. Chronic systolic heart failure with EF 25%   RECOMMENDATION:  Medical therapy of LV dysfunction.  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  Consider EP evaluation although social situation would make device management impractical.  PCI was not performed on the RCA as inferobasal region supplied by the RCA appears to be nonviable and aneurysmal..

## 2015-01-13 NOTE — Progress Notes (Signed)
ANTIBIOTIC CONSULT NOTE - INITIAL  Pharmacy Consult for Vancomycin and Cefepime Indication: HCAP vs bronchitis  Allergies  Allergen Reactions  . Aspirin Nausea And Vomiting    Patient Measurements: Height: 6\' 2"  (188 cm) Weight: 153 lb 14.5 oz (69.81 kg) IBW/kg (Calculated) : 82.2  Vital Signs: Temp: 99.3 F (37.4 C) (01/22 1108) Temp Source: Oral (01/22 1108) BP: 108/76 mmHg (01/22 1108) Pulse Rate: 88 (01/22 1108) Intake/Output from previous day: 01/21 0701 - 01/22 0700 In: 2312.9 [P.O.:1050; I.V.:1262.9] Out: 2325 [Urine:2325] Intake/Output from this shift: Total I/O In: 199.5 [I.V.:199.5] Out: 400 [Urine:400]  Labs:  Recent Labs  01/11/15 0600 01/12/15 0440 01/12/15 0835 01/13/15 0525  WBC 21.4* 24.0* 24.3* 15.4*  HGB 13.4 12.6* 12.4* 11.2*  PLT 365 374 353 306  CREATININE 1.00 1.07  --  1.19   Estimated Creatinine Clearance: 59.5 mL/min (by C-G formula based on Cr of 1.19).  Medical History: Past Medical History  Diagnosis Date  . Smoker   . Cardiac arrest     10/2014  . MI (myocardial infarction)     10/2014 - PCI to RCA  . Ischemic cardiomyopathy   . Polysubstance abuse    Assessment: 69yom known to pharmacy from heparin dosing now with leukocytosis and elevated PCT. He will begin empiric vancomycin and cefepime to cover for HCAP (CXR pending). Renal function wnl and stable.  1/21 blood x2>> ngtd 1/21 urine>>  Goal of Therapy:  Vancomycin trough level 15-20 mcg/ml  Plan:  1) Vancomycin 750mg  IV q12 2) Cefepime 1g IV q8 3) Follow renal function, cultures, LOT, level if needed  Fredrik RiggerMarkle, Shayda Kalka Sue 01/13/2015,1:11 PM

## 2015-01-13 NOTE — Progress Notes (Addendum)
Patient ID: Scott EstimableJames [name], male   DOB: 1946/01/08, 69 y.o.   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 Went into atrial fibrillation with RVR on 1/21 now in NSR on amio.  Denies CP. Says low back hurts. No further VT. Says he is coughing up a little blood.    Echo 01/09/15 EF 25-30%  (was 20-25% in 11/15)  Scheduled Meds: . aspirin  81 mg Oral Daily  . atorvastatin  80 mg Oral q1800  . carvedilol  6.25 mg Oral BID WC  . digoxin  0.125 mg Oral Daily  . sodium chloride  10-40 mL Intracatheter Q12H   Continuous Infusions: . sodium chloride 10 mL/hr at 01/12/15 2000  . amiodarone 60 mg/hr (01/13/15 1013)  . heparin 1,500 Units/hr (01/13/15 0714)   PRN Meds:.acetaminophen, fentaNYL, metoprolol, ondansetron (ZOFRAN) IV, sodium chloride    Filed Vitals:   01/13/15 0600 01/13/15 0700 01/13/15 0800 01/13/15 0931  BP: 118/77 103/68 131/56   Pulse: 105 101 103 101  Temp:  97.9 F (36.6 C)    TempSrc:  Oral    Resp: 29 27 27    Height:      Weight:      SpO2: 100% 98% 100%     Intake/Output Summary (Last 24 hours) at 01/13/15 1044 Last data filed at 01/13/15 0915  Gross per 24 hour  Intake 2128.93 ml  Output   2525 ml  Net -396.07 ml    LABS: Basic Metabolic Panel:  Recent Labs  78/29/5601/20/16 0600 01/12/15 0440 01/13/15 0525  NA 145 145 139  K 4.2 4.3 3.6  CL 110 111 107  CO2 22 24 25   GLUCOSE 100* 77 107*  BUN 16 17 17   CREATININE 1.00 1.07 1.19  CALCIUM 9.1 8.9 8.6  MG 1.7 2.2  --   PHOS 5.5* 3.6  --    Liver Function Tests:  Recent Labs  01/12/15 0440  AST 43*  ALT 29  ALKPHOS 98  BILITOT 0.6  PROT 7.0  ALBUMIN 2.5*   No results for input(s): LIPASE, AMYLASE in the last 72 hours. CBC:  Recent Labs  01/12/15 0835  01/13/15 0525  WBC 24.3* 15.4*  HGB 12.4* 11.2*  HCT 38.4* 33.6*  MCV 85.1 85.1  PLT 353 306   Cardiac Enzymes: No results for input(s): CKTOTAL, CKMB, CKMBINDEX, TROPONINI in the last 72 hours. BNP: Invalid input(s): POCBNP D-Dimer: No results for input(s): DDIMER in the last 72 hours. Hemoglobin A1C: No results for input(s): HGBA1C in the last 72 hours. Fasting Lipid Panel: No results for input(s): CHOL, HDL, LDLCALC, TRIG, CHOLHDL, LDLDIRECT in the last 72 hours. Thyroid Function Tests: No results for input(s): TSH, T4TOTAL, T3FREE, THYROIDAB in the last 72 hours.  Invalid input(s): FREET3 Anemia Panel: No results for input(s): VITAMINB12, FOLATE, FERRITIN, TIBC, IRON, RETICCTPCT in the last 72 hours.  RADIOLOGY: Ct Head Wo Contrast  01/09/2015   CLINICAL DATA:  Initial encounter for cardiac arrest.  EXAM: CT HEAD WITHOUT CONTRAST  CT CERVICAL SPINE WITHOUT CONTRAST  TECHNIQUE: Multidetector CT imaging of the head and cervical spine was performed following the standard protocol without intravenous contrast. Multiplanar CT image reconstructions of the cervical spine were also generated.  COMPARISON:  None.  FINDINGS: CT HEAD FINDINGS  Sinuses/Soft tissues: Minimal  mucosal thickening of ethmoid air cells and left sphenoid sinus. Clear mastoid air cells.  Intracranial: No mass lesion, hemorrhage, hydrocephalus, acute infarct, intra-axial, or extra-axial fluid collection.  CT CERVICAL SPINE FINDINGS  Spinal visualization through the bottom of T2. Prevertebral soft tissues not well evaluated secondary to endotracheal and nasogastric tubes. Mild loss of vertebral body height at C4 through C6. Likely within normal variation/chronic. Secretions in the nasopharynx and oral pharynx are also likely related to endotracheal tube in place. No apical pneumothorax. Biapical centrilobular and paraseptal emphysema.  Advanced cervical spondylosis. This results in areas of the central canal stenosis at  C4-5 through C6-7. Bilateral neural foraminal narrowing at C5-6 and C6-7. Straightening of expected lordosis.  Skull base intact. Facets are well-aligned. Coronal reformats demonstrate only degenerative irregularity about the C1-2 articulation.  IMPRESSION: 1.  No acute intracranial abnormality. 2. Advanced cervical spondylosis, without acute finding. 3. Degraded evaluation for prevertebral soft tissue swelling, secondary to endotracheal tube in place. 4. Straightening of expected cervical lordosis could be positional, due to muscular spasm, or ligamentous injury. 5. Sinus disease   Electronically Signed   By: Jeronimo Greaves M.D.   On: 01/09/2015 15:03   Ct Cervical Spine Wo Contrast  01/09/2015   CLINICAL DATA:  Initial encounter for cardiac arrest.  EXAM: CT HEAD WITHOUT CONTRAST  CT CERVICAL SPINE WITHOUT CONTRAST  TECHNIQUE: Multidetector CT imaging of the head and cervical spine was performed following the standard protocol without intravenous contrast. Multiplanar CT image reconstructions of the cervical spine were also generated.  COMPARISON:  None.  FINDINGS: CT HEAD FINDINGS  Sinuses/Soft tissues: Minimal mucosal thickening of ethmoid air cells and left sphenoid sinus. Clear mastoid air cells.  Intracranial: No mass lesion, hemorrhage, hydrocephalus, acute infarct, intra-axial, or extra-axial fluid collection.  CT CERVICAL SPINE FINDINGS  Spinal visualization through the bottom of T2. Prevertebral soft tissues not well evaluated secondary to endotracheal and nasogastric tubes. Mild loss of vertebral body height at C4 through C6. Likely within normal variation/chronic. Secretions in the nasopharynx and oral pharynx are also likely related to endotracheal tube in place. No apical pneumothorax. Biapical centrilobular and paraseptal emphysema.  Advanced cervical spondylosis. This results in areas of the central canal stenosis at C4-5 through C6-7. Bilateral neural foraminal narrowing at C5-6 and C6-7.  Straightening of expected lordosis.  Skull base intact. Facets are well-aligned. Coronal reformats demonstrate only degenerative irregularity about the C1-2 articulation.  IMPRESSION: 1.  No acute intracranial abnormality. 2. Advanced cervical spondylosis, without acute finding. 3. Degraded evaluation for prevertebral soft tissue swelling, secondary to endotracheal tube in place. 4. Straightening of expected cervical lordosis could be positional, due to muscular spasm, or ligamentous injury. 5. Sinus disease   Electronically Signed   By: Jeronimo Greaves M.D.   On: 01/09/2015 15:03   Dg Chest Port 1 View  01/12/2015   CLINICAL DATA:  Shortness of breath and respiratory failure  EXAM: PORTABLE CHEST - 1 VIEW  COMPARISON:  01/11/2015  FINDINGS: Cardiac shadow is stable. The endotracheal tube and nasogastric catheter have been removed in the interval. A right jugular line is again seen and stable. Bibasilar atelectatic changes are noted slightly worse on the right than the left.  IMPRESSION: New bibasilar atelectatic changes   Electronically Signed   By: Alcide Clever M.D.   On: 01/12/2015 08:09   Dg Chest Port 1 View  01/11/2015   CLINICAL DATA:  Check endotracheal tube placement  EXAM: PORTABLE CHEST - 1 VIEW  COMPARISON:  01/10/2015  FINDINGS: The endotracheal tube is noted 6 cm above the carina and stable. A right jugular line is seen just above the cavoatrial junction. A nasogastric catheter courses towards the stomach. The cardiac shadow is stable. The lungs are well aerated bilaterally. No acute bony abnormality is seen.  IMPRESSION: Tubes and lines stable in appearance.  No acute abnormality is seen.   Electronically Signed   By: Alcide Clever M.D.   On: 01/11/2015 07:24   Dg Chest Port 1 View  01/10/2015   CLINICAL DATA:  Hypoxia  EXAM: PORTABLE CHEST - 1 VIEW  COMPARISON:  January 09, 2015  FINDINGS: Endotracheal tube tip is 5.1 cm above the carina. Central catheter tip is in the superior vena cava.  Nasogastric tube tip and side port are below the diaphragm. No pneumothorax. There is no edema or consolidation. Heart size and pulmonary vascularity are within normal limits. No adenopathy. No bone lesions.  IMPRESSION: Tube and catheter positions as described without pneumothorax. No edema or consolidation.   Electronically Signed   By: Bretta Bang M.D.   On: 01/10/2015 07:17   Dg Chest Port 1 View  01/09/2015   CLINICAL DATA:  PICC/central line placement.  Shortness of breath.  EXAM: PORTABLE CHEST - 1 VIEW  COMPARISON:  01/09/2015 and 07/20/2009.  FINDINGS: Endotracheal tube with tip 7.6 cm above the carina. Enteric tube courses into the region of the stomach and off the film as tip is not visualized. Right IJ central venous catheter has tip over the SVC. External defibrillator over the left chest. Lungs are adequately inflated without consolidation or effusion. Minimal stable prominence of the central vascular markings. Cardiomediastinal silhouette and remainder the exam is unchanged. There is no evidence of right-sided pneumothorax.  IMPRESSION: Suggestion mild vascular congestion.  Tubes and lines as described.   Electronically Signed   By: Elberta Fortis M.D.   On: 01/09/2015 15:44   Dg Chest Portable 1 View  01/09/2015   CLINICAL DATA:  CPR  EXAM: PORTABLE CHEST - 1 VIEW  COMPARISON:  07/20/2009  FINDINGS: Normal heart size. Endotracheal tube is 6.2 cm from the carina. NG tube is in the fundus of the stomach. Clear lungs. No pneumothorax. No pleural effusion. Normal vascularity.  IMPRESSION: Endotracheal and NG tubes as described.  Clear lungs.   Electronically Signed   By: Maryclare Bean M.D.   On: 01/09/2015 14:43    PHYSICAL EXAM General: NAD. Frail and weak appearing Neck: RIJ TLC. No JVD, no thyromegaly or thyroid nodule.  Lungs: Coarse breath sounds CV: Nondisplaced breath sounds.PMI.  RRR  No peripheral edema.  No carotid bruit.  Normal pedal pulses.  Abdomen: Soft, no  hepatosplenomegaly, no distention.  Neurologic: Awake/alert Extremities: No clubbing or cyanosis.   TELEMETRY: NSR 80-90s  ASSESSMENT AND PLAN: 69 yo homeless man with history of CAD s/p inferior MI and PTCA in 11/15 as well as cocaine abuse presented with ventricular fibrillation arrest.  1. Vfib arrest: Patient collapsed on street, was down about 20 minutes prior to defibrillation and ROSC.  Cocaine negative in urine.  ECG unchanged (old inferoposterior MI), troponin minimally elevated.  Likely scar-related vfib, think less likely new ACS.  - Will plan cath today. Ideally would need ICD for secondary prevention but social situation - I do not believe he is an appropriate candidate as he is unable to f/u. Marland Kitchen No recurrent VT/VF on monitor.  - Change amiodarone to 200 bid.  2. CAD: TnI minimally elevated, ECG  stable with old inferoposterior MI.  As above, may be scar-related vfib rather than new ACS.   - Continue ASA/statin.  - Cath today 3. Chronic systolic CHF: Ischemic cardiomyopathy.  Echo with stable EF 25-30%.  CVP 5-6.  - Co-ox ok today - Continue Coreg/enalapril. Will stop digoxin due to compliance issues and need for amio.  4. Atrial fibrillation: With RVR.   - Back in NSR on amio. Changing to po - Heparin gtt for now.  Not candidate for long-term anticoagulation. 5. ID: PCN and WBC elevated.  - per CCM  Stable from cardiac perspective. Change amio to po. For cath today. Not ICD or coumadin candidate.   Arvilla Meres MD 01/13/2015 10:44 AM

## 2015-01-13 NOTE — Progress Notes (Signed)
PULMONARY / CRITICAL CARE MEDICINE   Name: Skip EstimableJames E Ballman MRN: [id number] DOB: Mar 21, 1946    ADMISSION DATE:  01/09/2015 CONSULTATION DATE:  01/09/2015  REFERRING MD :  EDP  CHIEF COMPLAINT:  Cardiac arrest  INITIAL PRESENTATION: 69 year old male with history of recent VF arrest in 10/2014 2nd to RCA lesion. Presents again to ED 1/18 with VF arrest. ROSC after 8 mins ACLS. Total downtime ~20 mins. PCCM to admit.   STUDIES:   SIGNIFICANT EVENTS: 11/5 - 11/14 > admission for VF arrest, underwent hypothermia and PCI to RCA 1/18 CVL left IF >>> 1/18 A-Line left Radial >>> 1/18 EEG normal 1/19 Repeat ECHO 25-30% with inferior wall hypokinesis. Unchanged from 10/2014 1/20 Self extubated 1/20 developed Afib with RVR >> started amio, coreg, diltiazem and digoxin   SUBJECTIVE:  Stable overnight Having increased cough with purulent blood-tinged secretions.    VITAL SIGNS: Temp:  [97.9 F (36.6 C)-99.6 F (37.6 C)] 99.3 F (37.4 C) (01/22 1108) Pulse Rate:  [88-128] 88 (01/22 1108) Resp:  [13-29] 21 (01/22 1108) BP: (72-131)/(54-77) 108/76 mmHg (01/22 1108) SpO2:  [95 %-100 %] 99 % (01/22 1108) Arterial Line BP: (72-108)/(50-68) 89/60 mmHg (01/21 1700) HEMODYNAMICS: CVP:  [1 mmHg-4 mmHg] 1 mmHg VENTILATOR SETTINGS:   INTAKE / OUTPUT:  Intake/Output Summary (Last 24 hours) at 01/13/15 1255 Last data filed at 01/13/15 1100  Gross per 24 hour  Intake 1692.23 ml  Output   2325 ml  Net -632.77 ml    PHYSICAL EXAMINATION: General:  Thin black male, Not in acute distress. Coughing Neuro:  Awake, follows commands, responds to questions.  HEENT:  [place]/AT, No JVD noted. Cardiovascular:  Tachy, irreg irreg. No MRG noted Lungs:  Course at bases Abdomen:  Soft, non-distended Musculoskeletal:  No edema. Moves extremities. No acute deformity Skin:  Skin is dry, flaky lower extremities.   LABS:  CBC  Recent Labs Lab 01/12/15 0440 01/12/15 0835 01/13/15 0525  WBC 24.0* 24.3*  15.4*  HGB 12.6* 12.4* 11.2*  HCT 38.8* 38.4* 33.6*  PLT 374 353 306   Coag's  Recent Labs Lab 01/09/15 1411 01/09/15 2154  APTT 35 43*  INR 1.16 1.08   BMET  Recent Labs Lab 01/11/15 0600 01/12/15 0440 01/13/15 0525  NA 145 145 139  K 4.2 4.3 3.6  CL 110 111 107  CO2 22 24 25   BUN 16 17 17   CREATININE 1.00 1.07 1.19  GLUCOSE 100* 77 107*   Electrolytes  Recent Labs Lab 01/10/15 0427  01/11/15 0600 01/12/15 0440 01/13/15 0525  CALCIUM  --   < > 9.1 8.9 8.6  MG 1.9  --  1.7 2.2  --   PHOS 2.6  --  5.5* 3.6  --   < > = values in this interval not displayed. Sepsis Markers  Recent Labs Lab 01/09/15 1422 01/10/15 0530 01/12/15 0440 01/12/15 0823 01/13/15 0525  LATICACIDVEN 7.78* 1.3  --  2.7*  --   PROCALCITON  --   --  14.16  --  17.56   ABG  Recent Labs Lab 01/09/15 1514 01/10/15 0359  PHART 7.390 7.473*  PCO2ART 38.0 33.2*  PO2ART 435.0* 424.0*   Liver Enzymes  Recent Labs Lab 01/12/15 0440  AST 43*  ALT 29  ALKPHOS 98  BILITOT 0.6  ALBUMIN 2.5*   Cardiac Enzymes  Recent Labs Lab 01/09/15 1420 01/10/15 0227 01/10/15 0825  TROPONINI <0.03 0.19* 0.13*   Glucose  Recent Labs Lab 01/10/15 1709 01/10/15 2039 01/11/15  0039 01/11/15 0430 01/11/15 0725 01/11/15 1142  GLUCAP 95 91 91 91 89 90    Imaging Dg Chest Port 1 View  01/12/2015   CLINICAL DATA:  Shortness of breath and respiratory failure  EXAM: PORTABLE CHEST - 1 VIEW  COMPARISON:  01/11/2015  FINDINGS: Cardiac shadow is stable. The endotracheal tube and nasogastric catheter have been removed in the interval. A right jugular line is again seen and stable. Bibasilar atelectatic changes are noted slightly worse on the right than the left.  IMPRESSION: New bibasilar atelectatic changes   Electronically Signed   By: Alcide Clever M.D.   On: 01/12/2015 08:09     ASSESSMENT / PLAN:  PULMONARY OETT 1/18>>>1/20 self extubated A:  Acute hypoxemic respiratory failure,  resolved Suspected RLL PNA P:   Wean FiO2 Will start abx for possible HCAP 1/22  CARDIOVASCULAR CVL 1/18 >>> A-line 1/18 >> A:  VF arrest Ischemic cardiomyopathy - LVEF 25% CAD Hypertensive Emergency >improved. Repeat ECHO 25-30% with inferior wall hypokinesis. Unchanged from 10/2014 Low CVP 2 Atrial fibrillation with RVR P:  Cont with amiodarone, coreg; digoxin to be stopped 1/22 Cont with heparin gtt, will be a poor candidate for long term oral anticoagulants.  MAP goal > 65 mm/Hg SBP goal < 160 PRN labetalol to achieve SBP goal L heart cath planned for 1/22 ? Whether he would be a candidate for AICD - there will be social barriers to follow up  RENAL A:   Metabolic acidosis, lactic Hypokalemia > resolved  Low urine output P:   Monitor urine output closely  Monitor BMET post contrast for cath  GASTROINTESTINAL A:   No acute issues, passed swallow eval 1/21 P:   Heart healthy diet after cath  HEMATOLOGIC A:   Anemia, chronic Leukocytosis.  Possible fevers with temp of 99.1 while being cooled   P:  Follow CBC Heparin for DVT ppx  INFECTIOUS A:   Leukocytosis and evolving RLL infiltrate, purulent secretions, suspect HCAP vs bronchitis P:   Blood and urine culture 1/21>>  Start vanco + cefepime on 1/22, follow cx data and clinical response  ENDOCRINE A:   No acute issues   P:   CBG monitoring and SSI  NEUROLOGIC A:   Acute encephalopathy > improving Cocaine abuse. UDS positive for cocaine  P:   Full awake this am   Family: Updated son Linward Foster 1/20. No family this morning.    Levy Pupa, MD, PhD 01/13/2015, 12:55 PM [facility] [phone] or if no answer [phone]

## 2015-01-14 ENCOUNTER — Inpatient Hospital Stay (HOSPITAL_COMMUNITY): Payer: Medicare Other

## 2015-01-14 DIAGNOSIS — I2510 Atherosclerotic heart disease of native coronary artery without angina pectoris: Secondary | ICD-10-CM

## 2015-01-14 DIAGNOSIS — I5021 Acute systolic (congestive) heart failure: Secondary | ICD-10-CM

## 2015-01-14 LAB — CBC
HEMATOCRIT: 31.1 % — AB (ref 39.0–52.0)
Hemoglobin: 10.2 g/dL — ABNORMAL LOW (ref 13.0–17.0)
MCH: 27.2 pg (ref 26.0–34.0)
MCHC: 32.8 g/dL (ref 30.0–36.0)
MCV: 82.9 fL (ref 78.0–100.0)
PLATELETS: 297 10*3/uL (ref 150–400)
RBC: 3.75 MIL/uL — AB (ref 4.22–5.81)
RDW: 14.8 % (ref 11.5–15.5)
WBC: 11.8 10*3/uL — AB (ref 4.0–10.5)

## 2015-01-14 LAB — BASIC METABOLIC PANEL
Anion gap: 10 (ref 5–15)
BUN: 14 mg/dL (ref 6–23)
CHLORIDE: 107 mmol/L (ref 96–112)
CO2: 23 mmol/L (ref 19–32)
CREATININE: 1.02 mg/dL (ref 0.50–1.35)
Calcium: 8.9 mg/dL (ref 8.4–10.5)
GFR calc Af Amer: 86 mL/min — ABNORMAL LOW (ref >90)
GFR, EST NON AFRICAN AMERICAN: 74 mL/min — AB (ref >90)
GLUCOSE: 107 mg/dL — AB (ref 70–99)
Potassium: 3.5 mmol/L (ref 3.5–5.1)
Sodium: 140 mmol/L (ref 135–145)

## 2015-01-14 LAB — PROCALCITONIN: Procalcitonin: 7.68 ng/mL

## 2015-01-14 LAB — CARBOXYHEMOGLOBIN
Carboxyhemoglobin: 1.2 % (ref 0.5–1.5)
Methemoglobin: 1.1 % (ref 0.0–1.5)
O2 SAT: 71.2 %
TOTAL HEMOGLOBIN: 10.6 g/dL — AB (ref 13.5–18.0)

## 2015-01-14 MED ORDER — SODIUM CHLORIDE 0.9 % IV SOLN
INTRAVENOUS | Status: DC
  Administered 2015-01-14: 05:00:00 via INTRAVENOUS

## 2015-01-14 NOTE — Progress Notes (Signed)
Patient ID: [name], male   DOB: Apr 01, 1946, 69 y.o.   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 Went into atrial fibrillation with RVR on 1/21 now in NSR on amio.  Doing well sitting up in bed No chest pain    Echo 01/09/15 EF 25-30%  (was 20-25% in 11/15)  Scheduled Meds: . amiodarone  200 mg Oral BID  . aspirin  81 mg Oral Daily  . atorvastatin  80 mg Oral q1800  . carvedilol  6.25 mg Oral BID WC  . ceFEPime (MAXIPIME) IV  1 g Intravenous 3 times per day  . enoxaparin (LOVENOX) injection  40 mg Subcutaneous Q24H  . vancomycin  750 mg Intravenous Q12H   Continuous Infusions: . sodium chloride 10 mL/hr at 01/14/15 0800   PRN Meds:.acetaminophen, fentaNYL, ondansetron (ZOFRAN) IV    Filed Vitals:   01/14/15 0748 01/14/15 0800 01/14/15 1156 01/14/15 1200  BP: 120/86  111/73   Pulse: 71 78 74 69  Temp: 98.9 F (37.2 C)  97.7 F (36.5 C)   TempSrc: Oral  Oral   Resp: Height:      Weight:      SpO2: 98% 95% 95% 94%    Intake/Output Summary (Last 24 hours) at 01/14/15 1246 Last data filed at 01/14/15 1200  Gross per 24 hour  Intake 1198.75 ml  Output   1350 ml  Net -151.25 ml    LABS: Basic Metabolic Panel:  Recent Labs  40/98/11 0440 01/13/15 0525 01/14/15 0405  NA 145 139 140  K 4.3 3.6 3.5  CL 111 107 107  CO2 GLUCOSE 77 107* 107*  BUN CREATININE 1.07 1.19 1.02  CALCIUM 8.9 8.6 8.9  MG 2.2  --   --   PHOS 3.6  --   --    Liver Function Tests:  Recent Labs  01/12/15 0440  AST 43*  ALT 29  ALKPHOS 98  BILITOT 0.6  PROT 7.0  ALBUMIN 2.5*   CBC:  Recent Labs  01/13/15 0525 01/14/15 0405  WBC 15.4* 11.8*  HGB 11.2* 10.2*  HCT 33.6* 31.1*  MCV 85.1 82.9  PLT 306 297     RADIOLOGY: Ct Head Wo Contrast  01/09/2015   CLINICAL DATA:  Initial encounter for cardiac arrest.  EXAM: CT HEAD WITHOUT CONTRAST  CT CERVICAL SPINE WITHOUT CONTRAST  TECHNIQUE: Multidetector CT imaging of the head and cervical spine was performed following the standard protocol without intravenous contrast. Multiplanar CT image reconstructions of the cervical spine were also generated.  COMPARISON:  None.  FINDINGS: CT HEAD FINDINGS  Sinuses/Soft tissues: Minimal mucosal thickening of ethmoid air cells and left sphenoid sinus. Clear mastoid air cells.  Intracranial: No mass lesion, hemorrhage, hydrocephalus, acute infarct, intra-axial, or extra-axial fluid collection.  CT CERVICAL SPINE FINDINGS  Spinal visualization through the bottom of T2. Prevertebral soft tissues not well evaluated secondary to endotracheal and nasogastric tubes. Mild loss of vertebral body height at C4 through C6. Likely within normal variation/chronic. Secretions in the nasopharynx and oral pharynx are also likely related to endotracheal tube in place. No apical pneumothorax. Biapical centrilobular and paraseptal emphysema.  Advanced cervical spondylosis. This results in areas of the central canal stenosis  at C4-5 through C6-7. Bilateral neural foraminal narrowing at C5-6 and C6-7. Straightening of expected lordosis.  Skull base intact. Facets are well-aligned. Coronal reformats demonstrate only degenerative irregularity about the C1-2 articulation.  IMPRESSION: 1.  No acute intracranial abnormality. 2. Advanced cervical spondylosis, without acute finding. 3. Degraded evaluation for prevertebral soft tissue swelling, secondary to endotracheal tube in place. 4. Straightening of expected cervical lordosis could be positional, due to muscular spasm, or ligamentous injury. 5. Sinus disease   Electronically Signed   By: Jeronimo Greaves M.D.   On: 01/09/2015 15:03   Ct Cervical Spine Wo Contrast  01/09/2015   CLINICAL DATA:  Initial  encounter for cardiac arrest.  EXAM: CT HEAD WITHOUT CONTRAST  CT CERVICAL SPINE WITHOUT CONTRAST  TECHNIQUE: Multidetector CT imaging of the head and cervical spine was performed following the standard protocol without intravenous contrast. Multiplanar CT image reconstructions of the cervical spine were also generated.  COMPARISON:  None.  FINDINGS: CT HEAD FINDINGS  Sinuses/Soft tissues: Minimal mucosal thickening of ethmoid air cells and left sphenoid sinus. Clear mastoid air cells.  Intracranial: No mass lesion, hemorrhage, hydrocephalus, acute infarct, intra-axial, or extra-axial fluid collection.  CT CERVICAL SPINE FINDINGS  Spinal visualization through the bottom of T2. Prevertebral soft tissues not well evaluated secondary to endotracheal and nasogastric tubes. Mild loss of vertebral body height at C4 through C6. Likely within normal variation/chronic. Secretions in the nasopharynx and oral pharynx are also likely related to endotracheal tube in place. No apical pneumothorax. Biapical centrilobular and paraseptal emphysema.  Advanced cervical spondylosis. This results in areas of the central canal stenosis at C4-5 through C6-7. Bilateral neural foraminal narrowing at C5-6 and C6-7. Straightening of expected lordosis.  Skull base intact. Facets are well-aligned. Coronal reformats demonstrate only degenerative irregularity about the C1-2 articulation.  IMPRESSION: 1.  No acute intracranial abnormality. 2. Advanced cervical spondylosis, without acute finding. 3. Degraded evaluation for prevertebral soft tissue swelling, secondary to endotracheal tube in place. 4. Straightening of expected cervical lordosis could be positional, due to muscular spasm, or ligamentous injury. 5. Sinus disease   Electronically Signed   By: Jeronimo Greaves M.D.   On: 01/09/2015 15:03   Dg Chest Port 1 View  01/14/2015   CLINICAL DATA:  Possible acquired pneumonia  EXAM: PORTABLE CHEST - 1 VIEW  COMPARISON:  01/12/2015  FINDINGS:  Cardiac shadow remains enlarged. Bibasilar infiltrative changes are again noted. AV right jugular central line is again seen and stable. No acute abnormality is noted.  IMPRESSION: Stable bibasilar changes   Electronically Signed   By: Alcide Clever M.D.   On: 01/14/2015 07:16   Dg Chest Port 1 View  01/12/2015   CLINICAL DATA:  Shortness of breath and respiratory failure  EXAM: PORTABLE CHEST - 1 VIEW  COMPARISON:  01/11/2015  FINDINGS: Cardiac shadow is stable. The endotracheal tube and nasogastric catheter have been removed in the interval. A right jugular line is again seen and stable. Bibasilar atelectatic changes are noted slightly worse on the right than the left.  IMPRESSION: New bibasilar atelectatic changes   Electronically Signed   By: Alcide Clever M.D.   On: 01/12/2015 08:09   Dg Chest Port 1 View  01/11/2015   CLINICAL DATA:  Check endotracheal tube placement  EXAM: PORTABLE CHEST - 1 VIEW  COMPARISON:  01/10/2015  FINDINGS: The endotracheal tube is noted 6 cm above the carina and stable. A right jugular line is seen just above the cavoatrial junction. A nasogastric  catheter courses towards the stomach. The cardiac shadow is stable. The lungs are well aerated bilaterally. No acute bony abnormality is seen.  IMPRESSION: Tubes and lines stable in appearance.  No acute abnormality is seen.   Electronically Signed   By: Alcide Clever M.D.   On: 01/11/2015 07:24   Dg Chest Port 1 View  01/10/2015   CLINICAL DATA:  Hypoxia  EXAM: PORTABLE CHEST - 1 VIEW  COMPARISON:  January 09, 2015  FINDINGS: Endotracheal tube tip is 5.1 cm above the carina. Central catheter tip is in the superior vena cava. Nasogastric tube tip and side port are below the diaphragm. No pneumothorax. There is no edema or consolidation. Heart size and pulmonary vascularity are within normal limits. No adenopathy. No bone lesions.  IMPRESSION: Tube and catheter positions as described without pneumothorax. No edema or consolidation.    Electronically Signed   By: Bretta Bang M.D.   On: 01/10/2015 07:17   Dg Chest Port 1 View  01/09/2015   CLINICAL DATA:  PICC/central line placement.  Shortness of breath.  EXAM: PORTABLE CHEST - 1 VIEW  COMPARISON:  01/09/2015 and 07/20/2009.  FINDINGS: Endotracheal tube with tip 7.6 cm above the carina. Enteric tube courses into the region of the stomach and off the film as tip is not visualized. Right IJ central venous catheter has tip over the SVC. External defibrillator over the left chest. Lungs are adequately inflated without consolidation or effusion. Minimal stable prominence of the central vascular markings. Cardiomediastinal silhouette and remainder the exam is unchanged. There is no evidence of right-sided pneumothorax.  IMPRESSION: Suggestion mild vascular congestion.  Tubes and lines as described.   Electronically Signed   By: Elberta Fortis M.D.   On: 01/09/2015 15:44   Dg Chest Portable 1 View  01/09/2015   CLINICAL DATA:  CPR  EXAM: PORTABLE CHEST - 1 VIEW  COMPARISON:  07/20/2009  FINDINGS: Normal heart size. Endotracheal tube is 6.2 cm from the carina. NG tube is in the fundus of the stomach. Clear lungs. No pneumothorax. No pleural effusion. Normal vascularity.  IMPRESSION: Endotracheal and NG tubes as described.  Clear lungs.   Electronically Signed   By: Maryclare Bean M.D.   On: 01/09/2015 14:43    PHYSICAL EXAM General: NAD. Frail and weak appearing black male  Neck: RIJ TLC. No JVD, no thyromegaly or thyroid nodule.  Lungs: Coarse breath sounds CV: Nondisplaced breath sounds.PMI.  RRR  No peripheral edema.  No carotid bruit.  Normal pedal pulses.  Abdomen: Soft, no hepatosplenomegaly, no distention.  Neurologic: Awake/alert Extremities: No clubbing or cyanosis.   TELEMETRY: NSR 80-90s  ASSESSMENT AND PLAN: 69 yo homeless man with history of CAD s/p inferior MI and PTCA in 11/15 as well as cocaine abuse presented with ventricular fibrillation arrest.  1. Vfib arrest:  Patient collapsed on street, was down about 20 minutes prior to defibrillation and ROSC.  Cocaine negative in urine.  ECG unchanged (old inferoposterior MI), troponin minimally elevated. Cath with No new lesions and inferior non viable aneurysm substrate for VT  - Change amiodarone to 200 bid. Will help with PAF as well  2. Chronic systolic CHF: Ischemic cardiomyopathy.  Echo with stable EF 25-30%.  CVP 5-6.  - Continue Coreg/enalapril. Digoxen stopped  due to compliance issues and need for amio.  3. Atrial fibrillation: With RVR.   - Back in NSR on amio. - Heparin gtt for now.  Not candidate for long-term anticoagulation. 4. ID: PCN and  WBC elevated.  - per CCM  Will ask EP to see   Charlton HawsPeter Nishan MD 01/14/2015 12:46 PM

## 2015-01-14 NOTE — Progress Notes (Signed)
PULMONARY / CRITICAL CARE MEDICINE   Name: [name] MRN: [id number] DOB: 1946/06/01    ADMISSION DATE:  01/09/2015 CONSULTATION DATE:  01/09/2015  REFERRING MD :  EDP  CHIEF COMPLAINT:  Cardiac arrest  INITIAL PRESENTATION: 69 year old male with history of recent VF arrest in 10/2014 2nd to RCA lesion. Presents again to ED 1/18 with VF arrest. ROSC after 8 mins ACLS. Total downtime ~20 mins. PCCM to admit.   STUDIES:   SIGNIFICANT EVENTS: 11/5 - 11/14 > admission for VF arrest, underwent hypothermia and PCI to RCA 1/18 CVL left IF >>> 1/18 A-Line left Radial >>> 1/18 EEG normal 1/19 Repeat ECHO 25-30% with inferior wall hypokinesis. Unchanged from 10/2014 1/20 Self extubated 1/20 developed Afib with RVR >> started amio, coreg, diltiazem and digoxin 1/22 LHC > high grade RCA disease, large inferobasilar aneursysm LV, widely patent left coronary artery system, LVEF 25%   SUBJECTIVE:  No acute events Complains of nausea while eating breakfast  VITAL SIGNS: Temp:  [98.4 F (36.9 C)-99.5 F (37.5 C)] 98.9 F (37.2 C) (01/23 0748) Pulse Rate:  [71-101] 78 (01/23 0800) Resp:  [15-27] 26 (01/23 0800) BP: (92-134)/(46-86) 120/86 mmHg (01/23 0748) SpO2:  [93 %-100 %] 95 % (01/23 0800) Weight:  [68.2 kg (150 lb 5.7 oz)] 68.2 kg (150 lb 5.7 oz) (01/22 1400) HEMODYNAMICS:   VENTILATOR SETTINGS:   INTAKE / OUTPUT:  Intake/Output Summary (Last 24 hours) at 01/14/15 0856 Last data filed at 01/14/15 0800  Gross per 24 hour  Intake 1288.65 ml  Output   1700 ml  Net -411.35 ml    PHYSICAL EXAMINATION: General:  No acute distress HEENT: NCAT EOMi PULM: Few crackles L base CV: RRR, s1/s2 AB: BS+, soft Ext: warm no edema Neuro: A&Ox4, maew  LABS:  CBC  Recent Labs Lab 01/12/15 0835 01/13/15 0525 01/14/15 0405  WBC 24.3* 15.4* 11.8*  HGB 12.4* 11.2* 10.2*  HCT 38.4* 33.6* 31.1*  PLT 353 306 297   Coag's  Recent Labs Lab 01/09/15 1411 01/09/15 2154   APTT 35 43*  INR 1.16 1.08   BMET  Recent Labs Lab 01/12/15 0440 01/13/15 0525 01/14/15 0405  NA 145 139 140  K 4.3 3.6 3.5  CL 111 107 107  CO2 24 25 23   BUN 17 17 14   CREATININE 1.07 1.19 1.02  GLUCOSE 77 107* 107*   Electrolytes  Recent Labs Lab 01/10/15 0427  01/11/15 0600 01/12/15 0440 01/13/15 0525 01/14/15 0405  CALCIUM  --   < > 9.1 8.9 8.6 8.9  MG 1.9  --  1.7 2.2  --   --   PHOS 2.6  --  5.5* 3.6  --   --   < > = values in this interval not displayed. Sepsis Markers  Recent Labs Lab 01/09/15 1422 01/10/15 0530 01/12/15 0440 01/12/15 0823 01/13/15 0525 01/14/15 0405  LATICACIDVEN 7.78* 1.3  --  2.7*  --   --   PROCALCITON  --   --  14.16  --  17.56 7.68   ABG  Recent Labs Lab 01/09/15 1514 01/10/15 0359  PHART 7.390 7.473*  PCO2ART 38.0 33.2*  PO2ART 435.0* 424.0*   Liver Enzymes  Recent Labs Lab 01/12/15 0440  AST 43*  ALT 29  ALKPHOS 98  BILITOT 0.6  ALBUMIN 2.5*   Cardiac Enzymes  Recent Labs Lab 01/09/15 1420 01/10/15 0227 01/10/15 0825  TROPONINI <0.03 0.19* 0.13*   Glucose  Recent Labs Lab 01/10/15 1709 01/10/15 2039  01/11/15 0039 01/11/15 0430 01/11/15 0725 01/11/15 1142  GLUCAP 95 91 91 91 89 90    Imaging No results found. 1/23 CXR > bibasilar atelectasis vs infiltrate R> L  ASSESSMENT / PLAN:  PULMONARY OETT 1/18>>>1/20 self extubated A:  Acute hypoxemic respiratory failure, resolved Suspected RLL HCAP P:   Wean FiO2 See ID pulm toilette  CARDIOVASCULAR CVL 1/18 >>> A-line 1/18 >> A:  VF arrest Ischemic cardiomyopathy - LVEF 25% CAD > LHC without significant change in coronary disease since 10/2014 Hypertensive Emergency >improved. Repeat ECHO 25-30% with inferior wall hypokinesis. Unchanged from 10/2014 Low CVP 2 Atrial fibrillation with RVR P:  ? ICD Per cardiology  RENAL A:   Metabolic acidosis, lactic Hypokalemia > resolved  Low urine output P:   Monitor urine output  closely  Monitor BMET   GASTROINTESTINAL A:   No acute issues, passed swallow eval 1/21 P:   Heart healthy diet   HEMATOLOGIC A:   Anemia, chronic  P:  Follow CBC Heparin for DVT ppx  INFECTIOUS A:   HCAP, stable P:   Blood and urine culture 1/21>>  vanco + cefepime 1/22, > narrow after following cultures  ENDOCRINE A:   No acute issues   P:   CBG monitoring and SSI  NEUROLOGIC A:   Acute encephalopathy > improving Cocaine abuse. UDS positive for cocaine  P:   Awake and alert  Family: Updated son Linward Foster 1/20. No family this morning.   Transfer to [facility] service, PCCM off  Heber Bliss [place], MD [place] PCCM Pager: [phone] Cell: [phone] If no response, call [phone]

## 2015-01-15 LAB — URINE CULTURE: Colony Count: 95000

## 2015-01-15 LAB — CBC
HCT: 31.7 % — ABNORMAL LOW (ref 39.0–52.0)
Hemoglobin: 10.5 g/dL — ABNORMAL LOW (ref 13.0–17.0)
MCH: 27.3 pg (ref 26.0–34.0)
MCHC: 33.1 g/dL (ref 30.0–36.0)
MCV: 82.6 fL (ref 78.0–100.0)
Platelets: 311 10*3/uL (ref 150–400)
RBC: 3.84 MIL/uL — ABNORMAL LOW (ref 4.22–5.81)
RDW: 14.8 % (ref 11.5–15.5)
WBC: 9.5 10*3/uL (ref 4.0–10.5)

## 2015-01-15 LAB — CARBOXYHEMOGLOBIN
Carboxyhemoglobin: 1.3 % (ref 0.5–1.5)
METHEMOGLOBIN: 0.8 % (ref 0.0–1.5)
O2 SAT: 79.7 %
TOTAL HEMOGLOBIN: 10 g/dL — AB (ref 13.5–18.0)

## 2015-01-15 LAB — BASIC METABOLIC PANEL
ANION GAP: 8 (ref 5–15)
BUN: 14 mg/dL (ref 6–23)
CO2: 27 mmol/L (ref 19–32)
Calcium: 8.8 mg/dL (ref 8.4–10.5)
Chloride: 105 mmol/L (ref 96–112)
Creatinine, Ser: 1 mg/dL (ref 0.50–1.35)
GFR calc non Af Amer: 76 mL/min — ABNORMAL LOW (ref >90)
GFR, EST AFRICAN AMERICAN: 88 mL/min — AB (ref >90)
Glucose, Bld: 120 mg/dL — ABNORMAL HIGH (ref 70–99)
Potassium: 3.3 mmol/L — ABNORMAL LOW (ref 3.5–5.1)
SODIUM: 140 mmol/L (ref 135–145)

## 2015-01-15 MED ORDER — POTASSIUM CHLORIDE CRYS ER 20 MEQ PO TBCR
30.0000 meq | EXTENDED_RELEASE_TABLET | ORAL | Status: DC
  Administered 2015-01-15: 30 meq via ORAL
  Filled 2015-01-15 (×2): qty 1

## 2015-01-15 MED ORDER — POTASSIUM CHLORIDE CRYS ER 20 MEQ PO TBCR
40.0000 meq | EXTENDED_RELEASE_TABLET | Freq: Once | ORAL | Status: AC
  Administered 2015-01-15: 40 meq via ORAL
  Filled 2015-01-15: qty 2

## 2015-01-15 NOTE — Progress Notes (Signed)
TEAM 1 - Stepdown/ICU TEAM Progress Note  LAMONE FERRELLI UEA:[id number] DOB: 05/31/1946 DOA: 01/09/2015 PCP: No PCP Per Patient  Admit HPI / Brief Narrative: 69 year old male with a hx of VF arrest 2nd to MI in 10/2014 (taken to cath lab w/ PCI to occluded RCA - intubated on hypothermia protocol for several days w/ resultant encephalopathy and cardiomyopathy). On 1/18 he presented to [facility] after a witnessed arrest. 15 mins downtime prior to EMS arrival. ACLS was started. Initial rythym was VF. ROSC was achieved after 10 minutes ACLS with defibrillation x 4. Total downtime ~20 mins. He was intubated in ED.  Significant events: 11/5 - 11/14 > admission for VF arrest, underwent hypothermia and PCI to RCA 1/18 CVL left IF > 1/18 A-Line left Radial > 1/18 EEG normal 1/19 Repeat ECHO 25-30% with inferior wall hypokinesis. Unchanged from 10/2014 1/20 Self extubated 1/20 developed Afib with RVR > started amio, coreg, diltiazem and digoxin 1/22 LHC > high grade RCA disease, large inferobasilar aneursysm LV, widely patent left coronary artery system, LVEF 25%  HPI/Subjective: Pt's only complaint this morning is "not having a taste" for food.  He denies cp, sob, n/v, or abdom pain.  He states he is beginning to expectorate tan and yellow blood tinged phlegm.    Assessment/Plan:  Acute hypoxemic respiratory failure due to ACS - resolved  Suspected RLL HCAP Cont empiric abx tx  VF arrest Per Cardiology - EP to see  Ischemic cardiomyopathy / chronic systolic CHF - LVEF 25% Clinically appears well compensated at present   CAD LHC without significant change in coronary disease since 10/2014  Hypertensive Emergency Resolved - BP now well controlled   Mild hypokalemia  Replace and check Mg   Atrial fibrillation with RVR Not a candidate for long term anticoag due to compliance and substance abuse - NSR at present   Lactic acidosis  Due to arrest - resolved   Chronic  anemia Follow  Cocaine abuse  UDS negative this admit   Homelessness  Code Status: FULL Family Communication: no family present at time of exam Disposition Plan: SDU  Consultants: [facility] Cards EP   Antibiotics: Cefepime 1/22 > Vanc 1/22 >  DVT prophylaxis: lovenox  Objective: Blood pressure 126/71, pulse 78, temperature 99.5 F (37.5 C), temperature source Oral, resp. rate 24, height  (1.88 m), weight 68.2 kg (150 lb 5.7 oz), SpO2 97 %.  Intake/Output Summary (Last 24 hours) at 01/15/15 0925 Last data filed at 01/15/15 0400  Gross per 24 hour  Intake    830 ml  Output   1500 ml  Net   -670 ml   Exam: General: No acute respiratory distress - thin  Lungs: Clear to auscultation bilaterally without wheezes or crackles Cardiovascular: Regular rate and rhythm without murmur gallop or rub normal S1 and S2 Abdomen: Nontender, nondistended, soft, bowel sounds positive, no rebound, no ascites, no appreciable mass Extremities: No significant cyanosis, clubbing, or edema bilateral lower extremities  Data Reviewed: Basic Metabolic Panel:  Recent Labs Lab 01/09/15 1440  01/10/15 0427  01/11/15 0600 01/12/15 0440 01/13/15 0525 01/14/15 0405 01/15/15 0415  NA  --   < >  --   < > 145 145 139 140 140  K  --   < >  --   < > 4.2 4.3 3.6 3.5 3.3*  CL  --   < >  --   < > 110 111 107 107 105  CO2  --   < >  --   < >  22 24 25 23 27   GLUCOSE  --   < >  --   < > 100* 77 107* 107* 120*  BUN  --   < >  --   < > 16 17 17 14 14   CREATININE  --   < >  --   < > 1.00 1.07 1.19 1.02 1.00  CALCIUM  --   < >  --   < > 9.1 8.9 8.6 8.9 8.8  MG 2.0  --  1.9  --  1.7 2.2  --   --   --   PHOS 4.8*  --  2.6  --  5.5* 3.6  --   --   --   < > = values in this interval not displayed.  Liver Function Tests:  Recent Labs Lab 01/12/15 0440  AST 43*  ALT 29  ALKPHOS 98  BILITOT 0.6  PROT 7.0  ALBUMIN 2.5*   Coags:  Recent Labs Lab 01/09/15 1411 01/09/15 2154  INR 1.16 1.08     Recent Labs Lab 01/09/15 1411 01/09/15 2154  APTT 35 43*    CBC:  Recent Labs Lab 01/09/15 1411  01/12/15 0440 01/12/15 0835 01/13/15 0525 01/14/15 0405 01/15/15 0415  WBC 6.5  < > 24.0* 24.3* 15.4* 11.8* 9.5  NEUTROABS 3.7  --   --   --   --   --   --   HGB 12.5*  < > 12.6* 12.4* 11.2* 10.2* 10.5*  HCT 39.7  < > 38.8* 38.4* 33.6* 31.1* 31.7*  MCV 88.8  < > 86.0 85.1 85.1 82.9 82.6  PLT 223  < > 374 353 306 297 311  < > = values in this interval not displayed.  Cardiac Enzymes:  Recent Labs Lab 01/09/15 1420 01/10/15 0227 01/10/15 0825  TROPONINI <0.03 0.19* 0.13*   CBG:  Recent Labs Lab 01/10/15 2039 01/11/15 0039 01/11/15 0430 01/11/15 0725 01/11/15 1142  GLUCAP 91 91 91 89 90    Recent Results (from the past 240 hour(s))  MRSA PCR Screening     Status: None   Collection Time: 01/09/15  4:50 PM  Result Value Ref Range Status   MRSA by PCR NEGATIVE NEGATIVE Final    Comment:        The GeneXpert MRSA Assay (FDA approved for NASAL specimens only), is one component of a comprehensive MRSA colonization surveillance program. It is not intended to diagnose MRSA infection nor to guide or monitor treatment for MRSA infections.   Culture, blood (routine x 2)     Status: None (Preliminary result)   Collection Time: 01/12/15  8:20 AM  Result Value Ref Range Status   Specimen Description BLOOD LEFT ANTECUBITAL  Final   Special Requests BOTTLES DRAWN AEROBIC ONLY 4.0CCS  Final   Culture   Final           BLOOD CULTURE RECEIVED NO GROWTH TO DATE CULTURE WILL BE HELD FOR 5 DAYS BEFORE ISSUING A FINAL NEGATIVE REPORT [facility]formed at [facility]    Report Status PENDING  Incomplete  Culture, blood (routine x 2)     Status: None (Preliminary result)   Collection Time: 01/12/15  8:25 AM  Result Value Ref Range Status   Specimen Description BLOOD RIGHT HAND  Final   Special Requests BOTTLES DRAWN AEROBIC ONLY 1.5CCS  Final   Culture   Final            BLOOD CULTURE RECEIVED NO GROWTH TO DATE  CULTURE WILL BE HELD FOR 5 DAYS BEFORE ISSUING A FINAL NEGATIVE REPORT Note: Culture results may be compromised due to an inadequate volume of blood received in culture bottles. Performed at [facility]    Report Status PENDING  Incomplete  Urine culture     Status: None (Preliminary result)   Collection Time: 01/12/15  1:09 PM  Result Value Ref Range Status   Specimen Description URINE, CATHETERIZED  Final   Special Requests NONE  Final   Colony Count   Final    95,000 COLONIES/ML Performed at [facility]    Culture   Final    GRAM NEGATIVE RODS Performed at [facility]    Report Status PENDING  Incomplete     Studies:  Recent x-ray studies have been reviewed in detail by the Attending Physician  Scheduled Meds:  Scheduled Meds: . amiodarone  200 mg Oral BID  . aspirin  81 mg Oral Daily  . atorvastatin  80 mg Oral q1800  . carvedilol  6.25 mg Oral BID WC  . ceFEPime (MAXIPIME) IV  1 g Intravenous 3 times per day  . enoxaparin (LOVENOX) injection  40 mg Subcutaneous Q24H  . potassium chloride  30 mEq Oral Q2H  . vancomycin  750 mg Intravenous Q12H    Time spent on care of this patient: 35 mins   Versia Mignogna T , MD   [facility] Office  [phone] Pager - Text Page per Loretha Stapler as per below:  On-Call/Text Page:      [email or web address]      password [email or web address]  If 7PM-7AM, please contact night-coverage [email or web address] Password [email or web address] 01/15/2015, 9:25 AM   LOS: 6 days

## 2015-01-15 NOTE — Progress Notes (Signed)
Pt becoming increasingly verbally abusive to staff stating "get the fuck out of my room" along with other profanities. Pt refused to take any of his 0600 meds. Dr. Truitt MerleNotified.

## 2015-01-15 NOTE — Consult Note (Signed)
Reason for Consult: Recurrent VF  Referring Physician: Dr. Juanell Fairly is an 69 y.o. male.   HPI: the patient is a 69 yo man with a h/o CAD, s/p MI, chronic systolic heart failure, (? ischemic and non-ischemic), homeless, chronic cocaine use who presented with VF 2 months ago in the setting of an acute MI. He was in his usual state of health when he collapsed and was again found in VF and defibrillated. He has undergone repeat heart catheterization. He has 2 tight RCA lesions which were not thought to be treatable. His EF by echo is 25%. He has an inferobasilar aneurysm. We are asked to see regarding ICD insertion. He was placed on IV and now po amiodarone.  He has lost over 35 lbs of weight, for unclear reasons.   PMH: Past Medical History  Diagnosis Date  . Smoker   . Cardiac arrest     10/2014  . MI (myocardial infarction)     10/2014 - PCI to RCA  . Ischemic cardiomyopathy   . Polysubstance abuse     PSHX: Past Surgical History  Procedure Laterality Date  . Left heart catheterization with coronary angiogram N/A 10/27/2014    Procedure: LEFT HEART CATHETERIZATION WITH CORONARY ANGIOGRAM;  Surgeon: Kathleene Hazel, MD;  Location: [facility] CATH LAB;  Service: Cardiovascular;  Laterality: N/A;  . Left heart catheterization with coronary angiogram N/A 01/13/2015    Procedure: LEFT HEART CATHETERIZATION WITH CORONARY ANGIOGRAM;  Surgeon: Lesleigh Noe, MD;  Location: [facility] CATH LAB;  Service: Cardiovascular;  Laterality: N/A;    FAMHX:His brother died suddenly  Social History:  reports that he has been smoking Cigarettes.  He has a 35 pack-year smoking history. He does not have any smokeless tobacco history on file. He reports that he uses illicit drugs ("Crack" cocaine). His alcohol history is not on file.  Allergies:  Allergies  Allergen Reactions  . Aspirin Nausea And Vomiting    Medications: reviewed  Dg Chest Port 1 View  01/14/2015   CLINICAL DATA:   Possible acquired pneumonia  EXAM: PORTABLE CHEST - 1 VIEW  COMPARISON:  01/12/2015  FINDINGS: Cardiac shadow remains enlarged. Bibasilar infiltrative changes are again noted. AV right jugular central line is again seen and stable. No acute abnormality is noted.  IMPRESSION: Stable bibasilar changes   Electronically Signed   By: Alcide Clever M.D.   On: 01/14/2015 07:16    ROS  As stated in the HPI and negative for all other systems.  Physical Exam  Vitals:Blood pressure 126/71, pulse 78, temperature 99.5 F (37.5 C), temperature source Oral, resp. rate 24, height  (1.88 m), weight 150 lb 5.7 oz (68.2 kg), SpO2 97 %.  1 middle aged man, NAD HEENT: Unremarkable Neck:  7 cm JVD, no thyromegally, right IJ in place Back:  No CVA tenderness Lungs:  Clear except for scattered basilar rales. HEART:  Regular rate rhythm, no murmurs, no rubs, no clicks, soft S4.  Abd:  Flat, positive bowel sounds, no organomegally, no rebound, no guarding Ext:  2 plus pulses, no edema, no cyanosis, no clubbing Skin:  No rashes no nodules Neuro:  CN II through XII intact, motor grossly intact  Cardiac catheterization - I have reviewed personally  2D echo - I have reviewed personally  Assessment/Plan: 1. VF arrest, not in setting of acute MI 2. Ischemic vs mixed cardiomyopathy, EF 25% 3. Persistent area of RCA ischemia 4. Polysubstance abuse 5. Homeless Rec:  a difficult situation. He is at high risk for recurrent sudden death. Unfortunately his social issues including cocaine abuse and unrevacularized CAD make him not a candidate for ICD implantation. I suspect perinfarction ischemia (2 tight RCA lesions) in the setting of a large inferobasilar aneurysm is the driver of his VF. Would recommend amiodarone and beta blocker uptitration. Would send home on 400 mg bid for a month of amio, and he can be scheduled to see me back in the office in 4 weeks.   Sharlot GowdaGregg TaylorMD 01/15/2015, 10:39 AM

## 2015-01-16 LAB — CBC
HCT: 34.1 % — ABNORMAL LOW (ref 39.0–52.0)
Hemoglobin: 11 g/dL — ABNORMAL LOW (ref 13.0–17.0)
MCH: 26.7 pg (ref 26.0–34.0)
MCHC: 32.3 g/dL (ref 30.0–36.0)
MCV: 82.8 fL (ref 78.0–100.0)
PLATELETS: 362 10*3/uL (ref 150–400)
RBC: 4.12 MIL/uL — ABNORMAL LOW (ref 4.22–5.81)
RDW: 14.9 % (ref 11.5–15.5)
WBC: 10 10*3/uL (ref 4.0–10.5)

## 2015-01-16 LAB — COMPREHENSIVE METABOLIC PANEL
ALK PHOS: 80 U/L (ref 39–117)
ALT: 22 U/L (ref 0–53)
ANION GAP: 6 (ref 5–15)
AST: 23 U/L (ref 0–37)
Albumin: 2.2 g/dL — ABNORMAL LOW (ref 3.5–5.2)
BUN: 10 mg/dL (ref 6–23)
CALCIUM: 8.7 mg/dL (ref 8.4–10.5)
CO2: 29 mmol/L (ref 19–32)
Chloride: 107 mmol/L (ref 96–112)
Creatinine, Ser: 0.91 mg/dL (ref 0.50–1.35)
GFR, EST NON AFRICAN AMERICAN: 86 mL/min — AB (ref >90)
Glucose, Bld: 111 mg/dL — ABNORMAL HIGH (ref 70–99)
Potassium: 3.3 mmol/L — ABNORMAL LOW (ref 3.5–5.1)
Sodium: 142 mmol/L (ref 135–145)
TOTAL PROTEIN: 6.2 g/dL (ref 6.0–8.3)
Total Bilirubin: 0.7 mg/dL (ref 0.3–1.2)

## 2015-01-16 LAB — MAGNESIUM: Magnesium: 1.9 mg/dL (ref 1.5–2.5)

## 2015-01-16 MED ORDER — AMIODARONE HCL 200 MG PO TABS
400.0000 mg | ORAL_TABLET | Freq: Two times a day (BID) | ORAL | Status: DC
  Administered 2015-01-16 – 2015-01-18 (×4): 400 mg via ORAL
  Filled 2015-01-16 (×5): qty 2

## 2015-01-16 MED ORDER — POTASSIUM CHLORIDE CRYS ER 20 MEQ PO TBCR
40.0000 meq | EXTENDED_RELEASE_TABLET | Freq: Two times a day (BID) | ORAL | Status: DC
  Administered 2015-01-16 – 2015-01-18 (×5): 40 meq via ORAL
  Filled 2015-01-16 (×6): qty 2

## 2015-01-16 MED ORDER — CARVEDILOL 12.5 MG PO TABS
12.5000 mg | ORAL_TABLET | Freq: Two times a day (BID) | ORAL | Status: DC
  Administered 2015-01-16 – 2015-01-18 (×3): 12.5 mg via ORAL
  Filled 2015-01-16 (×6): qty 1

## 2015-01-16 MED ORDER — ENSURE COMPLETE PO LIQD
237.0000 mL | Freq: Two times a day (BID) | ORAL | Status: DC
  Administered 2015-01-16 – 2015-01-17 (×3): 237 mL via ORAL

## 2015-01-16 NOTE — Progress Notes (Signed)
Patient transferred from 2H, VSS, CCMD notified and placed on monitor. Oriented to room and floor.  Will continue to monitor.

## 2015-01-16 NOTE — Progress Notes (Signed)
NUTRITION FOLLOW UP  Pt meets criteria for moderate MALNUTRITION in the context of chronic illness as evidenced by mild to moderate fat and muscle wasting.  Intervention:   -Ensure Complete po BID, each supplement provides 350 kcal and 13 grams of protein  Nutrition Dx:   Inadequate oral intake related to decreased appetite as evidenced by PO: 60%.   Goal:   Pt to meet >/= 90% of their estimated nutrition needs; unmet  Monitor:   PO/supplement intake, labs, weight changes, I/O's  Assessment:   69 year old male with history of recent VF arrest in 10/2014 2nd to RCA lesion. Presents again to ED 1/18 with VF arrest. ROSC after 8 mins ACLS. Total downtime ~20 mins. PCCM to admit.   Pt self-extubated on 01/11/15. Nutrition needs reestimated due to change in status.  SLP evaluated on 01/13/15 and study revealed no signs, symptoms, or risk for aspiration. Pt has been advanced to a Heart Healthy diet with thin liquids. Intake has been fair; noted 60% meal completion. RD to add Ensure for additional nutrition support, particularly due to malnutrition dx and poor social situation.  Pt s/p cardiac cath on 01/13/15.  Labs reviewed. Glucose: 107, Phos: 5.5.  Height: Ht Readings from Last 1 Encounters:  01/11/15  (1.88 m)    Weight Status:   Wt Readings from Last 1 Encounters:  01/13/15 150 lb 5.7 oz (68.2 kg)  01/10/15  147 lb 15.2 oz (67.11 kg)   Re-estimated needs:  Kcal: 2000-2200 Protein: 82-92 grams Fluid: 2.0-2.2 L  Skin: Intact  Diet Order: Diet Heart   Intake/Output Summary (Last 24 hours) at 01/16/15 1212 Last data filed at 01/16/15 0800  Gross per 24 hour  Intake    810 ml  Output   1550 ml  Net   -740 ml    Last BM: PTA   Labs:   Recent Labs Lab 01/10/15 0427  01/11/15 0600 01/12/15 0440  01/14/15 0405 01/15/15 0415 01/16/15 0533  NA  --   < > 145 145  < > 140 140 142  K  --   < > 4.2 4.3  < > 3.5 3.3* 3.3*  CL  --   < > 110 111  < > 107 105 107   CO2  --   < > 22 24  < > BUN  --   < > 16 17  < > CREATININE  --   < > 1.00 1.07  < > 1.02 1.00 0.91  CALCIUM  --   < > 9.1 8.9  < > 8.9 8.8 8.7  MG 1.9  --  1.7 2.2  --   --   --  1.9  PHOS 2.6  --  5.5* 3.6  --   --   --   --   GLUCOSE  --   < > 100* 77  < > 107* 120* 111*  < > = values in this interval not displayed.  CBG (last 3)  No results for input(s): GLUCAP in the last 72 hours.  Scheduled Meds: . amiodarone  400 mg Oral BID  . aspirin  81 mg Oral Daily  . atorvastatin  80 mg Oral q1800  . carvedilol  12.5 mg Oral BID WC  . ceFEPime (MAXIPIME) IV  1 g Intravenous 3 times per day  . enoxaparin (LOVENOX) injection  40 mg Subcutaneous Q24H  . potassium chloride  40 mEq Oral BID  .  vancomycin  750 mg Intravenous Q12H    Continuous Infusions:   Annaly Skop A. Mayford KnifeWilliams, RD, LDN, CDE Pager: [phone] After hours Pager: [phone]

## 2015-01-16 NOTE — Progress Notes (Signed)
PROGRESS NOTE Primary cardiologist :  [name]  Subjective:    [name] is a 69 yo with hx of CAD, chronic systolic CHF, chronic cocaine use.  The patient is homeless.    Cath on jan. 23 showed 50% LAD stenosis, 95% - 99% stenosis in the RCA at the ostium of the PDS.  Not optimal for PCI.    He has been seen by EP and is not a candidate for ICD secondary to his social issues.    Recommended amiodarone and beta blockers.   Objective:    Vital Signs:   Temp:  [97.4 F (36.3 C)-100.3 F (37.9 C)] 100.3 F (37.9 C) (01/25 0743) Pulse Rate:  [69-95] 95 (01/25 0800) Resp:  [19-38] 19 (01/25 0800) BP: (107-144)/(56-81) 125/66 mmHg (01/25 0800) SpO2:  [89 %-99 %] 97 % (01/25 0800)  Last BM Date:  (PTA)   24-hour weight change: Weight change:   Weight trends: Filed Weights   01/10/15 0600 01/11/15 0400 01/13/15 1400  Weight: 147 lb 15.2 oz (67.11 kg) 153 lb 14.5 oz (69.81 kg) 150 lb 5.7 oz (68.2 kg)    Intake/Output:  01/24 0701 - 01/25 0700 In: 200 [IV Piggyback:200] Out: 1550 [Urine:1550]     Physical Exam: BP 125/66 mmHg  Pulse 95  Temp(Src) 100.3 F (37.9 C) (Oral)  Resp 19  Ht  (1.88 m)  Wt 150 lb 5.7 oz (68.2 kg)  BMI 19.30 kg/m2  SpO2 97%  Wt Readings from Last 3 Encounters:  01/13/15 150 lb 5.7 oz (68.2 kg)  11/05/14 149 lb 0.5 oz (67.6 kg)    General: Vital signs reviewed and noted. Chronically ill appearing   Head: Normocephalic, atraumatic.  Eyes: conjunctivae/corneas clear.  EOM's intact.   Throat: normal  Neck:  thin  Lungs:    clear   Heart:  RR,   Abdomen:  Soft, non-tender, non-distended    Extremities: Thin ,  No edema    Neurologic: A&O X3, CN II - XII are grossly intact.   Psych: Normal     Labs: BMET:  Recent Labs  01/15/15 0415 01/16/15 0533  NA 140 142  K 3.3* 3.3*  CL 105 107  CO2 27 29  GLUCOSE 120* 111*  BUN 14 10  CREATININE 1.00 0.91  CALCIUM 8.8 8.7  MG  --  1.9    Liver function tests:  Recent  Labs  01/16/15 0533  AST 23  ALT 22  ALKPHOS 80  BILITOT 0.7  PROT 6.2  ALBUMIN 2.2*   No results for input(s): LIPASE, AMYLASE in the last 72 hours.  CBC:  Recent Labs  01/15/15 0415 01/16/15 0533  WBC 9.5 10.0  HGB 10.5* 11.0*  HCT 31.7* 34.1*  MCV 82.6 82.8  PLT 311 362    Cardiac Enzymes: No results for input(s): CKTOTAL, CKMB, TROPONINI in the last 72 hours.  Coagulation Studies: No results for input(s): LABPROT, INR in the last 72 hours.  Other: Invalid input(s): POCBNP No results for input(s): DDIMER in the last 72 hours. No results for input(s): HGBA1C in the last 72 hours. No results for input(s): CHOL, HDL, LDLCALC, TRIG, CHOLHDL in the last 72 hours. No results for input(s): TSH, T4TOTAL, T3FREE, THYROIDAB in the last 72 hours.  Invalid input(s): FREET3 No results for input(s): VITAMINB12, FOLATE, FERRITIN, TIBC, IRON, RETICCTPCT in the last 72 hours.   Other results:  Tele:  NSR .   Medications:    Infusions: . sodium  chloride 10 mL/hr at 01/14/15 2000    Scheduled Medications: . amiodarone  200 mg Oral BID  . aspirin  81 mg Oral Daily  . atorvastatin  80 mg Oral q1800  . carvedilol  6.25 mg Oral BID WC  . ceFEPime (MAXIPIME) IV  1 g Intravenous 3 times per day  . enoxaparin (LOVENOX) injection  40 mg Subcutaneous Q24H  . vancomycin  750 mg Intravenous Q12H    Assessment/ Plan:      Cardiac arrest:  Due to chronic cocaine abuse.  I discussed this with him. I'm not sure that he agrees.  He is not a candidate for ICD He has CAD - primarily in his RCA that cannot be stented.   He has had POBA but due to the calcifications in the RCA, stents were not deployed.     Malnutrition of moderate degree    Acute respiratory failure with hypoxia   Anoxic encephalopathy   VF (ventricular fibrillation) - he is on amio HR and BP are well controlled.     CAD in native artery - has a distal RCA stenosis that cannot be stented due to calcification  and inability to fully expand balloon . Continue medical therapy. i've advised him to avoid cocaine.     Disposition: follow up with cardiology as needed [name]( [name] )  May follow up with EP [name]( [name])   Length of Stay: 7  [name], Scott HagemanJr., MD, [facility] 01/16/2015, 10:47 AM Office [phone] Pager [phone]

## 2015-01-16 NOTE — Progress Notes (Signed)
ANTIBIOTIC CONSULT NOTE - FOLLOW UP  Pharmacy Consult for cefepime, vancomycin Indication: HCAP  Allergies  Allergen Reactions  . Aspirin Nausea And Vomiting    Patient Measurements: Height:  (188 cm) Weight: 150 lb 5.7 oz (68.2 kg) IBW/kg (Calculated) : 82.2  Vital Signs: Temp: 98.5 F (36.9 C) (01/25 1159) Temp Source: Oral (01/25 1159) BP: 130/97 mmHg (01/25 1159) Pulse Rate: 72 (01/25 1159) Intake/Output from previous day: 01/24 0701 - 01/25 0700 In: 200 [IV Piggyback:200] Out: 1550 [Urine:1550] Intake/Output from this shift: Total I/O In: 610 [P.O.:610] Out: -   Labs:  Recent Labs  01/14/15 0405 01/15/15 0415 01/16/15 0533  WBC 11.8* 9.5 10.0  HGB 10.2* 10.5* 11.0*  PLT 297 311 362  CREATININE 1.02 1.00 0.91   Estimated Creatinine Clearance: 76 mL/min (by C-G formula based on Cr of 0.91). No results for input(s): VANCOTROUGH, VANCOPEAK, VANCORANDOM, GENTTROUGH, GENTPEAK, GENTRANDOM, TOBRATROUGH, TOBRAPEAK, TOBRARND, AMIKACINPEAK, AMIKACINTROU, AMIKACIN in the last 72 hours.   Microbiology: Recent Results (from the past 720 hour(s))  MRSA PCR Screening     Status: None   Collection Time: 01/09/15  4:50 PM  Result Value Ref Range Status   MRSA by PCR NEGATIVE NEGATIVE Final    Comment:        The GeneXpert MRSA Assay (FDA approved for NASAL specimens only), is one component of a comprehensive MRSA colonization surveillance program. It is not intended to diagnose MRSA infection nor to guide or monitor treatment for MRSA infections.   Culture, blood (routine x 2)     Status: None (Preliminary result)   Collection Time: 01/12/15  8:20 AM  Result Value Ref Range Status   Specimen Description BLOOD LEFT ANTECUBITAL  Final   Special Requests BOTTLES DRAWN AEROBIC ONLY 4.0CCS  Final   Culture   Final           BLOOD CULTURE RECEIVED NO GROWTH TO DATE CULTURE WILL BE HELD FOR 5 DAYS BEFORE ISSUING A FINAL NEGATIVE REPORT Performed at [facility]    Report Status PENDING  Incomplete  Culture, blood (routine x 2)     Status: None (Preliminary result)   Collection Time: 01/12/15  8:25 AM  Result Value Ref Range Status   Specimen Description BLOOD RIGHT HAND  Final   Special Requests BOTTLES DRAWN AEROBIC ONLY 1.5CCS  Final   Culture   Final           BLOOD CULTURE RECEIVED NO GROWTH TO DATE CULTURE WILL BE HELD FOR 5 DAYS BEFORE ISSUING A FINAL NEGATIVE REPORT Note: Culture results may be compromised due to an inadequate volume of blood received in culture bottles. Performed at [facility]    Report Status PENDING  Incomplete  Urine culture     Status: None   Collection Time: 01/12/15  1:09 PM  Result Value Ref Range Status   Specimen Description URINE, CATHETERIZED  Final   Special Requests NONE  Final   Colony Count   Final    95,000 COLONIES/ML Performed at [facility]    Culture   Final    ESCHERICHIA COLI Performed at [facility]    Report Status 01/15/2015 FINAL  Final   Organism ID, Bacteria ESCHERICHIA COLI  Final      Susceptibility   Escherichia coli - MIC*    AMPICILLIN >=32 RESISTANT Resistant     CEFAZOLIN <=4 SENSITIVE Sensitive     CEFTRIAXONE <=1 SENSITIVE Sensitive     CIPROFLOXACIN <=  0.25 SENSITIVE Sensitive     GENTAMICIN <=1 SENSITIVE Sensitive     LEVOFLOXACIN <=0.12 SENSITIVE Sensitive     NITROFURANTOIN <=16 SENSITIVE Sensitive     TOBRAMYCIN <=1 SENSITIVE Sensitive     TRIMETH/SULFA <=20 SENSITIVE Sensitive     PIP/TAZO <=4 SENSITIVE Sensitive     * ESCHERICHIA COLI    Anti-infectives    Start     Dose/Rate Route Frequency Ordered Stop   01/13/15 1400  ceFEPIme (MAXIPIME) 1 g in dextrose 5 % 50 mL IVPB     1 g100 mL/hr over 30 Minutes Intravenous 3 times per day 01/13/15 1311     01/13/15 1400  vancomycin (VANCOCIN) IVPB 750 mg/150 ml premix  Status:  Discontinued     750 mg150 mL/hr over 60 Minutes Intravenous Every 12 hours 01/13/15 1311 01/16/15 1346       Assessment: 69 yo male on cefepime and vancomycin for HCAP.  WBC= 10, afeb, SCr= 0.91 and CrCl ~ 75.  Urine cultures showing E.coli (resis to amp only). Spoke with Dr. Sharon SellerMcClung- plans to discontinue vancomycin and maintain cefepime for a total of 7 days.   1/22 Vanc>> 1/25 1/22 Cefepime>>  1/21 blood x2>> ngtd 1/21 urine>> e coli (resis to amp only) 1/18 MRSA PCR neg  Plan:  -No cefepime changes needed -Vancomycin has been discontinued -Will follow renal function, cultures and clinical progress  [name], Pharm D 01/16/2015 1:53 PM

## 2015-01-16 NOTE — Progress Notes (Signed)
[facility] TEAM 1 - Stepdown/ICU TEAM Progress Note  QUAMEL FITZMAURICE RUE:[id number] DOB: 04-07-1946 DOA: 01/09/2015 PCP: No PCP Per Patient  Admit HPI / Brief Narrative: 69 year old male with a hx of VF arrest 2nd to MI in 10/2014 (taken to cath lab w/ PCI to occluded RCA - intubated on hypothermia protocol for several days w/ resultant encephalopathy and cardiomyopathy). On 1/18 he presented to [facility] after a witnessed arrest. 15 mins downtime prior to EMS arrival. ACLS was started. Initial rythym was VF. ROSC was achieved after 10 minutes ACLS with defibrillation x 4. Total downtime ~20 mins. He was intubated in ED.  Significant events: 11/5 - 11/14 > admission for VF arrest, underwent hypothermia and PCI to RCA 1/18 CVL left IF > 1/18 A-Line left Radial > 1/18 EEG normal 1/19 Repeat ECHO 25-30% with inferior wall hypokinesis. Unchanged from 10/2014 1/20 Self extubated 1/20 developed Afib with RVR > started amio, coreg, diltiazem and digoxin 1/22 LHC > high grade RCA disease, large inferobasilar aneursysm LV, widely patent left coronary artery system, LVEF 25%  HPI/Subjective: Pt has no new complaints today.  He denies cp, n/v, or sob.  He states his appetite is improving.    Assessment/Plan:  Acute hypoxemic respiratory failure due to ACS - resolved  Suspected RLL HCAP Cont empiric abx tx to complete a 7 day course   VF arrest Per Cardiology / EP - remains at risk for recurrent sudden death - not a candidate for ICD - EP suggests amio  BID for a month, and upward titration of BB - to f/u w/ EP as outpt 4 weeks post d/c   Ischemic cardiomyopathy / chronic systolic CHF - LVEF 25% Clinically appears well compensated at present - begin PT/OT  CAD w/ persistent area of RCA ischemia  LHC without significant change in coronary disease since 10/2014  Hypertensive Emergency Resolved - BP now well controlled   Mild hypokalemia  Mg ok - K+ not yet at goal - cont to replace and  follow    Atrial fibrillation with RVR Not a candidate for long term anticoag due to compliance and substance abuse - NSR at present   E coli UTI  Should be more than adequately covered w/ his current abx tx   Lactic acidosis  Due to arrest - resolved   Chronic anemia Follow  Cocaine abuse  UDS negative this admit   Homelessness  Code Status: FULL Family Communication: no family present at time of exam Disposition Plan: stable for transfer to tele bed - begin PT/OT   Consultants: [facility] Cards EP   Antibiotics: Cefepime 1/22 > Vanc 1/22 >  DVT prophylaxis: lovenox  Objective: Blood pressure 116/58, pulse 83, temperature 100.3 F (37.9 C), temperature source Oral, resp. rate 21, height  (1.88 m), weight 68.2 kg (150 lb 5.7 oz), SpO2 95 %.  Intake/Output Summary (Last 24 hours) at 01/16/15 1029 Last data filed at 01/15/15 2300  Gross per 24 hour  Intake    200 ml  Output   1550 ml  Net  -1350 ml   Exam: General: No acute respiratory distress - thin / cachectic  Lungs: Clear to auscultation bilaterally without wheezes or crackles Cardiovascular: Regular rate and rhythm without murmur gallop or rub Abdomen: Nontender, nondistended, soft, bowel sounds positive, no rebound, no ascites, no appreciable mass Extremities: No significant cyanosis, clubbing, or edema bilateral lower extremities  Data Reviewed: Basic Metabolic Panel:  Recent Labs Lab 01/09/15 1440  01/10/15  8295  01/11/15 0600 01/12/15 0440 01/13/15 0525 01/14/15 0405 01/15/15 0415 01/16/15 0533  NA  --   < >  --   < > 145 145 139 140 140 142  K  --   < >  --   < > 4.2 4.3 3.6 3.5 3.3* 3.3*  CL  --   < >  --   < > 110 111 107 107 105 107  CO2  --   < >  --   < > GLUCOSE  --   < >  --   < > 100* 77 107* 107* 120* 111*  BUN  --   < >  --   < > CREATININE  --   < >  --   < > 1.00 1.07 1.19 1.02 1.00 0.91  CALCIUM  --   < >  --   < > 9.1 8.9 8.6 8.9 8.8  8.7  MG 2.0  --  1.9  --  1.7 2.2  --   --   --  1.9  PHOS 4.8*  --  2.6  --  5.5* 3.6  --   --   --   --   < > = values in this interval not displayed.  Liver Function Tests:  Recent Labs Lab 01/12/15 0440 01/16/15 0533  AST 43* 23  ALT 29 22  ALKPHOS 98 80  BILITOT 0.6 0.7  PROT 7.0 6.2  ALBUMIN 2.5* 2.2*   Coags:  Recent Labs Lab 01/09/15 1411 01/09/15 2154  INR 1.16 1.08    Recent Labs Lab 01/09/15 1411 01/09/15 2154  APTT 35 43*    CBC:  Recent Labs Lab 01/09/15 1411  01/12/15 0835 01/13/15 0525 01/14/15 0405 01/15/15 0415 01/16/15 0533  WBC 6.5  < > 24.3* 15.4* 11.8* 9.5 10.0  NEUTROABS 3.7  --   --   --   --   --   --   HGB 12.5*  < > 12.4* 11.2* 10.2* 10.5* 11.0*  HCT 39.7  < > 38.4* 33.6* 31.1* 31.7* 34.1*  MCV 88.8  < > 85.1 85.1 82.9 82.6 82.8  PLT 223  < > 353 306 297 311 362  < > = values in this interval not displayed.  Cardiac Enzymes:  Recent Labs Lab 01/09/15 1420 01/10/15 0227 01/10/15 0825  TROPONINI <0.03 0.19* 0.13*   CBG:  Recent Labs Lab 01/10/15 2039 01/11/15 0039 01/11/15 0430 01/11/15 0725 01/11/15 1142  GLUCAP 91 91 91 89 90    Recent Results (from the past 240 hour(s))  MRSA PCR Screening     Status: None   Collection Time: 01/09/15  4:50 PM  Result Value Ref Range Status   MRSA by PCR NEGATIVE NEGATIVE Final    Comment:        The GeneXpert MRSA Assay (FDA approved for NASAL specimens only), is one component of a comprehensive MRSA colonization surveillance program. It is not intended to diagnose MRSA infection nor to guide or monitor treatment for MRSA infections.   Culture, blood (routine x 2)     Status: None (Preliminary result)   Collection Time: 01/12/15  8:20 AM  Result Value Ref Range Status   Specimen Description BLOOD LEFT ANTECUBITAL  Final   Special Requests BOTTLES DRAWN AEROBIC ONLY 4.0CCS  Final   Culture   Final  BLOOD CULTURE RECEIVED NO GROWTH TO DATE CULTURE WILL BE  HELD FOR 5 DAYS BEFORE ISSUING A FINAL NEGATIVE REPORT [facility]    Report Status PENDING  Incomplete  Culture, blood (routine x 2)     Status: None (Preliminary result)   Collection Time: 01/12/15  8:25 AM  Result Value Ref Range Status   Specimen Description BLOOD RIGHT HAND  Final   Special Requests BOTTLES DRAWN AEROBIC ONLY 1.5CCS  Final   Culture   Final           BLOOD CULTURE RECEIVED NO GROWTH TO DATE CULTURE WILL BE HELD FOR 5 DAYS BEFORE ISSUING A FINAL NEGATIVE REPORT Note: Culture results may be compromised due to an inadequate volume of blood received in culture bottles. Performed at [facility]    Report Status PENDING  Incomplete  Urine culture     Status: None   Collection Time: 01/12/15  1:09 PM  Result Value Ref Range Status   Specimen Description URINE, CATHETERIZED  Final   Special Requests NONE  Final   Colony Count   Final    95,000 COLONIES/ML Performed at [facility]    Culture   Final    ESCHERICHIA COLI Performed at [facility]    Report Status 01/15/2015 FINAL  Final   Organism ID, Bacteria ESCHERICHIA COLI  Final      Susceptibility   Escherichia coli - MIC*    AMPICILLIN >=32 RESISTANT Resistant     CEFAZOLIN <=4 SENSITIVE Sensitive     CEFTRIAXONE <=1 SENSITIVE Sensitive     CIPROFLOXACIN <=0.25 SENSITIVE Sensitive     GENTAMICIN <=1 SENSITIVE Sensitive     LEVOFLOXACIN <=0.12 SENSITIVE Sensitive     NITROFURANTOIN <=16 SENSITIVE Sensitive     TOBRAMYCIN <=1 SENSITIVE Sensitive     TRIMETH/SULFA <=20 SENSITIVE Sensitive     PIP/TAZO <=4 SENSITIVE Sensitive     * ESCHERICHIA COLI     Studies:  Recent x-ray studies have been reviewed in detail by the Attending Physician  Scheduled Meds:  Scheduled Meds: . amiodarone  200 mg Oral BID  . aspirin  81 mg Oral Daily  . atorvastatin  80 mg Oral q1800  . carvedilol  6.25 mg Oral BID WC  . ceFEPime (MAXIPIME) IV  1 g Intravenous 3 times per  day  . enoxaparin (LOVENOX) injection  40 mg Subcutaneous Q24H  . vancomycin  750 mg Intravenous Q12H    Time spent on care of this patient: 35 mins   Edu On T , MD   [facility] Office  [phone] Pager - Text Page per Loretha StaplerAmion as per below:  On-Call/Text Page:      [email or web address]      password [email or web address]  If 7PM-7AM, please contact night-coverage [email or web address] Password [email or web address] 01/16/2015, 10:29 AM   LOS: 7 days

## 2015-01-16 NOTE — Progress Notes (Signed)
Found at patient bedside a cup full of pills, appears to be from last night through today doses of potassium, aspirin, amiodarone, coreg, lipitor. "Patient states I'll take them eventually." Explained that patient needs to take at designated time or refuse to RN so we know what he has and has not taken and when. Some of the meds would have been a double dose (from last night and this am) and advised he can not take now. Observed him take his current meds due. Patient verbalized understanding and pills disposed of.

## 2015-01-16 NOTE — Progress Notes (Signed)
Patient with temp 101.3, MD notified, tylenol given. Patient already receiving IV abx for pna. Good PO fluid intake although poor appetite. Will continue to monitor.

## 2015-01-17 LAB — BASIC METABOLIC PANEL
ANION GAP: 9 (ref 5–15)
BUN: 11 mg/dL (ref 6–23)
CALCIUM: 8.7 mg/dL (ref 8.4–10.5)
CO2: 27 mmol/L (ref 19–32)
Chloride: 105 mmol/L (ref 96–112)
Creatinine, Ser: 0.95 mg/dL (ref 0.50–1.35)
GFR, EST NON AFRICAN AMERICAN: 84 mL/min — AB (ref >90)
Glucose, Bld: 106 mg/dL — ABNORMAL HIGH (ref 70–99)
POTASSIUM: 3.8 mmol/L (ref 3.5–5.1)
Sodium: 141 mmol/L (ref 135–145)

## 2015-01-17 NOTE — Evaluation (Signed)
Physical Therapy Evaluation Patient Details Name: [name] MRN: [id number] DOB: 09-30-1946 Today's Date: 01/17/2015   History of Present Illness  69 year old male with a hx of VF arrest 2nd to MI in 10/2014 (taken to cath lab w/ PCI to occluded RCA - intubated on hypothermia protocol for several days w/ resultant encephalopathy and cardiomyopathy). On 1/18 he presented to [facility] after a witnessed arrest. ETT 1/18-1/20 self extubated. Cocaine abuse, Afib  Clinical Impression  Pt moving well with transfers but somewhat resistant to questions regarding PLOF. Pt with unsteady toe in gait but states only due to not having shoes, no shoes in room. Pt with balance deficits and fall risk but does not demonstrate understanding or concern over deficits. Pt encouraged to have friends bring shoes to further assess balance. Pt will benefit from acute therapy to maximize gait and balance prior to D/C to decrease fall risk.     Follow Up Recommendations Outpatient PT    Equipment Recommendations  None recommended by PT    Recommendations for Other Services       Precautions / Restrictions Precautions Precautions: Fall      Mobility  Bed Mobility Overal bed mobility: Modified Independent                Transfers Overall transfer level: Modified independent                  Ambulation/Gait Ambulation/Gait assistance: Min guard Ambulation Distance (Feet): 300 Feet Assistive device: None Gait Pattern/deviations: Step-through pattern;Decreased stride length;Trunk flexed;Drifts right/left   Gait velocity interpretation: at or above normal speed for age/gender General Gait Details: bil toe in with maintained bil knee flexion with gait with pt veering right and left with gait. Pt states related to not wearing his shoes currently. Pt denies any falls in the last year. Pt unable to achieve and maintain toes in midline with knee extension  Stairs Stairs: Yes Stairs assistance:  Modified independent (Device/Increase time) Stair Management: One rail Right;Forwards;Alternating pattern Number of Stairs: 3    Wheelchair Mobility    Modified Rankin (Stroke Patients Only)       Balance Overall balance assessment: Needs assistance   Sitting balance-Leahy Scale: Good       Standing balance-Leahy Scale: Good                               Pertinent Vitals/Pain Pain Assessment: 0-10 Pain Score: 2  Pain Location: abdomen Pain Descriptors / Indicators: Sore Pain Intervention(s): Repositioned  HR 82    Home Living Family/patient expects to be discharged to:: Private residence Living Arrangements: Non-relatives/Friends Available Help at Discharge: Friend(s) Type of Home: House Home Access: Stairs to enter Entrance Stairs-Rails: None Secretary/administrator of Steps: 3 Home Layout: One level Home Equipment: None      Prior Function Level of Independence: Independent               Hand Dominance        Extremity/Trunk Assessment   Upper Extremity Assessment: Overall WFL for tasks assessed           Lower Extremity Assessment: Overall WFL for tasks assessed (bil toe in with maintained knee flexion in standing)         Communication   Communication: No difficulties  Cognition Arousal/Alertness: Awake/alert Behavior During Therapy: WFL for tasks assessed/performed Overall Cognitive Status: No family/caregiver present to determine baseline cognitive functioning Area of  Impairment: Safety/judgement         Safety/Judgement: Decreased awareness of safety          General Comments      Exercises        Assessment/Plan    PT Assessment Patient needs continued PT services  PT Diagnosis Abnormality of gait   PT Problem List Decreased activity tolerance;Decreased mobility;Decreased cognition;Decreased balance;Decreased safety awareness  PT Treatment Interventions Gait training;DME instruction;Balance  training;Functional mobility training;Therapeutic activities;Therapeutic exercise;Patient/family education   PT Goals (Current goals can be found in the Care Plan section) Acute Rehab PT Goals Patient Stated Goal: go home PT Goal Formulation: With patient Time For Goal Achievement: 01/31/15 Potential to Achieve Goals: Good    Frequency Min 3X/week   Barriers to discharge Decreased caregiver support      Co-evaluation               End of Session   Activity Tolerance: Patient tolerated treatment well Patient left: in chair;with call bell/phone within reach Nurse Communication: Mobility status;Precautions         Time: [id number] PT Time Calculation (min) (ACUTE ONLY): 16 min   Charges:   PT Evaluation $Initial PT Evaluation Tier I: 1 Procedure PT Treatments $Gait Training: 8-22 mins   PT G CodesDelorse Lek:        Tabor, Keosha Rossa Beth 01/17/2015, 7:58 AM Delaney MeigsMaija Tabor Caspian Deleonardis, PT [phone]

## 2015-01-17 NOTE — Progress Notes (Addendum)
Progress note   [name] ZOX:[id number] DOB: 09/07/1946 DOA: 01/09/2015 PCP: No PCP Per Patient  Admit HPI / Brief Narrative: 69 year old male with a hx of VF arrest 2nd to MI in 10/2014 (taken to cath lab w/ PCI to occluded RCA - intubated on hypothermia protocol for several days w/ resultant encephalopathy and cardiomyopathy). On 1/18 he presented to [facility] after a witnessed arrest. 15 mins downtime prior to EMS arrival. ACLS was started. Initial rythym was VF. ROSC was achieved after 10 minutes ACLS with defibrillation x 4. Total downtime ~20 mins. He was intubated in ED., self extubated on 1/20. He was transferred to telemetry on 1/26.  Significant events: 11/5 - 11/14 > admission for VF arrest, underwent hypothermia and PCI to RCA 1/18 CVL left IF > 1/18 A-Line left Radial > 1/18 EEG normal 1/19 Repeat ECHO 25-30% with inferior wall hypokinesis. Unchanged from 10/2014 1/20 Self extubated 1/20 developed Afib with RVR > started amio, coreg, diltiazem and digoxin 1/22 LHC > high grade RCA disease, large inferobasilar aneursysm LV, widely patent left coronary artery system, LVEF 25%  HPI/Subjective: Pt has no new complaints today.  He denies cp, n/v, or sob.  He states his appetite is improving.  He requested to speak with Child psychotherapist.   Assessment/Plan:  Acute hypoxemic respiratory failure due to ACS - resolved  Suspected RLL HCAP Cont empiric abx tx to complete a 7 day course , 2 more days of cefepime.   VF arrest Per Cardiology / EP - remains at risk for recurrent sudden death - not a candidate for ICD - EP suggests amio  BID for a month, and upward titration of BB - to f/u w/ EP as outpt 4 weeks post d/c   Ischemic cardiomyopathy / chronic systolic CHF - LVEF 25% Clinically appears well compensated at present - begin PT/OT. Social worker consulted.   CAD w/ persistent area of RCA ischemia  LHC without significant change in coronary disease since  10/2014  Hypertensive Emergency Resolved - BP now well controlled   Mild hypokalemia  Mg ok - K+ not yet at goal - cont to replace and follow    Atrial fibrillation with RVR Not a candidate for long term anticoag due to compliance and substance abuse - NSR at present   E coli UTI  Should be more than adequately covered w/ his current abx tx   Lactic acidosis  Due to arrest - resolved   Chronic anemia Follow  Cocaine abuse  UDS negative this admit   Homelessness  Code Status: FULL Family Communication: no family present at time of exam Disposition Plan: stable for transfer to tele bed - begin PT/OT   Consultants: [facility] Cards EP   Antibiotics: Cefepime 1/22 > Vanc 1/22 >1/24  DVT prophylaxis: lovenox  Objective: Blood pressure 98/55, pulse 80, temperature 99.2 F (37.3 C), temperature source Oral, resp. rate 18, height  (1.88 m), weight 68.2 kg (150 lb 5.7 oz), SpO2 97 %.  Intake/Output Summary (Last 24 hours) at 01/17/15 1510 Last data filed at 01/17/15 1300  Gross per 24 hour  Intake    578 ml  Output   2276 ml  Net  -1698 ml   Exam: General: No acute respiratory distress - thin / cachectic  Lungs: Clear to auscultation bilaterally without wheezes or crackles Cardiovascular: Regular rate and rhythm without murmur gallop or rub Abdomen: Nontender, nondistended, soft, bowel sounds positive, no rebound, no ascites, no appreciable mass Extremities: No  significant cyanosis, clubbing, or edema bilateral lower extremities  Data Reviewed: Basic Metabolic Panel:  Recent Labs Lab 01/11/15 0600 01/12/15 0440 01/13/15 0525 01/14/15 0405 01/15/15 0415 01/16/15 0533 01/17/15 0525  NA 145 145 139 140 140 142 141  K 4.2 4.3 3.6 3.5 3.3* 3.3* 3.8  CL 110 111 107 107 105 107 105  CO2 22 24 25 23 27 29 27   GLUCOSE 100* 77 107* 107* 120* 111* 106*  BUN 16 17 17 14 14 10 11   CREATININE 1.00 1.07 1.19 1.02 1.00 0.91 0.95  CALCIUM 9.1 8.9 8.6 8.9 8.8 8.7 8.7   MG 1.7 2.2  --   --   --  1.9  --   PHOS 5.5* 3.6  --   --   --   --   --     Liver Function Tests:  Recent Labs Lab 01/12/15 0440 01/16/15 0533  AST 43* 23  ALT 29 22  ALKPHOS 98 80  BILITOT 0.6 0.7  PROT 7.0 6.2  ALBUMIN 2.5* 2.2*   Coags: No results for input(s): INR in the last 168 hours.  Invalid input(s): PT No results for input(s): APTT in the last 168 hours.  CBC:  Recent Labs Lab 01/12/15 0835 01/13/15 0525 01/14/15 0405 01/15/15 0415 01/16/15 0533  WBC 24.3* 15.4* 11.8* 9.5 10.0  HGB 12.4* 11.2* 10.2* 10.5* 11.0*  HCT 38.4* 33.6* 31.1* 31.7* 34.1*  MCV 85.1 85.1 82.9 82.6 82.8  PLT 353 306 297 311 362    Cardiac Enzymes: No results for input(s): CKTOTAL, CKMB, CKMBINDEX, TROPONINI in the last 168 hours. CBG:  Recent Labs Lab 01/10/15 2039 01/11/15 0039 01/11/15 0430 01/11/15 0725 01/11/15 1142  GLUCAP 91 91 91 89 90    Recent Results (from the past 240 hour(s))  MRSA PCR Screening     Status: None   Collection Time: 01/09/15  4:50 PM  Result Value Ref Range Status   MRSA by PCR NEGATIVE NEGATIVE Final    Comment:        The GeneXpert MRSA Assay (FDA approved for NASAL specimens only), is one component of a comprehensive MRSA colonization surveillance program. It is not intended to diagnose MRSA infection nor to guide or monitor treatment for MRSA infections.   Culture, blood (routine x 2)     Status: None (Preliminary result)   Collection Time: 01/12/15  8:20 AM  Result Value Ref Range Status   Specimen Description BLOOD LEFT ANTECUBITAL  Final   Special Requests BOTTLES DRAWN AEROBIC ONLY 4.0CCS  Final   Culture   Final           BLOOD CULTURE RECEIVED NO GROWTH TO DATE CULTURE WILL BE HELD FOR 5 DAYS BEFORE ISSUING A FINAL NEGATIVE REPORT Performed at [facility]    Report Status PENDING  Incomplete  Culture, blood (routine x 2)     Status: None (Preliminary result)   Collection Time: 01/12/15  8:25 AM  Result  Value Ref Range Status   Specimen Description BLOOD RIGHT HAND  Final   Special Requests BOTTLES DRAWN AEROBIC ONLY 1.5CCS  Final   Culture   Final           BLOOD CULTURE RECEIVED NO GROWTH TO DATE CULTURE WILL BE HELD FOR 5 DAYS BEFORE ISSUING A FINAL NEGATIVE REPORT Note: Culture results may be compromised due to an inadequate volume of blood received in culture bottles. Performed at [facility]    Report Status PENDING  Incomplete  Urine culture     Status: None   Collection Time: 01/12/15  1:09 PM  Result Value Ref Range Status   Specimen Description URINE, CATHETERIZED  Final   Special Requests NONE  Final   Colony Count   Final    95,000 COLONIES/ML Performed at [facility]    Culture   Final    ESCHERICHIA COLI Performed at [facility]    Report Status 01/15/2015 FINAL  Final   Organism ID, Bacteria ESCHERICHIA COLI  Final      Susceptibility   Escherichia coli - MIC*    AMPICILLIN >=32 RESISTANT Resistant     CEFAZOLIN <=4 SENSITIVE Sensitive     CEFTRIAXONE <=1 SENSITIVE Sensitive     CIPROFLOXACIN <=0.25 SENSITIVE Sensitive     GENTAMICIN <=1 SENSITIVE Sensitive     LEVOFLOXACIN <=0.12 SENSITIVE Sensitive     NITROFURANTOIN <=16 SENSITIVE Sensitive     TOBRAMYCIN <=1 SENSITIVE Sensitive     TRIMETH/SULFA <=20 SENSITIVE Sensitive     PIP/TAZO <=4 SENSITIVE Sensitive     * ESCHERICHIA COLI     Studies:  Recent x-ray studies have been reviewed in detail by the Attending Physician  Scheduled Meds:  Scheduled Meds: . amiodarone  400 mg Oral BID  . aspirin  81 mg Oral Daily  . atorvastatin  80 mg Oral q1800  . carvedilol  12.5 mg Oral BID WC  . ceFEPime (MAXIPIME) IV  1 g Intravenous 3 times per day  . enoxaparin (LOVENOX) injection  40 mg Subcutaneous Q24H  . feeding supplement (ENSURE COMPLETE)  237 mL Oral BID BM  . potassium chloride  40 mEq Oral BID    Time spent on care of this patient: 15 mins   Ivette Castronova , MD    [facility] Office  [phone] Pager - Text Page per Loretha Stapler as per below:  On-Call/Text Page:      [email or web address]      password [email or web address]  If 7PM-7AM, please contact night-coverage [email or web address] Password [email or web address] 01/17/2015, 3:10 PM   LOS: 8 days

## 2015-01-17 NOTE — Clinical Social Work Note (Addendum)
1:30pm CSW was informed by Dr that wished to see CSW again.  CSW met with patient who states that his housemate thought the pt had died and had therefore given away his room to another tenant.  Patient states that he now has no where to go but when asked he stated that he would likely be able to stay with his friend for a few days while he figured things out.    Patient states that he has a son who lives locally who was supposed to bring the pt his phone and clothing but who has not been to the hospital- pt states that he does not remember his sons number after the heart attach.  Pt also states that he has a daughter who lives locally- pt unsure if living with dtr is option.  CSW provided pt with numbers from his record so that he could contact family members.  Patient would like CSW aid in contacting his DSS Worker who is trying to help him with food stamps 10:20pm CSW informed in report that patient might have issues with homelessness. CSW met with patient to assess.  Patient became very agitated with CSW and stated that he has a house and a house mate.   Domenica Reamer, [place] Social Worker [phone]

## 2015-01-17 NOTE — Evaluation (Signed)
Occupational Therapy Evaluation Patient Details Name: [name] MRN: [id number] DOB: May 08, 1946 Today's Date: 01/17/2015    History of Present Illness 69 year old male with a hx of VF arrest 2nd to MI in 10/2014 (taken to cath lab w/ PCI to occluded RCA - intubated on hypothermia protocol for several days w/ resultant encephalopathy and cardiomyopathy). On 1/18 he presented to [facility] after a witnessed arrest. ETT 1/18-1/20 self extubated. Cocaine abuse, Afib   Clinical Impression   Pt was performing ADL independent to modified independent prior to admission.  Presents with balance deficits and is a fall risk.  He states he is functioning at his baseline without concern for safety.  Will follow to address ADL transfers.      Follow Up Recommendations  No OT follow up    Equipment Recommendations       Recommendations for Other Services       Precautions / Restrictions Precautions Precautions: Fall Restrictions Weight Bearing Restrictions: No      Mobility Bed Mobility               General bed mobility comments: pt up in chair  Transfers Overall transfer level: Modified independent Equipment used: None                  Balance     Sitting balance-Leahy Scale: Good       Standing balance-Leahy Scale: Good                              ADL Overall ADL's : Needs assistance/impaired Eating/Feeding: Independent;Sitting   Grooming: Wash/dry hands;Standing;Supervision/safety   Upper Body Bathing: Set up;Sitting   Lower Body Bathing: Sit to/from stand;Modified independent   Upper Body Dressing : Set up;Sitting   Lower Body Dressing: Sit to/from stand;Modified independent Lower Body Dressing Details (indicate cue type and reason): leans over to donn socks Toilet Transfer: Min guard;Ambulation;Regular Toilet;Grab bars   Toileting- Clothing Manipulation and Hygiene: Min guard;Sit to/from stand       Functional mobility during ADLs: Min  guard General ADL Comments: Pt stabilizes in standing by bracing legs against chair or with UEs supported, likely his baseline. Pt declining use of ambulation device. Claims he walks better with his shoes on, but no shoes in room.     Vision                     Perception     Praxis      Pertinent Vitals/Pain Pain Assessment: No/denies pain     Hand Dominance Right   Extremity/Trunk Assessment Upper Extremity Assessment Upper Extremity Assessment: Overall WFL for tasks assessed   Lower Extremity Assessment Lower Extremity Assessment: Defer to PT evaluation       Communication Communication Communication: No difficulties   Cognition Arousal/Alertness: Awake/alert Behavior During Therapy: WFL for tasks assessed/performed Overall Cognitive Status: No family/caregiver present to determine baseline cognitive functioning Area of Impairment: Safety/judgement         Safety/Judgement: Decreased awareness of deficits;Decreased awareness of safety         General Comments       Exercises       Shoulder Instructions      Home Living Family/patient expects to be discharged to:: Private residence Living Arrangements: Non-relatives/Friends Available Help at Discharge: Friend(s) Type of Home: House Home Access: Stairs to enter [employer] of Steps: 3 Entrance Stairs-Rails: None Home Layout: One  level     Bathroom Shower/Tub: Producer, television/film/videoWalk-in shower   Bathroom Toilet: Standard     Home Equipment: Environmental consultantWalker - 2 wheels          Prior Functioning/Environment Level of Independence: Independent             OT Diagnosis: Generalized weakness;Cognitive deficits   OT Problem List: Impaired balance (sitting and/or standing);Decreased safety awareness   OT Treatment/Interventions: Self-care/ADL training;Patient/family education    OT Goals(Current goals can be found in the care plan section) Acute Rehab OT Goals Patient Stated Goal: go home OT Goal  Formulation: With patient Time For Goal Achievement: 01/24/15 Potential to Achieve Goals: Good ADL Goals Pt Will Transfer to Toilet: with modified independence;ambulating;regular height toilet Pt Will Perform Toileting - Clothing Manipulation and hygiene: with modified independence;sit to/from stand Pt Will Perform Tub/Shower Transfer: with modified independence;ambulating  OT Frequency: Min 2X/week   Barriers to D/C:            Co-evaluation              End of Session    Activity Tolerance: Patient tolerated treatment well Patient left: in chair;with call bell/phone within reach   Time: [id number] OT Time Calculation (min): 27 min Charges:  OT General Charges $OT Visit: 1 Procedure OT Evaluation $Initial OT Evaluation Tier I: 1 Procedure OT Treatments $Self Care/Home Management : 8-22 mins G-Codes:    Evern BioMayberry, Toye Rouillard Lynn 01/17/2015, 2:08 PM  [phone]

## 2015-01-18 LAB — CULTURE, BLOOD (ROUTINE X 2)
CULTURE: NO GROWTH
Culture: NO GROWTH

## 2015-01-18 MED ORDER — AMIODARONE HCL 400 MG PO TABS: 400.0000 mg | ORAL_TABLET | Freq: Two times a day (BID) | ORAL | Status: AC

## 2015-01-18 MED ORDER — ATORVASTATIN CALCIUM 80 MG PO TABS: 80.0000 mg | ORAL_TABLET | Freq: Every day | ORAL | Status: AC

## 2015-01-18 MED ORDER — ASPIRIN 81 MG PO CHEW: 81.0000 mg | CHEWABLE_TABLET | Freq: Every day | ORAL | Status: AC

## 2015-01-18 MED ORDER — ENSURE COMPLETE PO LIQD: 237.0000 mL | Freq: Two times a day (BID) | ORAL | Status: AC

## 2015-01-18 MED ORDER — CARVEDILOL 12.5 MG PO TABS: 12.5000 mg | ORAL_TABLET | Freq: Two times a day (BID) | ORAL | Status: AC

## 2015-01-18 MED ORDER — CLOPIDOGREL BISULFATE 75 MG PO TABS: 75.0000 mg | ORAL_TABLET | Freq: Every day | ORAL | Status: AC

## 2015-01-18 NOTE — Progress Notes (Signed)
MD paged per pt request, cannot get scripts at the [facility] that his medications were sent to and he would like them to be changed to a different pharmacy. Darrel HooverWilson,Ranya Fiddler S 2:38 PM

## 2015-01-18 NOTE — Progress Notes (Signed)
Per verbal order dc tele and kvo per Dr. Catha GosselinMikhail. Louie BunWilson,Kimmora Risenhoover S 8:07 AM

## 2015-01-18 NOTE — Discharge Summary (Signed)
Physician Discharge Summary  Scott EstimableJames [name] ZOX:[id number] DOB: [birth year] DOA: 01/09/2015  PCP: No PCP Per Patient  Admit date: 01/09/2015 Discharge date: 01/18/2015  Time spent: 45 minutes  Recommendations for Outpatient Follow-up:  Patient will be discharged to home with outpatient PT.  He is to follow-up with his cardioloigst, [name], as well as his primary care physician within 1 week of discharge. Patient is to continue taking his medications as prescribed.  He is to follow a heart healthy diet.    Discharge Diagnoses:  Active Problems:   Cardiac arrest   Malnutrition of moderate degree   Acute respiratory failure with hypoxia   VF (ventricular fibrillation)   CAD in native artery   Drug Abuse  Discharge Condition: Stable  Diet recommendation: heart healty  Filed Weights   01/10/15 0600 01/11/15 0400 01/13/15 1400  Weight: 67.11 kg (147 lb 15.2 oz) 69.81 kg (153 lb 14.5 oz) 68.2 kg (150 lb 5.7 oz)    History of present illness:  By Dr. Koren BoundWesam Bryant on 01/09/2015 69 year old male with PMH as below, which includes VF arrest 2nd to MI in 10/2014. He was taken to cath lab and underwent PCI to occluded RCA. He was intubated in ICU on hypothermia protocol for several days. Resultant encephalopathy and cardiomyopathy. 1/18 [facility] after a witnessed arrest. 15 mins downtime prior to EMS arrival. ACLS was started. Initial rythym was VF. ROSC was achieved after 10 minutes ACLS with defibrillation x 4. Sinus tach. He was intubated in ED. PCCM to admit.   Hospital Course:  Cardiac Arrest/VF/ CAD/ischemic cardiomyopathy (EF 25%) -Likely secondary to chronic cocaine abuse  -Initially admitted to ICU and underwent ACLS -Cardiology consulted appreciated -Spoke with [name] via phone and per Dr. Harvie BridgeNahser's PN, patient is not a defibrillator candidate -Patient has known CAD however, RCA could not be stented, patient also not compliant with medications -[name] has agreed  to see him as an outpatient if patient will followup with him.  Will also place patient on Plavix, coreg (although patient has been cocaine + in the past) -Continue amiodarone  Acute respiratory failure with hypoxia -Patient did require intubation, however successfully extubated  Suspected RLL HCAP -Completed 7 days course of antibiotics  Hypertensive Emergency  -Resolved  Hypokalemia -Replaced  Atrial fibrillation with RVR -Not a candidiate for long term anticoagulation due to noncompliance and substance abuse -Currently rate and rhythm controlled -Continue amiodarone  Ecoli UTI -Completed antiobiotic course  Lactic acidosis  -Likely secondary to above -Resolved  Cocaine Abuse  -UDS negative this admission -Patient counseled  Moderate malnutrition -Continue nutrition supplement  Procedures/Significant Events: 11/5 - 11/14 > admission for VF arrest, underwent hypothermia and PCI to RCA 1/18 CVL left IF > 1/18 A-Line left Radial > 1/18 EEG normal 1/19 Repeat ECHO 25-30% with inferior wall hypokinesis. Unchanged from 10/2014 1/20 Self extubated 1/20 developed Afib with RVR > started amio, coreg, diltiazem and digoxin 1/22 LHC > high grade RCA disease, large inferobasilar aneursysm LV, widely patent left coronary artery system, LVEF 25%  Consultations: Cardiology Neurology PCCM  Discharge Exam: Filed Vitals:   01/18/15 0352  BP: 115/67  Pulse: 78  Temp: 99.1 F (37.3 C)  Resp: 18     General: Well developed, NAD, appears stated age  HEENT: NCAT, mucous membranes moist.  Cardiovascular: S1 S2 auscultated, no rubs, murmurs or gallops. Regular rate and rhythm.  Respiratory: Clear to auscultation bilaterally with equal chest rise  Abdomen: Soft, nontender,  nondistended, + bowel sounds  Extremities: warm dry without cyanosis clubbing or edema  Neuro: AAOx3, nonfocal  Psych: Appropriate  Discharge Instructions      Discharge Instructions     Discharge instructions    Complete by:  As directed   Patient will be discharged to home with outpatient PT.  He is to follow-up with his cardioloigst, Dr. Eden Emms, as well as his primary care physician within 1 week of discharge. Patient is to continue taking his medications as prescribed.  He is to follow a heart healthy diet.            Medication List    STOP taking these medications        clonazePAM 0.5 MG tablet  Commonly known as:  KLONOPIN     divalproex 500 MG 24 hr tablet  Commonly known as:  DEPAKOTE ER     ibuprofen 200 MG tablet  Commonly known as:  ADVIL,MOTRIN     ipratropium-albuterol 0.5-2.5 (3) MG/3ML Soln  Commonly known as:  DUONEB     lisinopril 2.5 MG tablet  Commonly known as:  PRINIVIL,ZESTRIL     pantoprazole 40 MG tablet  Commonly known as:  PROTONIX     QUEtiapine 25 MG tablet  Commonly known as:  SEROQUEL     ticagrelor 90 MG Tabs tablet  Commonly known as:  BRILINTA     ziprasidone injection  Commonly known as:  GEODON      TAKE these medications        amiodarone 400 MG tablet  Commonly known as:  PACERONE  Take 1 tablet (400 mg total) by mouth 2 (two) times daily.     aspirin 81 MG chewable tablet  Chew 1 tablet (81 mg total) by mouth daily.     atorvastatin 80 MG tablet  Commonly known as:  LIPITOR  Take 1 tablet (80 mg total) by mouth daily at 6 PM.     carvedilol 12.5 MG tablet  Commonly known as:  COREG  Take 1 tablet (12.5 mg total) by mouth 2 (two) times daily with a meal.     clopidogrel 75 MG tablet  Commonly known as:  PLAVIX  Take 1 tablet (75 mg total) by mouth daily.     feeding supplement (ENSURE COMPLETE) Liqd  Take 237 mLs by mouth 2 (two) times daily between meals.       Allergies  Allergen Reactions  . Aspirin Nausea And Vomiting   Follow-up Information    Follow up with Diamantina Providence., FNP On 01/26/2015.   Specialty:  Nurse Practitioner   Why:  f/u at the [facility] at  2:50   Contact information:   [address] [place] [address] [phone]       Follow up with [facility]   Specialty:  Rehabilitation   Why:  referral sent for oupt PT- they will contact you   Contact information:   [address] [id number] mc [place] [address] [phone]      Follow up with Charlton Haws, MD. Schedule an appointment as soon as possible for a visit in 2 months.   Specialty:  Cardiology   Why:  Hospital followup   Contact information:   [address] [place] [address] [phone]        The results of significant diagnostics from this hospitalization (including imaging, microbiology, ancillary and laboratory) are listed below for reference.  Significant Diagnostic Studies: Ct Head Wo Contrast  01/09/2015   CLINICAL DATA:  Initial encounter for cardiac arrest.  EXAM: CT HEAD WITHOUT CONTRAST  CT CERVICAL SPINE WITHOUT CONTRAST  TECHNIQUE: Multidetector CT imaging of the head and cervical spine was performed following the standard protocol without intravenous contrast. Multiplanar CT image reconstructions of the cervical spine were also generated.  COMPARISON:  None.  FINDINGS: CT HEAD FINDINGS  Sinuses/Soft tissues: Minimal mucosal thickening of ethmoid air cells and left sphenoid sinus. Clear mastoid air cells.  Intracranial: No mass lesion, hemorrhage, hydrocephalus, acute infarct, intra-axial, or extra-axial fluid collection.  CT CERVICAL SPINE FINDINGS  Spinal visualization through the bottom of T2. Prevertebral soft tissues not well evaluated secondary to endotracheal and nasogastric tubes. Mild loss of vertebral body height at C4 through C6. Likely within normal variation/chronic. Secretions in the nasopharynx and oral pharynx are also likely related to endotracheal tube in place. No apical pneumothorax. Biapical centrilobular and paraseptal emphysema.   Advanced cervical spondylosis. This results in areas of the central canal stenosis at C4-5 through C6-7. Bilateral neural foraminal narrowing at C5-6 and C6-7. Straightening of expected lordosis.  Skull base intact. Facets are well-aligned. Coronal reformats demonstrate only degenerative irregularity about the C1-2 articulation.  IMPRESSION: 1.  No acute intracranial abnormality. 2. Advanced cervical spondylosis, without acute finding. 3. Degraded evaluation for prevertebral soft tissue swelling, secondary to endotracheal tube in place. 4. Straightening of expected cervical lordosis could be positional, due to muscular spasm, or ligamentous injury. 5. Sinus disease   Electronically Signed   By: Jeronimo Greaves M.D.   On: 01/09/2015 15:03   Ct Cervical Spine Wo Contrast  01/09/2015   CLINICAL DATA:  Initial encounter for cardiac arrest.  EXAM: CT HEAD WITHOUT CONTRAST  CT CERVICAL SPINE WITHOUT CONTRAST  TECHNIQUE: Multidetector CT imaging of the head and cervical spine was performed following the standard protocol without intravenous contrast. Multiplanar CT image reconstructions of the cervical spine were also generated.  COMPARISON:  None.  FINDINGS: CT HEAD FINDINGS  Sinuses/Soft tissues: Minimal mucosal thickening of ethmoid air cells and left sphenoid sinus. Clear mastoid air cells.  Intracranial: No mass lesion, hemorrhage, hydrocephalus, acute infarct, intra-axial, or extra-axial fluid collection.  CT CERVICAL SPINE FINDINGS  Spinal visualization through the bottom of T2. Prevertebral soft tissues not well evaluated secondary to endotracheal and nasogastric tubes. Mild loss of vertebral body height at C4 through C6. Likely within normal variation/chronic. Secretions in the nasopharynx and oral pharynx are also likely related to endotracheal tube in place. No apical pneumothorax. Biapical centrilobular and paraseptal emphysema.  Advanced cervical spondylosis. This results in areas of the central canal stenosis  at C4-5 through C6-7. Bilateral neural foraminal narrowing at C5-6 and C6-7. Straightening of expected lordosis.  Skull base intact. Facets are well-aligned. Coronal reformats demonstrate only degenerative irregularity about the C1-2 articulation.  IMPRESSION: 1.  No acute intracranial abnormality. 2. Advanced cervical spondylosis, without acute finding. 3. Degraded evaluation for prevertebral soft tissue swelling, secondary to endotracheal tube in place. 4. Straightening of expected cervical lordosis could be positional, due to muscular spasm, or ligamentous injury. 5. Sinus disease   Electronically Signed   By: Jeronimo Greaves M.D.   On: 01/09/2015 15:03   Dg Chest Port 1 View  01/14/2015   CLINICAL DATA:  Possible acquired pneumonia  EXAM: PORTABLE CHEST - 1 VIEW  COMPARISON:  01/12/2015  FINDINGS: Cardiac shadow remains enlarged. Bibasilar infiltrative changes are again noted. AV right jugular central line is  again seen and stable. No acute abnormality is noted.  IMPRESSION: Stable bibasilar changes   Electronically Signed   By: Alcide Clever M.D.   On: 01/14/2015 07:16   Dg Chest Port 1 View  01/12/2015   CLINICAL DATA:  Shortness of breath and respiratory failure  EXAM: PORTABLE CHEST - 1 VIEW  COMPARISON:  01/11/2015  FINDINGS: Cardiac shadow is stable. The endotracheal tube and nasogastric catheter have been removed in the interval. A right jugular line is again seen and stable. Bibasilar atelectatic changes are noted slightly worse on the right than the left.  IMPRESSION: New bibasilar atelectatic changes   Electronically Signed   By: Alcide Clever M.D.   On: 01/12/2015 08:09   Dg Chest Port 1 View  01/11/2015   CLINICAL DATA:  Check endotracheal tube placement  EXAM: PORTABLE CHEST - 1 VIEW  COMPARISON:  01/10/2015  FINDINGS: The endotracheal tube is noted 6 cm above the carina and stable. A right jugular line is seen just above the cavoatrial junction. A nasogastric catheter courses towards the  stomach. The cardiac shadow is stable. The lungs are well aerated bilaterally. No acute bony abnormality is seen.  IMPRESSION: Tubes and lines stable in appearance.  No acute abnormality is seen.   Electronically Signed   By: Alcide Clever M.D.   On: 01/11/2015 07:24   Dg Chest Port 1 View  01/10/2015   CLINICAL DATA:  Hypoxia  EXAM: PORTABLE CHEST - 1 VIEW  COMPARISON:  January 09, 2015  FINDINGS: Endotracheal tube tip is 5.1 cm above the carina. Central catheter tip is in the superior vena cava. Nasogastric tube tip and side port are below the diaphragm. No pneumothorax. There is no edema or consolidation. Heart size and pulmonary vascularity are within normal limits. No adenopathy. No bone lesions.  IMPRESSION: Tube and catheter positions as described without pneumothorax. No edema or consolidation.   Electronically Signed   By: Bretta Bang M.D.   On: 01/10/2015 07:17   Dg Chest Port 1 View  01/09/2015   CLINICAL DATA:  PICC/central line placement.  Shortness of breath.  EXAM: PORTABLE CHEST - 1 VIEW  COMPARISON:  01/09/2015 and 07/20/2009.  FINDINGS: Endotracheal tube with tip 7.6 cm above the carina. Enteric tube courses into the region of the stomach and off the film as tip is not visualized. Right IJ central venous catheter has tip over the SVC. External defibrillator over the left chest. Lungs are adequately inflated without consolidation or effusion. Minimal stable prominence of the central vascular markings. Cardiomediastinal silhouette and remainder the exam is unchanged. There is no evidence of right-sided pneumothorax.  IMPRESSION: Suggestion mild vascular congestion.  Tubes and lines as described.   Electronically Signed   By: Elberta Fortis M.D.   On: 01/09/2015 15:44   Dg Chest Portable 1 View  01/09/2015   CLINICAL DATA:  CPR  EXAM: PORTABLE CHEST - 1 VIEW  COMPARISON:  07/20/2009  FINDINGS: Normal heart size. Endotracheal tube is 6.2 cm from the carina. NG tube is in the fundus of the  stomach. Clear lungs. No pneumothorax. No pleural effusion. Normal vascularity.  IMPRESSION: Endotracheal and NG tubes as described.  Clear lungs.   Electronically Signed   By: Maryclare Bean M.D.   On: 01/09/2015 14:43    Microbiology: Recent Results (from the past 240 hour(s))  MRSA PCR Screening     Status: None   Collection Time: 01/09/15  4:50 PM  Result Value Ref Range Status  MRSA by PCR NEGATIVE NEGATIVE Final    Comment:        The GeneXpert MRSA Assay (FDA approved for NASAL specimens only), is one component of a comprehensive MRSA colonization surveillance program. It is not intended to diagnose MRSA infection nor to guide or monitor treatment for MRSA infections.   Culture, blood (routine x 2)     Status: None   Collection Time: 01/12/15  8:20 AM  Result Value Ref Range Status   Specimen Description BLOOD LEFT ANTECUBITAL  Final   Special Requests BOTTLES DRAWN AEROBIC ONLY 4.0CCS  Final   Culture   Final    NO GROWTH 5 DAYS Performed at [facility]    Report Status 01/18/2015 FINAL  Final  Culture, blood (routine x 2)     Status: None   Collection Time: 01/12/15  8:25 AM  Result Value Ref Range Status   Specimen Description BLOOD RIGHT HAND  Final   Special Requests BOTTLES DRAWN AEROBIC ONLY 1.5CCS  Final   Culture   Final    NO GROWTH 5 DAYS Note: Culture results may be compromised due to an inadequate volume of blood received in culture bottles. Performed at [facility]    Report Status 01/18/2015 FINAL  Final  Urine culture     Status: None   Collection Time: 01/12/15  1:09 PM  Result Value Ref Range Status   Specimen Description URINE, CATHETERIZED  Final   Special Requests NONE  Final   Colony Count   Final    95,000 COLONIES/ML Performed at [facility]    Culture   Final    ESCHERICHIA COLI Performed at [facility]    Report Status 01/15/2015 FINAL  Final   Organism ID, Bacteria ESCHERICHIA COLI  Final       Susceptibility   Escherichia coli - MIC*    AMPICILLIN >=32 RESISTANT Resistant     CEFAZOLIN <=4 SENSITIVE Sensitive     CEFTRIAXONE <=1 SENSITIVE Sensitive     CIPROFLOXACIN <=0.25 SENSITIVE Sensitive     GENTAMICIN <=1 SENSITIVE Sensitive     LEVOFLOXACIN <=0.12 SENSITIVE Sensitive     NITROFURANTOIN <=16 SENSITIVE Sensitive     TOBRAMYCIN <=1 SENSITIVE Sensitive     TRIMETH/SULFA <=20 SENSITIVE Sensitive     PIP/TAZO <=4 SENSITIVE Sensitive     * ESCHERICHIA COLI     Labs: Basic Metabolic Panel:  Recent Labs Lab 01/12/15 0440 01/13/15 0525 01/14/15 0405 01/15/15 0415 01/16/15 0533 01/17/15 0525  NA 145 139 140 140 142 141  K 4.3 3.6 3.5 3.3* 3.3* 3.8  CL 111 107 107 105 107 105  CO2 24 25 23 27 29 27   GLUCOSE 77 107* 107* 120* 111* 106*  BUN 17 17 14 14 10 11   CREATININE 1.07 1.19 1.02 1.00 0.91 0.95  CALCIUM 8.9 8.6 8.9 8.8 8.7 8.7  MG 2.2  --   --   --  1.9  --   PHOS 3.6  --   --   --   --   --    Liver Function Tests:  Recent Labs Lab 01/12/15 0440 01/16/15 0533  AST 43* 23  ALT 29 22  ALKPHOS 98 80  BILITOT 0.6 0.7  PROT 7.0 6.2  ALBUMIN 2.5* 2.2*   No results for input(s): LIPASE, AMYLASE in the last 168 hours. No results for input(s): AMMONIA in the last 168 hours. CBC:  Recent Labs Lab 01/12/15 0835 01/13/15 0525 01/14/15 0405  01/15/15 0415 01/16/15 0533  WBC 24.3* 15.4* 11.8* 9.5 10.0  HGB 12.4* 11.2* 10.2* 10.5* 11.0*  HCT 38.4* 33.6* 31.1* 31.7* 34.1*  MCV 85.1 85.1 82.9 82.6 82.8  PLT 353 306 297 311 362   Cardiac Enzymes: No results for input(s): CKTOTAL, CKMB, CKMBINDEX, TROPONINI in the last 168 hours. BNP: BNP (last 3 results) No results for input(s): PROBNP in the last 8760 hours. CBG: No results for input(s): GLUCAP in the last 168 hours.     SignedEdsel Petrin  [facility] 01/18/2015, 12:10 PM

## 2015-01-18 NOTE — Progress Notes (Signed)
Pt discharged home Discharge instructions given & reviewed Education discussed  IV dc'd  Tele dc'd  Pt discharged via wheelchair with NT Pt belongs at side.  Louie BunWilson,Emira Eubanks S 2:50 PM

## 2015-01-18 NOTE — Discharge Instructions (Signed)
Polysubstance Abuse °When people abuse more than one drug or type of drug it is called polysubstance or polydrug abuse. For example, many smokers also drink alcohol. This is one form of polydrug abuse. Polydrug abuse also refers to the use of a drug to counteract an unpleasant effect produced by another drug. It may also be used to help with withdrawal from another drug. People who take stimulants may become agitated. Sometimes this agitation is countered with a tranquilizer. This helps protect against the unpleasant side effects. Polydrug abuse also refers to the use of different drugs at the same time.  °Anytime drug use is interfering with normal living activities, it has become abuse. This includes problems with family and friends. Psychological dependence has developed when your mind tells you that the drug is needed. This is usually followed by physical dependence which has developed when continuing increases of drug are required to get the same feeling or "high". This is known as addiction or chemical dependency. A person's risk is much higher if there is a history of chemical dependency in the family. °SIGNS OF CHEMICAL DEPENDENCY °· You have been told by friends or family that drugs have become a problem. °· You fight when using drugs. °· You are having blackouts (not remembering what you do while using). °· You feel sick from using drugs but continue using. °· You lie about use or amounts of drugs (chemicals) used. °· You need chemicals to get you going. °· You are suffering in work performance or in school because of drug use. °· You get sick from use of drugs but continue to use anyway. °· You need drugs to relate to people or feel comfortable in social situations. °· You use drugs to forget problems. °"Yes" answered to any of the above signs of chemical dependency indicates there are problems. The longer the use of drugs continues, the greater the problems will become. °If there is a family history of  drug or alcohol use, it is best not to experiment with these drugs. Continual use leads to tolerance. After tolerance develops more of the drug is needed to get the same feeling. This is followed by addiction. With addiction, drugs become the most important part of life. It becomes more important to take drugs than participate in the other usual activities of life. This includes relating to friends and family. Addiction is followed by dependency. Dependency is a condition where drugs are now needed not just to get high, but to feel normal. °Addiction cannot be cured but it can be stopped. This often requires outside help and the care of professionals. Treatment centers are listed in the yellow pages under: Cocaine, Narcotics, and Alcoholics Anonymous. Most hospitals and clinics can refer you to a specialized care center. Talk to your caregiver if you need help. °Document Released: 07/31/2005 Document Revised: 03/02/2012 Document Reviewed: 12/09/2005 °ExitCare® Patient Information ©2015 [employer]. This information is not intended to replace advice given to you by your health care provider. Make sure you discuss any questions you have with your health care provider. ° °

## 2015-02-03 ENCOUNTER — Ambulatory Visit: Payer: Self-pay | Admitting: Physician Assistant

## 2015-02-09 ENCOUNTER — Encounter: Payer: Self-pay | Admitting: Physician Assistant

## 2015-02-09 ENCOUNTER — Ambulatory Visit (INDEPENDENT_AMBULATORY_CARE_PROVIDER_SITE_OTHER): Payer: Medicare Other | Admitting: Physician Assistant

## 2015-02-09 VITALS — BP 140/81 | HR 61 | Ht 74.0 in | Wt 148.0 lb

## 2015-02-09 DIAGNOSIS — I480 Paroxysmal atrial fibrillation: Secondary | ICD-10-CM | POA: Diagnosis not present

## 2015-02-09 DIAGNOSIS — I255 Ischemic cardiomyopathy: Secondary | ICD-10-CM | POA: Diagnosis not present

## 2015-02-09 DIAGNOSIS — I472 Ventricular tachycardia, unspecified: Secondary | ICD-10-CM

## 2015-02-09 DIAGNOSIS — I5022 Chronic systolic (congestive) heart failure: Secondary | ICD-10-CM | POA: Diagnosis not present

## 2015-02-09 DIAGNOSIS — I2510 Atherosclerotic heart disease of native coronary artery without angina pectoris: Secondary | ICD-10-CM

## 2015-02-09 DIAGNOSIS — R042 Hemoptysis: Secondary | ICD-10-CM | POA: Diagnosis not present

## 2015-02-09 DIAGNOSIS — I10 Essential (primary) hypertension: Secondary | ICD-10-CM

## 2015-02-09 DIAGNOSIS — E785 Hyperlipidemia, unspecified: Secondary | ICD-10-CM

## 2015-02-09 LAB — CBC WITH DIFFERENTIAL/PLATELET
BASOS PCT: 0.5 % (ref 0.0–3.0)
Basophils Absolute: 0 10*3/uL (ref 0.0–0.1)
EOS PCT: 4.8 % (ref 0.0–5.0)
Eosinophils Absolute: 0.3 10*3/uL (ref 0.0–0.7)
HEMATOCRIT: 33.5 % — AB (ref 39.0–52.0)
Hemoglobin: 10.9 g/dL — ABNORMAL LOW (ref 13.0–17.0)
Lymphocytes Relative: 30.1 % (ref 12.0–46.0)
Lymphs Abs: 1.7 10*3/uL (ref 0.7–4.0)
MCHC: 32.8 g/dL (ref 30.0–36.0)
MCV: 82.4 fl (ref 78.0–100.0)
Monocytes Absolute: 0.6 10*3/uL (ref 0.1–1.0)
Monocytes Relative: 10.3 % (ref 3.0–12.0)
NEUTROS PCT: 54.3 % (ref 43.0–77.0)
Neutro Abs: 3.1 10*3/uL (ref 1.4–7.7)
PLATELETS: 360 10*3/uL (ref 150.0–400.0)
RBC: 4.06 Mil/uL — ABNORMAL LOW (ref 4.22–5.81)
RDW: 15.9 % — AB (ref 11.5–15.5)
WBC: 5.6 10*3/uL (ref 4.0–10.5)

## 2015-02-09 LAB — BRAIN NATRIURETIC PEPTIDE: PRO B NATRI PEPTIDE: 362 pg/mL — AB (ref 0.0–100.0)

## 2015-02-09 LAB — BASIC METABOLIC PANEL
BUN: 8 mg/dL (ref 6–23)
CHLORIDE: 103 meq/L (ref 96–112)
CO2: 32 meq/L (ref 19–32)
Calcium: 9.2 mg/dL (ref 8.4–10.5)
Creatinine, Ser: 0.84 mg/dL (ref 0.40–1.50)
GFR: 117.19 mL/min (ref >60.00)
GLUCOSE: 87 mg/dL (ref 70–99)
POTASSIUM: 3.4 meq/L — AB (ref 3.5–5.1)
Sodium: 140 mEq/L (ref 135–145)

## 2015-02-09 MED ORDER — POTASSIUM CHLORIDE ER 10 MEQ PO TBCR
10.0000 meq | EXTENDED_RELEASE_TABLET | Freq: Every day | ORAL | Status: DC
Start: 2015-02-09 — End: 2015-02-10

## 2015-02-09 MED ORDER — FUROSEMIDE 20 MG PO TABS: 20.0000 mg | ORAL_TABLET | Freq: Every day | ORAL | Status: AC

## 2015-02-09 NOTE — Patient Instructions (Signed)
Your physician has recommended you make the following change in your medication:  1) START Lasix 20mg  daily. An Rx has been sent to you pharmacy 2) START Potassium 10 meq daily. An Rx has been sent to you pharmacy  Lab Today: Bmet, Cbc, Bnp  A chest x-ray takes a picture of the organs and structures inside the chest, including the heart, lungs, and blood vessels. This test can show several things, including, whether the heart is enlarges; whether fluid is building up in the lungs; and whether pacemaker / defibrillator leads are still in place.([facility] [address] on the 1st floor, you may walk in you do not need an appointment)  [facility]  Please have your son call our office to confirm what medications you are taking. [phone]   Your physician recommends that you return for lab work in: 1 week ( Bmet)  Your physician recommends that you schedule a follow-up appointment in: 1 week with Dr.Mclean at the CHF clinic

## 2015-02-09 NOTE — Progress Notes (Signed)
Cardiology Office Note   Date:  02/09/2015   ID:  LAREN Bryant, DOB [birth year], MRN [id number]  PCP:  No PCP Per Patient  Cardiologist:  Dr. Marca Ancona   Electrophysiologist:  Dr. Lewayne Bunting   Chief Complaint  Patient presents with  . Congestive Heart Failure  . Cardiac Arrest  . Atrial Fibrillation  . Hospitalization Follow-up     History of Present Illness: [name] is a 69 y.o. male with a hx of CAD, ICM, cocaine abuse, homelessness.  Admitted 10/2014 with OOH VF arrest in setting of Inferior STEMI tx with POBA to mid and distal RCA.  No stent used at that time due to homeless status and questionable adherence with dual antiplatelet Rx.  EF was 20-25%.  His hospitalization was prolonged due to poor mental status. He was seen by psychiatry who noted the patient is unable to make his own medical decision given his significant cocaine abuse and substance induced mood disorder. He was eventually discharged to follow-up with psychiatry as outpatient.   Admitted 1/18-1/27 with OOH VF arrest.  He underwent CPR and defib x 4 prior to ROSC.  UDS neg for cocaine.  He was followed by CCM and placed on cooling protocol.  Repeat Echo demonstrated EF 25-30%.  He had an episode of AF with RVR.  He converted to NSR. He was placed on Amiodaroneps for VT/VF and AFib.  He was not felt to be a good candidate for long term anticoagulation.  LHC was done and demonstrated high grade distal RCA disease.  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  This was felt to be substrate for his arrhythmia and not ACS.  He was seen by Dr. Ladona Ridgel for EP. He did not feel that the patient with a candidate for ICD implantation. He recommended amiodarone and beta blocker. He was supposed to go on amiodarone 400 mg twice a day 1 month.  Since DC he has noted a cough.  This was present in the hospital as well.  It is getting better.  It is worse with lying flat. He notes  some blood tinged sputum and epistaxis.  He denies significant DOE.  He c/o chest pain with coughing only.  He denies orthopnea, PND, edema.  He denies syncope.  He is here alone. His son helps with his medications.  The patient is not certain what he is taking.   Studies/Reports Reviewed Today:  Cardiac cath 01/13/15 LM:  heavily calcified and widely patent. LAD:  heavily calcified, tortuous, with 50% mid stenosis. LCx:  widely patent. RI:  calcified, widely patent.. RCA:  95-99% distal stenosis within an acute bend. Ostial PDA 90% - Both are sites of prior intervention in November 2015. Current appearance is very similar to post PCI appearance. Stenting was not performed before because of tortuosity and heavy vessel calcification preventing stent delivery. 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 CAD (coronary artery disease)     a. LHC in setting of VF arrest 11/15 >> high grade RCA tx with POBA;  b. LHC in setting of VF arrest 1/16:  calc LAD, mLAD 50, calc LM, calc RI, dRCA 95-99, oPDA 90, inf-basal aneurysm, EF 25%, inf wall not viable>>med Rx  . Ischemic cardiomyopathy     a.  echo 1/16:  EF 25-30%, PASP 34 mmHg  . Ventricular fibrillation     a. s/p VF arrest 10/2014 and 12/2014 >> 2/2 inf wall aneurysmal segment (substrate for arrhythmia);  b. Amiodarone started 12/2014;  c. not an ICD candidate  . Chronic systolic CHF (congestive heart failure)   . HTN (hypertension)   . HLD (hyperlipidemia)   . Polysubstance abuse     hx of cocaine  . Homelessness     hx of prior homelessness - now living in housing  . Psychiatric disorder     according to notes; previously on seroquel, depakote  . Atrial fibrillation     paroxysmal; noted during admxn with VF arrest in 1/16 >> not an anticoag candidate;  on Amiodarone    Past Surgical History    Procedure Laterality Date  . Left heart catheterization with coronary angiogram N/A 10/27/2014    Procedure: LEFT HEART CATHETERIZATION WITH CORONARY ANGIOGRAM;  Surgeon: Kathleene Hazel, MD;  Location: [facility] CATH LAB;  Service: Cardiovascular;  Laterality: N/A;  . Left heart catheterization with coronary angiogram N/A 01/13/2015    Procedure: LEFT HEART CATHETERIZATION WITH CORONARY ANGIOGRAM;  Surgeon: Lesleigh Noe, MD;  Location: [facility] CATH LAB;  Service: Cardiovascular;  Laterality: N/A;     Current Outpatient Prescriptions  Medication Sig Dispense Refill  . amiodarone (PACERONE) 400 MG tablet Take 1 tablet (400 mg total) by mouth 2 (two) times daily. 60 tablet 0  . aspirin 81 MG chewable tablet Chew 1 tablet (81 mg total) by mouth daily. 30 tablet 0  . atorvastatin (LIPITOR) 80 MG tablet Take 1 tablet (80 mg total) by mouth daily at 6 PM. 30 tablet 0  . carvedilol (COREG) 12.5 MG tablet Take 1 tablet (12.5 mg total) by mouth 2 (two) times daily with a meal. 60 tablet 0  . clopidogrel (PLAVIX) 75 MG tablet Take 1 tablet (75 mg total) by mouth daily. 30 tablet 0  . feeding supplement, ENSURE COMPLETE, (ENSURE COMPLETE) LIQD Take 237 mLs by mouth 2 (two) times daily between meals.    . GUAIFENESIN AC 100-10 MG/5ML syrup Take 5 mLs by mouth 3 (three) times daily as needed for cough or congestion.   0  . levofloxacin (LEVAQUIN) 750 MG tablet Take 750 mg by mouth daily.  0  . PROAIR HFA 108 (90 BASE) MCG/ACT inhaler Inhale 1-2 puffs into the lungs every 4 (four) hours as needed.   0   No current facility-administered medications for this visit.    Allergies:   Aspirin    Social History:  The patient  reports that he has been smoking Cigarettes.  He has a 35 pack-year smoking history. He does not have any smokeless tobacco history on file. He reports that he uses illicit drugs ("Crack" cocaine).   Family History:  The patient's family history includes Anemia in an other family member;  Arrhythmia in an other family member; Asthma in an other family member; Clotting disorder in an other family member; Fainting in an other family member; Heart attack in an other family member; Heart disease in an other family member; Heart failure in an other family member; Hyperlipidemia in an other family member; Hypertension in his brother and other family members; Stroke in his mother.    ROS:   Please see the history of present illness.   Review of Systems  HENT: Positive for nosebleeds.   Respiratory: Positive for wheezing.   Gastrointestinal: Negative for hematemesis and melena.  Genitourinary: Negative for hematuria.  All other systems reviewed and are negative.     PHYSICAL EXAM: VS:  BP 140/81 mmHg  Pulse 61  Ht 6\' 2"  (1.88 m)  Wt 148 lb (67.132 kg)  BMI 18.99 kg/m2    Wt Readings from Last 3 Encounters:  02/09/15 148 lb (67.132 kg)  01/13/15 150 lb 5.7 oz (68.2 kg)  11/05/14 149 lb 0.5 oz (67.6 kg)     GEN: Well nourished, well developed, in no acute distress HEENT: normal Neck: + JVD, no masses Cardiac:  Normal S1/S2, RRR; no murmur, no rubs or gallops, no edema  Respiratory:  Decreased breath sounds bilaterally, no wheezing, rhonchi or rales. GI: soft, nontender, nondistended, + BS MS: no deformity or atrophy Skin: warm and dry  Neuro:  CNs II-XII intact, Strength and sensation are intact Psych: Normal affect   EKG:  EKG is ordered today.  It demonstrates:   NSR, HR 61, normal axis, inf-post Q waves, no change from prior tracings   Recent Labs: 01/16/2015: ALT 22; Hemoglobin 11.0*; Magnesium 1.9; Platelets 362 01/17/2015: BUN 11; Creatinine 0.95; Potassium 3.8; Sodium 141    Lipid Panel    Component Value Date/Time   CHOL 180 10/29/2014 0542   TRIG 46 10/29/2014 0542   HDL 55 10/29/2014 0542   CHOLHDL 3.3 10/29/2014 0542   VLDL 9 10/29/2014 0542   LDLCALC 116* 10/29/2014 0542      ASSESSMENT AND PLAN:  1.  Ischemic cardiomyopathy:  As noted  above, I cannot confirm his medication adherence.  He should remain on Carvedilol.  His HR would suggest that he is taking it.  He should be on an ACEI or ARB.  The hospital notes indicated he was on it.  But, he was not DC on one.  We will have to make an attempt to start an ACEI in FU.  I have asked for his son to call with confirmation of his home medications. 2.  Chronic systolic heart failure:  I believe his cough is related to volume overload.      -  Start Lasix 20 mg QD and K+ 10 mEq QD.    -  Labs today:  BMET, BNP, CBC.    -  Obtain CXR. 3.  VT (ventricular tachycardia):  As noted, he is not a candidate for ICD.  He was seen by EP in the hospital.  Continue Amiodarone.  Surveillance labs can be arranged at his next visit. 4.  Hemoptysis:  He was treated for pneumonia in the hospital.  I think his cough is related to CHF.  Start Lasix and get CXR as noted above. 5.  PAF (paroxysmal atrial fibrillation):  No recurrence.  He is not an anticoagulation candidate.  Continue Amiodarone. 6.  Coronary artery disease:  No clear angina. He should remain on ASA, Plavix, Lipitor, Coreg.  Eventually add ACEI. 7.  Essential hypertension:  Controlled. 8.  Hyperlipidemia:  Continue statin. 9.  I have referred him to primary care as well.    Current medicines are reviewed at length with the patient today.  The patient does not have concerns regarding medicines.  The following changes have been made:  As above.  Labs/ tests ordered today include:   Orders Placed This Encounter  Procedures  . DG Chest 2 View  . Basic metabolic panel  . Basic metabolic panel  . CBC w/Diff  . B Nat Peptide  . [facility]  . EKG 12-Lead     Disposition:  FU with Dr. Marca Ancona in CHF clinic in 1 week   Signed, Brynda Rim, MHS 02/09/2015 2:24 PM    [facility] [address] [place], [place], [place]  [address] Phone: [phone]; Fax: [phone]

## 2015-02-10 ENCOUNTER — Telehealth: Payer: Self-pay | Admitting: *Deleted

## 2015-02-10 DIAGNOSIS — E876 Hypokalemia: Secondary | ICD-10-CM

## 2015-02-10 DIAGNOSIS — I5042 Chronic combined systolic (congestive) and diastolic (congestive) heart failure: Secondary | ICD-10-CM

## 2015-02-10 DIAGNOSIS — I5022 Chronic systolic (congestive) heart failure: Secondary | ICD-10-CM

## 2015-02-10 MED ORDER — POTASSIUM CHLORIDE ER 10 MEQ PO TBCR: 20.0000 meq | EXTENDED_RELEASE_TABLET | Freq: Every day | ORAL | Status: AC

## 2015-02-10 NOTE — Telephone Encounter (Signed)
s/w pt's son who takes care of pt's medications. Son aware of lab results and to give K+ 40 meq today, then start 2/20 K+ 20 meq daily, though contt. lasix 20 daily. Son verbalized understanding to plan of care.

## 2015-02-17 ENCOUNTER — Ambulatory Visit: Payer: Self-pay | Admitting: Family Medicine

## 2015-02-20 ENCOUNTER — Inpatient Hospital Stay (HOSPITAL_COMMUNITY): Admission: RE | Admit: 2015-02-20 | Payer: Self-pay | Source: Ambulatory Visit

## 2015-03-22 ENCOUNTER — Emergency Department (HOSPITAL_COMMUNITY)
Admission: EM | Admit: 2015-03-22 | Discharge: 2015-03-24 | Disposition: E | Payer: Medicare Other | Attending: Emergency Medicine | Admitting: Emergency Medicine

## 2015-03-22 ENCOUNTER — Encounter (HOSPITAL_COMMUNITY): Payer: Self-pay | Admitting: Physical Medicine and Rehabilitation

## 2015-03-22 DIAGNOSIS — Z720 Tobacco use: Secondary | ICD-10-CM | POA: Diagnosis not present

## 2015-03-22 DIAGNOSIS — Z7982 Long term (current) use of aspirin: Secondary | ICD-10-CM | POA: Insufficient documentation

## 2015-03-22 DIAGNOSIS — Z8659 Personal history of other mental and behavioral disorders: Secondary | ICD-10-CM | POA: Diagnosis not present

## 2015-03-22 DIAGNOSIS — Z7902 Long term (current) use of antithrombotics/antiplatelets: Secondary | ICD-10-CM | POA: Insufficient documentation

## 2015-03-22 DIAGNOSIS — I469 Cardiac arrest, cause unspecified: Secondary | ICD-10-CM | POA: Insufficient documentation

## 2015-03-22 DIAGNOSIS — Z792 Long term (current) use of antibiotics: Secondary | ICD-10-CM | POA: Insufficient documentation

## 2015-03-22 DIAGNOSIS — I10 Essential (primary) hypertension: Secondary | ICD-10-CM | POA: Insufficient documentation

## 2015-03-22 DIAGNOSIS — E785 Hyperlipidemia, unspecified: Secondary | ICD-10-CM | POA: Diagnosis not present

## 2015-03-22 DIAGNOSIS — I2510 Atherosclerotic heart disease of native coronary artery without angina pectoris: Secondary | ICD-10-CM | POA: Diagnosis not present

## 2015-03-22 DIAGNOSIS — Z590 Homelessness: Secondary | ICD-10-CM | POA: Diagnosis not present

## 2015-03-22 LAB — I-STAT CHEM 8, ED
BUN: 20 mg/dL (ref 6–23)
CREATININE: 1.1 mg/dL (ref 0.50–1.35)
Calcium, Ion: 1 mmol/L — ABNORMAL LOW (ref 1.13–1.30)
Chloride: 111 mmol/L (ref 96–112)
Glucose, Bld: 94 mg/dL (ref 70–99)
HEMATOCRIT: 47 % (ref 39.0–52.0)
HEMOGLOBIN: 16 g/dL (ref 13.0–17.0)
POTASSIUM: 5 mmol/L (ref 3.5–5.1)
SODIUM: 141 mmol/L (ref 135–145)
TCO2: 13 mmol/L (ref 0–100)

## 2015-03-22 MED ORDER — SODIUM BICARBONATE 8.4 % IV SOLN
INTRAVENOUS | Status: AC | PRN
  Administered 2015-03-22: 50 meq via INTRAVENOUS

## 2015-03-22 MED ORDER — ATROPINE SULFATE 1 MG/ML IJ SOLN
INTRAMUSCULAR | Status: AC | PRN
  Administered 2015-03-22: 1 mg via INTRAVENOUS

## 2015-03-22 MED ORDER — EPINEPHRINE HCL 0.1 MG/ML IJ SOSY
PREFILLED_SYRINGE | INTRAMUSCULAR | Status: AC | PRN
  Administered 2015-03-22 (×3): 1 via INTRAVENOUS

## 2015-03-22 MED FILL — Medication: Qty: 1 | Status: AC

## 2015-03-24 NOTE — Code Documentation (Addendum)
CPR stopped. Dr. Manus Gunningancour at bedside using ultrasound. V-TACH noted on monitor, pt shocked x1.

## 2015-03-24 NOTE — Progress Notes (Signed)
   11-20-2015 1600  Clinical Encounter Type  Visited With Patient;Patient and family together;Health care provider  Visit Type Initial;Death;ED   Chaplain was paged to the ED earlier this afternoon and notified that the patient passed away post-CPR. Chaplain was needed to contact family. Chaplain was able to contact the patient's brother via phone who said that the patient's son was en route to the hospital. Patient's son arrived to the hospital around 40 minutes later. Chaplain escorted patient's son and his wife to a consultation room and was present while a nurse and the physician consulted with the patient's son. Patient's son is devastated. Patient was found unresponsive after mowing grass. Patient's son seemed upset by this and noted that he should not have been mowing grass anymore with his weak heart. Patient's son also said he had argued with his father about this. Chaplain provided grief support during this time and helped the patient's son get to the patient's bedside. Patient's son has no funeral arrangements at this time and is trying to figure out if any more family will be able to get to the hospital. Chaplain encouraged the patient's son to spend as much time as he needed with the patient. Before chaplain left chaplain provided patient's son with a patient placement card so that he can communicate funeral arrangements. Page on-call chaplain if further support needed this evening. Abbeygail Igoe, Tommi EmeryBlake R, Chaplain  4:45 PM

## 2015-03-24 NOTE — ED Notes (Signed)
Jeans, belt, tennis shoes, yellow shirt, green jacket, black work gloves and black hat at bedside. Family en route to hospital at the time.

## 2015-03-24 NOTE — Code Documentation (Signed)
V-TACH noted on cardiac monitor. Shocked x2 per Dr. Manus Gunningancour. CPR continued.

## 2015-03-24 NOTE — Code Documentation (Signed)
CPR stopped, pulse check performed, PEA on cardiac monitor. CPR continued.

## 2015-03-24 NOTE — Code Documentation (Signed)
CPR stopped, PEA on cardiac monitor. CPR discontinued, no cardiac activity noted on ultrasound. Time of death 14:40 per Dr. Manus Gunningancour.

## 2015-03-24 NOTE — Code Documentation (Signed)
CPR stopped, V-TACH on cardiac monitor, pt shocked x2. CPR continued.

## 2015-03-24 NOTE — Code Documentation (Addendum)
CPR stopped, pulse check performed, unable to palpate pulses, PEA noted on cardiac monitor. CPR continued.

## 2015-03-24 NOTE — ED Provider Notes (Signed)
CSN: [id number]     Arrival date & time 03/29/2015  1425 History   First MD Initiated Contact with Patient 29-Mar-2015 1448     Chief Complaint  Patient presents with  . Cardiac Arrest     (Consider location/radiation/quality/duration/timing/severity/associated sxs/prior Treatment) HPI Comments: CPR in progress. Patient was mowing a lawn when his a bystander found him collapsed. No bystander CPR performed. Fire department started CPR initial rhythm for EMS was PEA. Patient given multiple rounds of epinephrine. He had progressed to Vfib and was shocked four times.  He has received CPR for 45 minutes and gotten 6 mg epinephrine, 450 mg amiodarone. Intubated by EMS. Hx cardiac arrest previously.  The history is provided by the EMS personnel. The history is limited by the condition of the patient.    Past Medical History  Diagnosis Date  . CAD (coronary artery disease)     a. LHC in setting of VF arrest 11/15 >> high grade RCA tx with POBA;  b. LHC in setting of VF arrest 1/16:  calc LAD, mLAD 50, calc LM, calc RI, dRCA 95-99, oPDA 90, inf-basal aneurysm, EF 25%, inf wall not viable>>med Rx  . Ischemic cardiomyopathy     a.  echo 1/16:  EF 25-30%, PASP 34 mmHg  . Ventricular fibrillation     a. s/p VF arrest 10/2014 and 12/2014 >> 2/2 inf wall aneurysmal segment (substrate for arrhythmia);  b. Amiodarone started 12/2014;  c. not an ICD candidate  . Chronic systolic CHF (congestive heart failure)   . HTN (hypertension)   . HLD (hyperlipidemia)   . Polysubstance abuse     hx of cocaine  . Homelessness     hx of prior homelessness - now living in housing  . Psychiatric disorder     according to notes; previously on seroquel, depakote  . Atrial fibrillation     paroxysmal; noted during admxn with VF arrest in 1/16 >> not an anticoag candidate;  on Amiodarone   Past Surgical History  Procedure Laterality Date  . Left heart catheterization with coronary angiogram N/A 10/27/2014    Procedure: LEFT  HEART CATHETERIZATION WITH CORONARY ANGIOGRAM;  Surgeon: Kathleene Hazel, MD;  Location: [facility] CATH LAB;  Service: Cardiovascular;  Laterality: N/A;  . Left heart catheterization with coronary angiogram N/A 01/13/2015    Procedure: LEFT HEART CATHETERIZATION WITH CORONARY ANGIOGRAM;  Surgeon: Lesleigh Noe, MD;  Location: [facility] CATH LAB;  Service: Cardiovascular;  Laterality: N/A;   Family History  Problem Relation Age of Onset  . Anemia    . Arrhythmia    . Asthma    . Clotting disorder    . Fainting    . Heart attack    . Heart disease    . Heart failure    . Hyperlipidemia    . Hypertension    . Hypertension    . Stroke Mother   . Hypertension Brother    History  Substance Use Topics  . Smoking status: Current Every Day Smoker -- 1.00 packs/day for 35 years    Types: Cigarettes  . Smokeless tobacco: Not on file  . Alcohol Use: Not on file    Review of Systems  Unable to perform ROS: Patient unresponsive      Allergies  Aspirin  Home Medications   Prior to Admission medications   Medication Sig Start Date End Date Taking? Authorizing Provider  amiodarone (PACERONE) 200 MG tablet  01/19/15   Historical Provider, MD  amiodarone (PACERONE)  400 MG tablet Take 1 tablet (400 mg total) by mouth 2 (two) times daily. 01/18/15   Maryann Mikhail, DO  aspirin 81 MG chewable tablet Chew 1 tablet (81 mg total) by mouth daily. 01/18/15   Maryann Mikhail, DO  atorvastatin (LIPITOR) 80 MG tablet Take 1 tablet (80 mg total) by mouth daily at 6 PM. 01/18/15   Maryann Mikhail, DO  carvedilol (COREG) 12.5 MG tablet Take 1 tablet (12.5 mg total) by mouth 2 (two) times daily with a meal. 01/18/15   Maryann Mikhail, DO  clopidogrel (PLAVIX) 75 MG tablet Take 1 tablet (75 mg total) by mouth daily. 01/18/15   Maryann Mikhail, DO  feeding supplement, ENSURE COMPLETE, (ENSURE COMPLETE) LIQD Take 237 mLs by mouth 2 (two) times daily between meals. 01/18/15   Maryann Mikhail, DO  furosemide (LASIX)  20 MG tablet Take 1 tablet (20 mg total) by mouth daily. 02/09/15   Beatrice Lecher, PA-C  GUAIFENESIN AC 100-10 MG/5ML syrup Take 5 mLs by mouth 3 (three) times daily as needed for cough or congestion.  01/27/15   Historical Provider, MD  levofloxacin (LEVAQUIN) 750 MG tablet Take 750 mg by mouth daily. 01/27/15   Historical Provider, MD  potassium chloride (K-DUR) 10 MEQ tablet Take 2 tablets (20 mEq total) by mouth daily. 02/10/15   Scott T Weaver, PA-C  PROAIR HFA 108 (90 BASE) MCG/ACT inhaler Inhale 1-2 puffs into the lungs every 4 (four) hours as needed.  01/27/15   Historical Provider, MD   Pulse 0  Wt 150 lb (68.04 kg) Physical Exam  Constitutional: He appears well-developed and well-nourished.  Unresponsive, a few agonal respirations  HENT:  Head: Normocephalic and atraumatic.  Eyes:  Fixed and dilated  Cardiovascular:  Pulses intact with compressions  Pulmonary/Chest: No respiratory distress.  Equal breath sounds with bagging  Abdominal: Soft. There is no tenderness.  Musculoskeletal:  IO left tibia  Neurological:  GCS 3, unresponsive    ED Course  Procedures (including critical care time) Labs Review Labs Reviewed  I-STAT CHEM 8, ED - Abnormal; Notable for the following:    Calcium, Ion 1.00 (*)    All other components within normal limits    Imaging Review No results found.   EKG Interpretation None      MDM   Final diagnoses:  Cardiac arrest   Unwitnessed cardiac arrest with prolonged downtime. EMS CPR for 45 minutes. Status post 6 rounds of epinephrine and 450 mg amiodarone. Status post 4 shocks for VT.  Airway confirmed on arrival.  ETT through vocal cords. Patient PEA at a rate of 40. Agonal contractions on ultrasound. CPR continued additional epinephrine, atropine and bicarbonate given.  CPR continued for 15 minutes after arrival. No perfusable rhythm seen. Slow PEA. Patient did have one episode of ventricular tachycardia in the ED and was  defibrillated.  No perfusable rhythm after 1 hour of CPR and multiple medications. Rhythm has degraded to asystole. Patient with no viable contractions on echocardiogram. Code called at 1440.  D/w Dr. Shirlee Latch of cardiology who is listed as patient's primary cardiologist. He states patient has seen Dr. Eden Emms in the past but he is on vacation. Patient does not have a PCP. Dr. Shirlee Latch states to send death certificate to cardiology office.  Son Jannifer Hick updated.   CRITICAL CARE Performed by: Glynn Octave Total critical care time: 40 Critical care time was exclusive of separately billable procedures and treating other patients. Critical care was necessary to treat or prevent imminent  or life-threatening deterioration. Critical care was time spent personally by me on the following activities: development of treatment plan with patient and/or surrogate as well as nursing, discussions with consultants, evaluation of patient's response to treatment, examination of patient, obtaining history from patient or surrogate, ordering and performing treatments and interventions, ordering and review of laboratory studies, ordering and review of radiographic studies, pulse oximetry and re-evaluation of patient's condition.   Cardiopulmonary Resuscitation (CPR) Procedure Note Directed/Performed by: Glynn OctaveANCOUR, Daleisa Halperin I personally directed ancillary staff and/or performed CPR in an effort to regain return of spontaneous circulation and to maintain cardiac, neuro and systemic perfusion.      EMERGENCY DEPARTMENT US CARDIAC EXAM "Study: Limited Ultrasound of the heart and pericardium"  INDICATIONS:Cardiac arrest Multiple views of the heart and pericardium are obtained with a multi-frequency probe.  PERFORMED UU:VOZDGUBY:Myself  IMAGES ARCHIVED?: Yes  FINDINGS: No pericardial effusion, Decreased contractility, Tamponade physiology absent and Agonal contractions noted  LIMITATIONS:  Emergent procedure  VIEWS  USED: Subcostal 4 chamber  INTERPRETATION: Cardiac activity absent, Pericardial effusioin absent, Cardiac tamponade absent and Decreased contractility  COMMENT:      Glynn OctaveStephen Jamey Harman, MD 2015/11/22 [id number]

## 2015-03-24 NOTE — ED Notes (Signed)
All belongings given to son at bedside, son also given card to call bed control regarding funeral home placement. Pt transported to morgue.

## 2015-03-24 NOTE — Code Documentation (Addendum)
Patient was outside mowing yard, history of heart problems, neighbors heard lawnmower turn off and found him laying in yard unresponsive. PEA noted upon EMS arrival. Shocked x4, received 450mg  Amiodarone, and x6 Epinephrine.

## 2015-03-24 DEATH — deceased

## 2015-03-28 ENCOUNTER — Telehealth: Payer: Self-pay | Admitting: Cardiology

## 2015-03-28 NOTE — Telephone Encounter (Signed)
[facility] sent interoffice to [facility]F Clinic for Dr.McLean to Sign.

## 2016-11-01 IMAGING — CR DG CHEST 1V PORT
2 series · 2 of 2 positions shown · non-contrast
Comparison: 01/09/2015 and 07/20/2009.

CLINICAL DATA: PICC/central line placement.  Shortness of breath.

EXAM:
PORTABLE CHEST - 1 VIEW

[AP (1 of 2)]
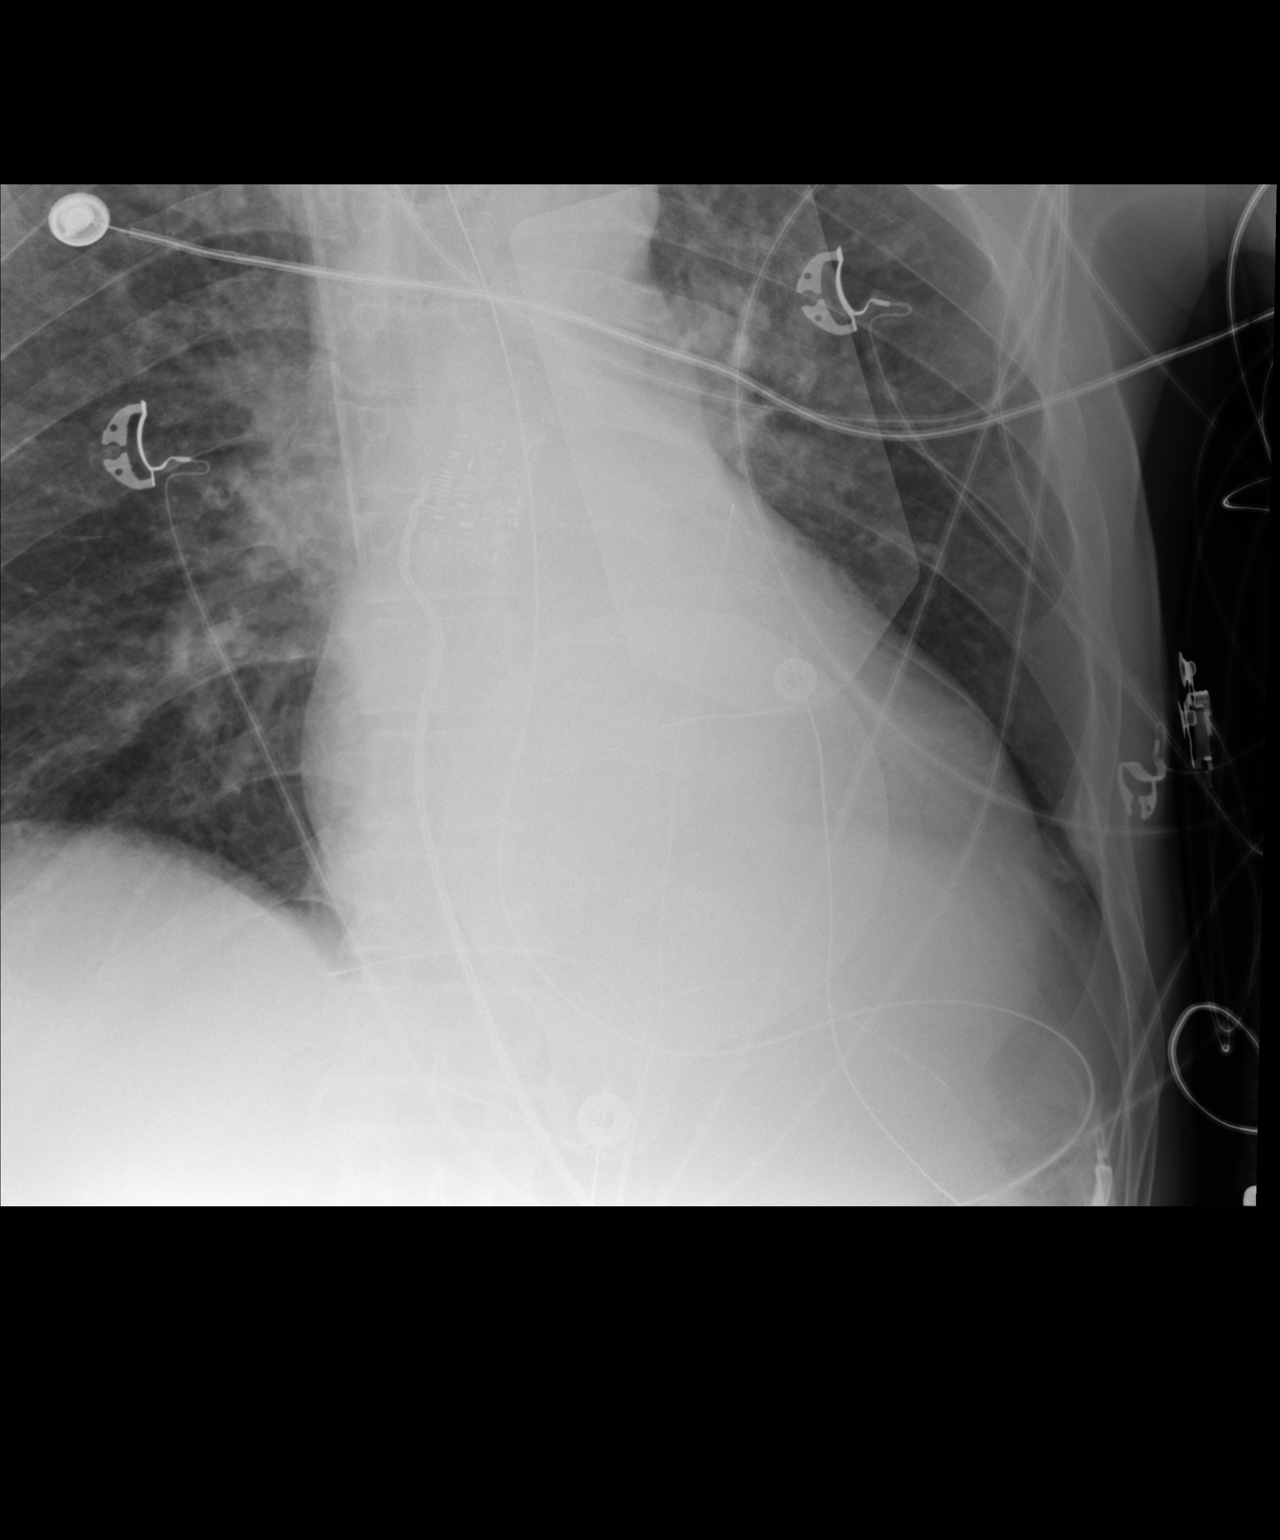

[AP (2 of 2)]
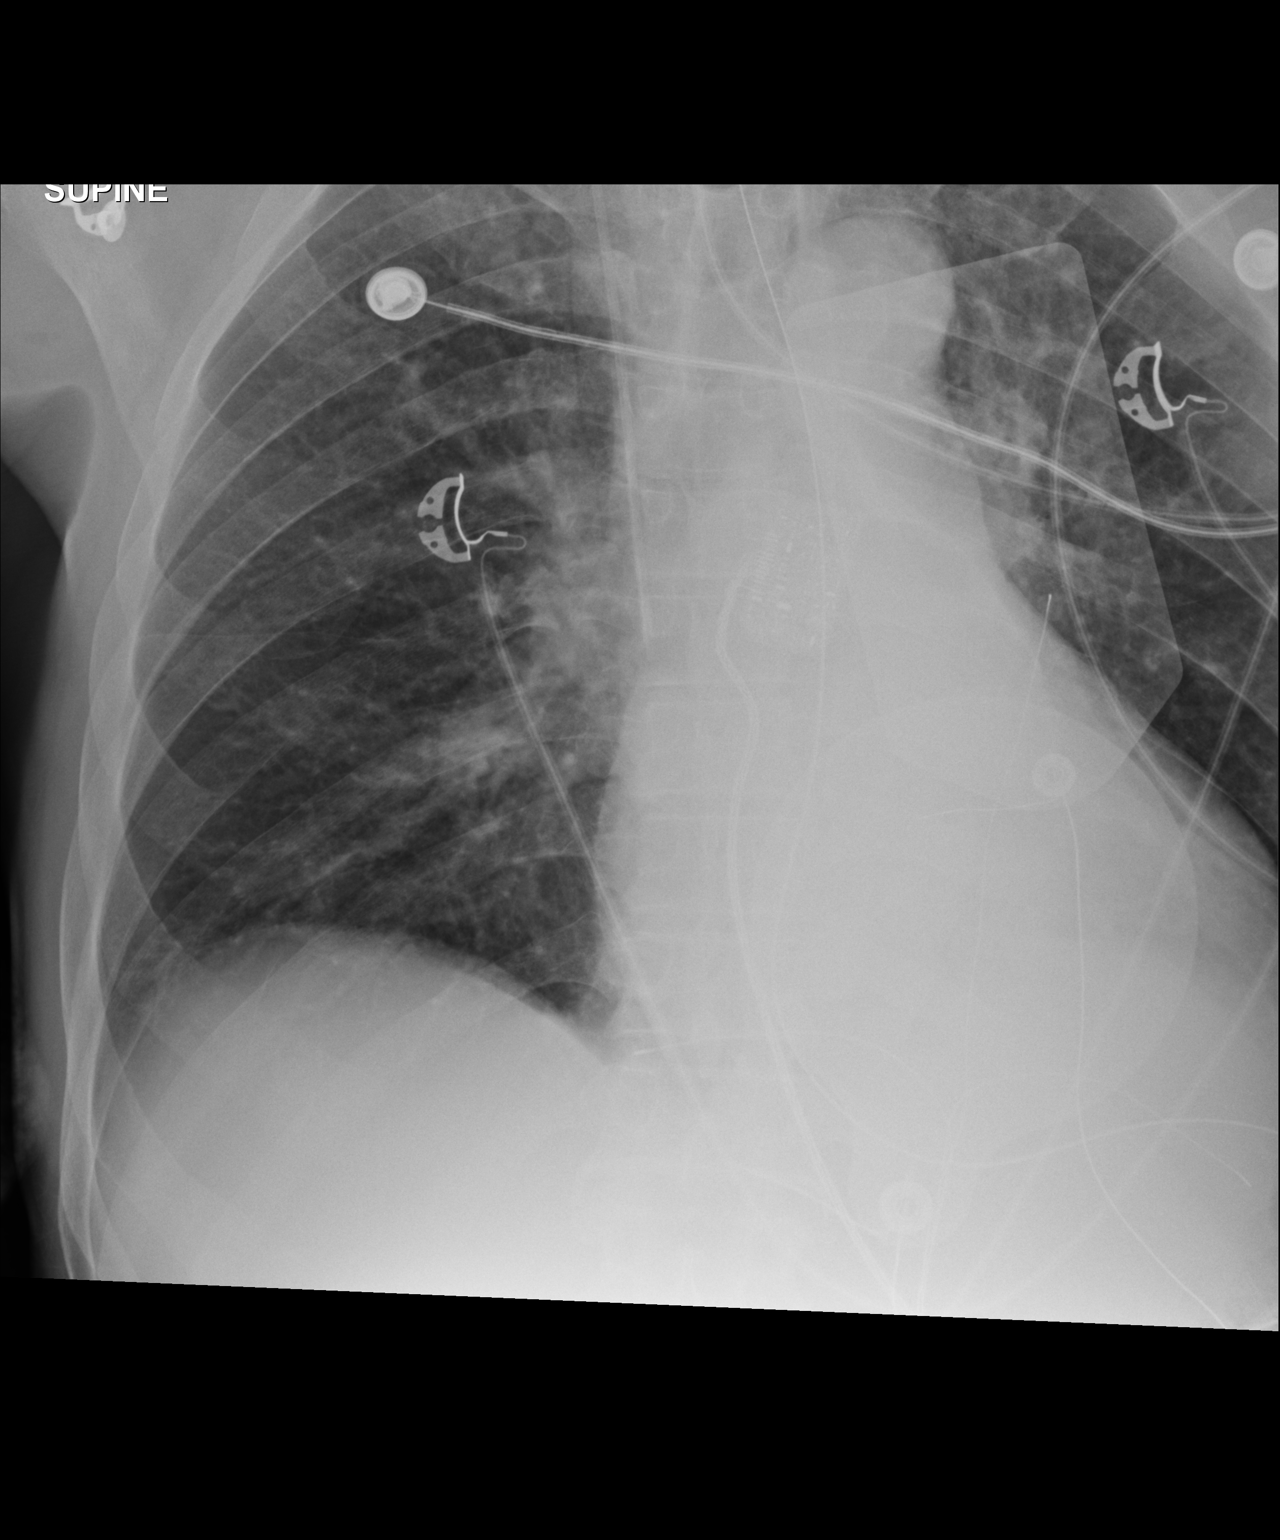

[2 of 2 positions shown; findings below may reference images not displayed]

FINDINGS: Endotracheal tube with tip 7.6 cm above the carina. Enteric tube
courses into the region of the stomach and off the film as tip is
not visualized. Right IJ central venous catheter has tip over the
SVC. External defibrillator over the left chest. Lungs are
adequately inflated without consolidation or effusion. Minimal
stable prominence of the central vascular markings.
Cardiomediastinal silhouette and remainder the exam is unchanged.
There is no evidence of right-sided pneumothorax.
IMPRESSION: Suggestion mild vascular congestion.

Tubes and lines as described.
